# Patient Record
Sex: Male | Born: 1940 | Race: White | Hispanic: No | Smoking: Former smoker
Health system: Southern US, Community
[De-identification: ages and names within clinical notes are randomized; demographics above are authoritative.]

## PROBLEM LIST (undated history)

## (undated) DIAGNOSIS — N189 Chronic kidney disease, unspecified: Secondary | ICD-10-CM

## (undated) DIAGNOSIS — G4700 Insomnia, unspecified: Secondary | ICD-10-CM

## (undated) DIAGNOSIS — E039 Hypothyroidism, unspecified: Secondary | ICD-10-CM

## (undated) DIAGNOSIS — I10 Essential (primary) hypertension: Secondary | ICD-10-CM

## (undated) DIAGNOSIS — F419 Anxiety disorder, unspecified: Secondary | ICD-10-CM

## (undated) DIAGNOSIS — E785 Hyperlipidemia, unspecified: Secondary | ICD-10-CM

## (undated) DIAGNOSIS — I2510 Atherosclerotic heart disease of native coronary artery without angina pectoris: Secondary | ICD-10-CM

## (undated) DIAGNOSIS — I499 Cardiac arrhythmia, unspecified: Secondary | ICD-10-CM

## (undated) DIAGNOSIS — I219 Acute myocardial infarction, unspecified: Secondary | ICD-10-CM

## (undated) DIAGNOSIS — E079 Disorder of thyroid, unspecified: Secondary | ICD-10-CM

## (undated) HISTORY — DX: Insomnia, unspecified: G47.00

## (undated) HISTORY — PX: BACK SURGERY: SHX140

## (undated) HISTORY — DX: Chronic kidney disease, unspecified: N18.9

## (undated) HISTORY — DX: Hyperlipidemia, unspecified: E78.5

## (undated) HISTORY — DX: Acute myocardial infarction, unspecified: I21.9

## (undated) HISTORY — PX: CARDIAC CATHETERIZATION: SHX172

## (undated) HISTORY — DX: Anxiety disorder, unspecified: F41.9

## (undated) HISTORY — PX: CORONARY ANGIOPLASTY: SHX604

## (undated) HISTORY — DX: Disorder of thyroid, unspecified: E07.9

## (undated) HISTORY — PX: CORONARY STENT PLACEMENT: SHX1402

## (undated) HISTORY — DX: Essential (primary) hypertension: I10

---

## 2005-01-07 ENCOUNTER — Ambulatory Visit: Payer: Self-pay | Admitting: Ophthalmology

## 2005-06-25 ENCOUNTER — Ambulatory Visit: Payer: Self-pay | Admitting: Unknown Physician Specialty

## 2008-09-26 ENCOUNTER — Ambulatory Visit: Payer: Self-pay | Admitting: Neurological Surgery

## 2009-12-20 ENCOUNTER — Emergency Department: Payer: Self-pay | Admitting: Emergency Medicine

## 2010-04-23 ENCOUNTER — Ambulatory Visit: Payer: Self-pay | Admitting: Unknown Physician Specialty

## 2010-04-23 LAB — HM COLONOSCOPY

## 2010-10-02 ENCOUNTER — Ambulatory Visit: Payer: Self-pay | Admitting: Family Medicine

## 2010-10-09 ENCOUNTER — Ambulatory Visit: Payer: Self-pay

## 2013-01-05 ENCOUNTER — Ambulatory Visit: Payer: Self-pay | Admitting: Ophthalmology

## 2014-02-27 ENCOUNTER — Ambulatory Visit: Payer: Self-pay

## 2014-03-01 ENCOUNTER — Ambulatory Visit: Payer: Self-pay

## 2014-08-02 DIAGNOSIS — I129 Hypertensive chronic kidney disease with stage 1 through stage 4 chronic kidney disease, or unspecified chronic kidney disease: Secondary | ICD-10-CM | POA: Diagnosis not present

## 2014-08-02 DIAGNOSIS — E1129 Type 2 diabetes mellitus with other diabetic kidney complication: Secondary | ICD-10-CM | POA: Diagnosis not present

## 2014-08-02 LAB — PSA

## 2014-11-29 ENCOUNTER — Other Ambulatory Visit: Payer: Self-pay | Admitting: Unknown Physician Specialty

## 2014-11-29 DIAGNOSIS — E7800 Pure hypercholesterolemia, unspecified: Secondary | ICD-10-CM

## 2014-11-29 MED ORDER — ATORVASTATIN CALCIUM 40 MG PO TABS: 40.0000 mg | ORAL_TABLET | Freq: Every day | ORAL | Status: DC

## 2014-12-12 ENCOUNTER — Other Ambulatory Visit: Payer: Self-pay | Admitting: Unknown Physician Specialty

## 2015-02-13 ENCOUNTER — Other Ambulatory Visit: Payer: Self-pay | Admitting: Unknown Physician Specialty

## 2015-04-02 ENCOUNTER — Other Ambulatory Visit: Payer: Self-pay | Admitting: Unknown Physician Specialty

## 2015-04-17 ENCOUNTER — Other Ambulatory Visit: Payer: Self-pay | Admitting: Unknown Physician Specialty

## 2015-04-23 ENCOUNTER — Other Ambulatory Visit: Payer: Self-pay | Admitting: Unknown Physician Specialty

## 2015-04-23 NOTE — Telephone Encounter (Signed)
Needs an appointment. Will get him enough medicine to make it to appointment when it's booked.   

## 2015-04-23 NOTE — Telephone Encounter (Signed)
Called and scheduled patient an appointment for 05/01/15.

## 2015-04-30 DIAGNOSIS — F419 Anxiety disorder, unspecified: Secondary | ICD-10-CM

## 2015-04-30 DIAGNOSIS — I129 Hypertensive chronic kidney disease with stage 1 through stage 4 chronic kidney disease, or unspecified chronic kidney disease: Secondary | ICD-10-CM | POA: Insufficient documentation

## 2015-04-30 DIAGNOSIS — E039 Hypothyroidism, unspecified: Secondary | ICD-10-CM | POA: Insufficient documentation

## 2015-04-30 DIAGNOSIS — N1831 Chronic kidney disease, stage 3a: Secondary | ICD-10-CM | POA: Insufficient documentation

## 2015-04-30 DIAGNOSIS — E785 Hyperlipidemia, unspecified: Secondary | ICD-10-CM

## 2015-04-30 DIAGNOSIS — E119 Type 2 diabetes mellitus without complications: Secondary | ICD-10-CM | POA: Insufficient documentation

## 2015-04-30 DIAGNOSIS — N183 Chronic kidney disease, stage 3 unspecified: Secondary | ICD-10-CM

## 2015-04-30 DIAGNOSIS — I10 Essential (primary) hypertension: Secondary | ICD-10-CM

## 2015-04-30 DIAGNOSIS — E1122 Type 2 diabetes mellitus with diabetic chronic kidney disease: Secondary | ICD-10-CM

## 2015-04-30 DIAGNOSIS — G4700 Insomnia, unspecified: Secondary | ICD-10-CM

## 2015-05-01 ENCOUNTER — Encounter: Payer: Self-pay | Admitting: Unknown Physician Specialty

## 2015-05-01 ENCOUNTER — Ambulatory Visit (INDEPENDENT_AMBULATORY_CARE_PROVIDER_SITE_OTHER): Payer: Medicare Other | Admitting: Unknown Physician Specialty

## 2015-05-01 VITALS — BP 175/83 | HR 63 | Temp 98.2°F | Ht 66.5 in | Wt 158.0 lb

## 2015-05-01 DIAGNOSIS — N182 Chronic kidney disease, stage 2 (mild): Secondary | ICD-10-CM | POA: Diagnosis not present

## 2015-05-01 DIAGNOSIS — E785 Hyperlipidemia, unspecified: Secondary | ICD-10-CM | POA: Diagnosis not present

## 2015-05-01 DIAGNOSIS — I10 Essential (primary) hypertension: Secondary | ICD-10-CM | POA: Diagnosis not present

## 2015-05-01 DIAGNOSIS — Z0000 Encounter for general adult medical examination without abnormal findings: Secondary | ICD-10-CM | POA: Diagnosis not present

## 2015-05-01 DIAGNOSIS — N189 Chronic kidney disease, unspecified: Secondary | ICD-10-CM | POA: Insufficient documentation

## 2015-05-01 DIAGNOSIS — E1122 Type 2 diabetes mellitus with diabetic chronic kidney disease: Secondary | ICD-10-CM | POA: Diagnosis not present

## 2015-05-01 LAB — BAYER DCA HB A1C WAIVED: HB A1C (BAYER DCA - WAIVED): 6.1 % (ref <7.0)

## 2015-05-01 LAB — LIPID PANEL PICCOLO, WAIVED
CHOLESTEROL PICCOLO, WAIVED: 185 mg/dL (ref <200)
Chol/HDL Ratio Piccolo,Waive: 2.8 mg/dL
HDL Chol Piccolo, Waived: 65 mg/dL (ref >59)
LDL CHOL CALC PICCOLO WAIVED: 98 mg/dL (ref <100)
Triglycerides Piccolo,Waived: 111 mg/dL (ref <150)
VLDL Chol Calc Piccolo,Waive: 22 mg/dL (ref <30)

## 2015-05-01 LAB — MICROALBUMIN, URINE WAIVED
Creatinine, Urine Waived: 50 mg/dL (ref 10–300)
MICROALB, UR WAIVED: 30 mg/L — AB (ref 0–19)

## 2015-05-01 MED ORDER — AMLODIPINE BESYLATE 5 MG PO TABS: 5.0000 mg | ORAL_TABLET | Freq: Every day | ORAL | Status: DC

## 2015-05-01 MED ORDER — ATORVASTATIN CALCIUM 40 MG PO TABS: 40.0000 mg | ORAL_TABLET | Freq: Every day | ORAL | Status: DC

## 2015-05-01 MED ORDER — HYDROCHLOROTHIAZIDE 25 MG PO TABS: 25.0000 mg | ORAL_TABLET | Freq: Every day | ORAL | Status: DC

## 2015-05-01 MED ORDER — LISINOPRIL 40 MG PO TABS: 40.0000 mg | ORAL_TABLET | Freq: Every day | ORAL | Status: DC

## 2015-05-01 NOTE — Assessment & Plan Note (Signed)
Reviewed lipid panel.  LDL was 97.  Continue present medications.

## 2015-05-01 NOTE — Assessment & Plan Note (Signed)
Check CMP.  ?

## 2015-05-01 NOTE — Patient Instructions (Signed)
Start HCTZ 25 mg for your BP

## 2015-05-01 NOTE — Progress Notes (Signed)
BP 175/83 mmHg  Pulse 63  Temp(Src) 98.2 F (36.8 C)  Ht 5' 6.5" (1.689 m)  Wt 158 lb (71.668 kg)  BMI 25.12 kg/m2  SpO2 99%   Subjective:    Patient ID: Roger Byrd, male    DOB: 04/07/1941, 74 y.o.   MRN: [id number]  HPI: Roger Byrd is a 74 y.o. male  Chief Complaint  Patient presents with  . Hyperlipidemia  . Hypertension  . Medication Refill    pt states he needs all medications refilled   Diabetes:  Using medications without difficulties  Hypoglycemic episodes: none Hyperglycemic episodes: none Feet problems: none Blood Sugars averaging: not checking Eye exam 1-2 years ago  Hypertension:  Poor control today  Using medication without problems or lightheadedness Chest pain with exertion: no Edema: no Short of breath: no Monitoring BP at home?:no  Elevated Cholesterol: Using medications without problems Muscle aches: none Diet compliance:none Exercise: none Supplements?   Due for colonoscopy    Relevant past medical, surgical, family and social history reviewed and updated as indicated. Interim medical history since our last visit reviewed. Allergies and medications reviewed and updated.  Review of Systems  Constitutional: Negative.   HENT: Negative.   Eyes: Negative.   Respiratory: Negative.   Cardiovascular: Negative.   Gastrointestinal: Negative.   Endocrine: Negative.   Genitourinary: Negative.   Skin: Negative.   Allergic/Immunologic: Negative.   Neurological: Negative.   Hematological: Negative.   Psychiatric/Behavioral: Negative.     Per HPI unless specifically indicated above     Objective:    BP 175/83 mmHg  Pulse 63  Temp(Src) 98.2 F (36.8 C)  Ht 5' 6.5" (1.689 m)  Wt 158 lb (71.668 kg)  BMI 25.12 kg/m2  SpO2 99%  Wt Readings from Last 3 Encounters:  05/01/15 158 lb (71.668 kg)  08/02/14 162 lb (73.483 kg)    Physical Exam  Constitutional: He is oriented to person, place, and time. He appears well-developed  and well-nourished.  HENT:  Head: Normocephalic.  Eyes: Pupils are equal, round, and reactive to light.  Cardiovascular: Normal rate, regular rhythm and normal heart sounds.   Pulmonary/Chest: Effort normal and breath sounds normal.  Musculoskeletal: Normal range of motion.  Neurological: He is alert and oriented to person, place, and time. He has normal reflexes.  Skin: Skin is warm and dry.  Psychiatric: He has a normal mood and affect. His behavior is normal. Judgment and thought content normal.    Results for orders placed or performed in visit on 04/30/15  PSA  Result Value Ref Range   PSA from PP   HM COLONOSCOPY  Result Value Ref Range   HM Colonoscopy from PP       Assessment & Plan:   Problem List Items Addressed This Visit      Unprioritized   Diabetes mellitus ([place]) - Primary   Relevant Medications   atorvastatin (LIPITOR) 40 MG tablet   lisinopril (PRINIVIL,ZESTRIL) 40 MG tablet   Other Relevant Orders   Bayer DCA Hb A1c Waived   Comprehensive metabolic panel   Hypertension    Add HCTZ 25 mg      Relevant Medications   amLODipine (NORVASC) 5 MG tablet   atorvastatin (LIPITOR) 40 MG tablet   lisinopril (PRINIVIL,ZESTRIL) 40 MG tablet   hydrochlorothiazide (HYDRODIURIL) 25 MG tablet   Other Relevant Orders   Microalbumin, Urine Waived   Uric acid   Comprehensive metabolic panel   Hyperlipidemia    Reviewed lipid  panel.  LDL was 97.  Continue present medications.         Relevant Medications   amLODipine (NORVASC) 5 MG tablet   atorvastatin (LIPITOR) 40 MG tablet   lisinopril (PRINIVIL,ZESTRIL) 40 MG tablet   hydrochlorothiazide (HYDRODIURIL) 25 MG tablet   Other Relevant Orders   Lipid Panel Piccolo, Waived   CKD (chronic kidney disease)    Check CMP      Relevant Orders   Comprehensive metabolic panel    Other Visit Diagnoses    Health care maintenance        Relevant Orders    Ambulatory referral to Gastroenterology        Follow up  plan: Return in about 4 weeks (around 05/29/2015) for BP.

## 2015-05-01 NOTE — Assessment & Plan Note (Signed)
Add HCTZ 25mg

## 2015-05-02 LAB — COMPREHENSIVE METABOLIC PANEL
A/G RATIO: 2 (ref 1.1–2.5)
ALBUMIN: 4.4 g/dL (ref 3.5–4.8)
ALK PHOS: 61 IU/L (ref 39–117)
ALT: 18 IU/L (ref 0–44)
AST: 18 IU/L (ref 0–40)
BILIRUBIN TOTAL: 0.7 mg/dL (ref 0.0–1.2)
BUN / CREAT RATIO: 14 (ref 10–22)
BUN: 16 mg/dL (ref 8–27)
CO2: 24 mmol/L (ref 18–29)
CREATININE: 1.11 mg/dL (ref 0.76–1.27)
Calcium: 9.4 mg/dL (ref 8.6–10.2)
Chloride: 101 mmol/L (ref 97–106)
GFR calc Af Amer: 76 mL/min/{1.73_m2} (ref >59)
GFR calc non Af Amer: 66 mL/min/{1.73_m2} (ref >59)
GLOBULIN, TOTAL: 2.2 g/dL (ref 1.5–4.5)
Glucose: 117 mg/dL — ABNORMAL HIGH (ref 65–99)
POTASSIUM: 5.1 mmol/L (ref 3.5–5.2)
SODIUM: 140 mmol/L (ref 136–144)
Total Protein: 6.6 g/dL (ref 6.0–8.5)

## 2015-05-02 LAB — URIC ACID: Uric Acid: 4.9 mg/dL (ref 3.7–8.6)

## 2015-05-19 ENCOUNTER — Emergency Department
Admission: EM | Admit: 2015-05-19 | Discharge: 2015-05-19 | Disposition: A | Discharge status: Home / Self Care | Payer: Medicare Other | Attending: Emergency Medicine | Admitting: Emergency Medicine

## 2015-05-19 ENCOUNTER — Encounter: Payer: Self-pay | Admitting: Emergency Medicine

## 2015-05-19 DIAGNOSIS — W260XXA Contact with knife, initial encounter: Secondary | ICD-10-CM | POA: Insufficient documentation

## 2015-05-19 DIAGNOSIS — Z87891 Personal history of nicotine dependence: Secondary | ICD-10-CM | POA: Insufficient documentation

## 2015-05-19 DIAGNOSIS — Y9289 Other specified places as the place of occurrence of the external cause: Secondary | ICD-10-CM | POA: Diagnosis not present

## 2015-05-19 DIAGNOSIS — I129 Hypertensive chronic kidney disease with stage 1 through stage 4 chronic kidney disease, or unspecified chronic kidney disease: Secondary | ICD-10-CM | POA: Insufficient documentation

## 2015-05-19 DIAGNOSIS — E785 Hyperlipidemia, unspecified: Secondary | ICD-10-CM | POA: Insufficient documentation

## 2015-05-19 DIAGNOSIS — S61217A Laceration without foreign body of left little finger without damage to nail, initial encounter: Secondary | ICD-10-CM | POA: Diagnosis not present

## 2015-05-19 DIAGNOSIS — Z7982 Long term (current) use of aspirin: Secondary | ICD-10-CM | POA: Insufficient documentation

## 2015-05-19 DIAGNOSIS — Y998 Other external cause status: Secondary | ICD-10-CM | POA: Diagnosis not present

## 2015-05-19 DIAGNOSIS — E119 Type 2 diabetes mellitus without complications: Secondary | ICD-10-CM | POA: Diagnosis not present

## 2015-05-19 DIAGNOSIS — N189 Chronic kidney disease, unspecified: Secondary | ICD-10-CM | POA: Insufficient documentation

## 2015-05-19 DIAGNOSIS — Z79899 Other long term (current) drug therapy: Secondary | ICD-10-CM | POA: Insufficient documentation

## 2015-05-19 DIAGNOSIS — Y9389 Activity, other specified: Secondary | ICD-10-CM | POA: Diagnosis not present

## 2015-05-19 MED ORDER — TRAMADOL HCL 50 MG PO TABS: 50.0000 mg | ORAL_TABLET | Freq: Four times a day (QID) | ORAL | Status: DC | PRN

## 2015-05-19 MED ORDER — LIDOCAINE HCL (PF) 1 % IJ SOLN
INTRAMUSCULAR | Status: AC
  Filled 2015-05-19: qty 5

## 2015-05-19 MED ORDER — MICONAZOLE NITRATE 2 % EX CREA: TOPICAL_CREAM | Freq: Once | CUTANEOUS | Status: DC

## 2015-05-19 MED ORDER — BACITRACIN-NEOMYCIN-POLYMYXIN 400-5-5000 EX OINT
TOPICAL_OINTMENT | Freq: Once | CUTANEOUS | Status: AC
  Administered 2015-05-19: 1 via TOPICAL
  Filled 2015-05-19: qty 1

## 2015-05-19 NOTE — ED Notes (Signed)
Unable to stop bleeding at home, now has dressing with no bleed through

## 2015-05-19 NOTE — ED Provider Notes (Signed)
[facility] Emergency Department Provider Note  ____________________________________________  Time seen: Approximately 10:56 AM  I have reviewed the triage vital signs and the nursing notes.   HISTORY  Chief Complaint Laceration    HPI Roger Byrd is a 74 y.o. male issues with a knife cut to the dorsal aspect of the fifth digit left hand. Patient stated he was unable to control the bleeding with direct pressure went to the urgent care clinic date also unable to stop the bleeding. Patient states no prescription blood thinner but does take 81 mg aspirin daily. Patient's rate is pain discomfort as a 1/10. Denies any loss of sensation or loss of function. Pressure dressing was applied at the urgent care clinic. Patient's tetanus shot is up-to-date. She is right-hand dominant. Past Medical History  Diagnosis Date  . Diabetes mellitus without complication ([place])   . Hypertension   . Hyperlipidemia   . Anxiety   . Thyroid disease   . Insomnia   . Chronic kidney disease     Patient Active Problem List   Diagnosis Date Noted  . CKD (chronic kidney disease) 05/01/2015  . Diabetes mellitus ([place]) 04/30/2015  . Hypertension 04/30/2015  . Hyperlipidemia 04/30/2015  . Acute anxiety 04/30/2015  . Hypothyroidism 04/30/2015  . Insomnia 04/30/2015  . 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 Back surgery      x2    Current Outpatient Rx  Name  Route  Sig  Dispense  Refill  . amLODipine (NORVASC) 5 MG tablet   Oral   Take 1 tablet (5 mg total) by mouth daily.   90 tablet   1   . aspirin 81 MG tablet   Oral   Take 81 mg by mouth daily.         Marland Kitchen atorvastatin (LIPITOR) 40 MG tablet   Oral   Take 1 tablet (40 mg total) by mouth daily.   90 tablet   3   . hydrochlorothiazide (HYDRODIURIL) 25 MG tablet   Oral   Take 1 tablet (25 mg total) by mouth daily.   90 tablet   1   .  lisinopril (PRINIVIL,ZESTRIL) 40 MG tablet   Oral   Take 1 tablet (40 mg total) by mouth daily.   90 tablet   3     Allergies Review of patient's allergies indicates no known allergies.  Family History  Problem Relation Age of Onset  . Hypertension Mother   . Heart disease Mother     heart attack  . Hypertension Sister   . Emphysema Father     Social History Social History  Substance Use Topics  . Smoking status: Former Smoker    Quit date: 08/08/1970  . Smokeless tobacco: Never Used  . Alcohol Use: 8.4 oz/week    0 Standard drinks or equivalent, 14 Glasses of wine per week    Review of Systems Constitutional: No fever/chills Eyes: No visual changes. ENT: No sore throat. Cardiovascular: Denies chest pain. Respiratory: Denies shortness of breath. Gastrointestinal: No abdominal pain.  No nausea, no vomiting.  No diarrhea.  No constipation. Genitourinary: Negative for dysuria. Musculoskeletal: Negative for back pain. Skin: Negative for rash. Neurological: Negative for headaches, focal weakness or numbness. Endocrine:Diabetes, hyperlipidemia, and hypertension. 10-point ROS otherwise negative.  ____________________________________________   PHYSICAL EXAM:  VITAL SIGNS: ED Triage Vitals  Enc Vitals Group     BP 05/19/15 1028 134/68 mmHg  Pulse Rate 05/19/15 1028 67     Resp --      Temp 05/19/15 1028 98.4 F (36.9 C)     Temp Source 05/19/15 1028 Oral     SpO2 05/19/15 1028 96 %     Weight --      Height 05/19/15 1028 5\' 8"  (1.727 m)     Head Cir --      Peak Flow --      Pain Score 05/19/15 1031 1     Pain Loc --      Pain Edu? --      Excl. in [place]? --     Constitutional: Alert and oriented. Well appearing and in no acute distress. Eyes: Conjunctivae are normal. PERRL. EOMI. Head: Atraumatic. Nose: No congestion/rhinnorhea. Mouth/Throat: Mucous membranes are moist.  Oropharynx non-erythematous. Neck: No stridor. No cervical spine tenderness to  palpation. Hematological/Lymphatic/Immunilogical: No cervical lymphadenopathy. Cardiovascular: Normal rate, regular rhythm. Grossly normal heart sounds.  Good peripheral circulation. Respiratory: Normal respiratory effort.  No retractions. Lungs CTAB. Gastrointestinal: Soft and nontender. No distention. No abdominal bruits. No CVA tenderness. Musculoskeletal: No lower extremity tenderness nor edema.  No joint effusions. Neurologic:  Normal speech and language. No gross focal neurologic deficits are appreciated. No gait instability. Skin:  Skin is warm, dry and intact. No rash noted. Psychiatric: Mood and affect are normal. Speech and behavior are normal.  ____________________________________________   LABS (all labs ordered are listed, but only abnormal results are displayed)  Labs Reviewed - No data to display ____________________________________________  EKG   ____________________________________________  RADIOLOGY   ____________________________________________   PROCEDURES  Procedure(s) performed:   Critical Care performed: No  __________LACERATION REPAIR Performed by: Sable Feil Authorized by: Sable Feil Consent: Verbal consent obtained. Risks and benefits: risks, benefits and alternatives were discussed Consent given by: patient Patient identity confirmed: provided demographic data Prepped and Draped in normal sterile fashion Wound explored  Laceration Location: Fifth digit left.  Laceration Length: 1 cm  No Foreign Bodies seen or palpated  Anesthesia: Digital block   Local anesthetic: lidocaine 1% without epinephrine   Anesthetic total: All milliliters Irrigation method: syringe Amount of cleaning: standard  Skin closure: 4-0 nylon Number of sutures: 4   Technique: Interrupted Patient tolerance: Patient tolerated the procedure well with no immediate complications. __________________________________   INITIAL IMPRESSION / ASSESSMENT AND  PLAN / ED COURSE  Pertinent labs & imaging results that were available during my care of the patient were reviewed by me and considered in my medical decision making (see chart for details).  Laceration fifth digit left hand. Area was sutured. Patient given advice on home care. Patient given tramadol to take as needed for pain. Patient advised to have sutures removed in 10 days either by his PCP or disc Department. ____________________________________________   FINAL CLINICAL IMPRESSION(S) / ED DIAGNOSES  Final diagnoses:  Laceration of fifth finger, left, initial encounter       Sable Feil, PA-C 05/19/15 [place], PA-C 05/19/15 [place], MD 05/31/15 2325

## 2015-05-19 NOTE — Discharge Instructions (Signed)
Laceration Care, Adult  A laceration is a cut that goes through all layers of the skin. The cut also goes into the tissue that is right under the skin. Some cuts heal on their own. Others need to be closed with stitches (sutures), staples, skin adhesive strips, or wound glue. Taking care of your cut lowers your risk of infection and helps your cut to heal better.  HOW TO TAKE CARE OF YOUR CUT  For stitches or staples:  · Keep the wound clean and dry.  · If you were given a bandage (dressing), you should change it at least one time per day or as told by your doctor. You should also change it if it gets wet or dirty.  · Keep the wound completely dry for the first 24 hours or as told by your doctor. After that time, you may take a shower or a bath. However, make sure that the wound is not soaked in water until after the stitches or staples have been removed.  · Clean the wound one time each day or as told by your doctor:    Wash the wound with soap and water.    Rinse the wound with water until all of the soap comes off.    Pat the wound dry with a clean towel. Do not rub the wound.  · After you clean the wound, put a thin layer of antibiotic ointment on it as told by your doctor. This ointment:    Helps to prevent infection.    Keeps the bandage from sticking to the wound.  · Have your stitches or staples removed as told by your doctor.  If your doctor used skin adhesive strips:   · Keep the wound clean and dry.  · If you were given a bandage, you should change it at least one time per day or as told by your doctor. You should also change it if it gets dirty or wet.  · Do not get the skin adhesive strips wet. You can take a shower or a bath, but be careful to keep the wound dry.  · If the wound gets wet, pat it dry with a clean towel. Do not rub the wound.  · Skin adhesive strips fall off on their own. You can trim the strips as the wound heals. Do not remove any strips that are still stuck to the wound. They will  fall off after a while.  If your doctor used wound glue:  · Try to keep your wound dry, but you may briefly wet it in the shower or bath. Do not soak the wound in water, such as by swimming.  · After you take a shower or a bath, gently pat the wound dry with a clean towel. Do not rub the wound.  · Do not do any activities that will make you really sweaty until the skin glue has fallen off on its own.  · Do not apply liquid, cream, or ointment medicine to your wound while the skin glue is still on.  · If you were given a bandage, you should change it at least one time per day or as told by your doctor. You should also change it if it gets dirty or wet.  · If a bandage is placed over the wound, do not let the tape for the bandage touch the skin glue.  · Do not pick at the glue. The skin glue usually stays on for 5-10 days. Then, it   falls off of the skin.  General Instructions   · To help prevent scarring, make sure to cover your wound with sunscreen whenever you are outside after stitches are removed, after adhesive strips are removed, or when wound glue stays in place and the wound is healed. Make sure to wear a sunscreen of at least 30 SPF.  · Take over-the-counter and prescription medicines only as told by your doctor.  · If you were given antibiotic medicine or ointment, take or apply it as told by your doctor. Do not stop using the antibiotic even if your wound is getting better.  · Do not scratch or pick at the wound.  · Keep all follow-up visits as told by your doctor. This is important.  · Check your wound every day for signs of infection. Watch for:    Redness, swelling, or pain.    Fluid, blood, or pus.  · Raise (elevate) the injured area above the level of your heart while you are sitting or lying down, if possible.  GET HELP IF:  · You got a tetanus shot and you have any of these problems at the injection site:    Swelling.    Very bad pain.    Redness.    Bleeding.  · You have a fever.  · A wound that was  closed breaks open.  · You notice a bad smell coming from your wound or your bandage.  · You notice something coming out of the wound, such as wood or glass.  · Medicine does not help your pain.  · You have more redness, swelling, or pain at the site of your wound.  · You have fluid, blood, or pus coming from your wound.  · You notice a change in the color of your skin near your wound.  · You need to change the bandage often because fluid, blood, or pus is coming from the wound.  · You start to have a new rash.  · You start to have numbness around the wound.  GET HELP RIGHT AWAY IF:  · You have very bad swelling around the wound.  · Your pain suddenly gets worse and is very bad.  · You notice painful lumps near the wound or on skin that is anywhere on your body.  · You have a red streak going away from your wound.  · The wound is on your hand or foot and you cannot move a finger or toe like you usually can.  · The wound is on your hand or foot and you notice that your fingers or toes look pale or bluish.     This information is not intended to replace advice given to you by your health care provider. Make sure you discuss any questions you have with your health care provider.     Document Released: 12/03/2007 Document Revised: 10/31/2014 Document Reviewed: 06/12/2014  Elsevier Interactive Patient Education ©2016 [employer]

## 2015-05-19 NOTE — ED Notes (Signed)
NAD noted at this time. Pt refused wheelchair to the lobby. Pt denies comments/concerns.

## 2015-05-29 ENCOUNTER — Encounter: Payer: Self-pay | Admitting: Unknown Physician Specialty

## 2015-05-29 ENCOUNTER — Ambulatory Visit (INDEPENDENT_AMBULATORY_CARE_PROVIDER_SITE_OTHER): Payer: Medicare Other | Admitting: Unknown Physician Specialty

## 2015-05-29 VITALS — BP 120/65 | HR 78 | Temp 98.4°F | Ht 67.3 in | Wt 160.0 lb

## 2015-05-29 DIAGNOSIS — Z4802 Encounter for removal of sutures: Secondary | ICD-10-CM | POA: Diagnosis not present

## 2015-05-29 NOTE — Progress Notes (Signed)
BP 120/65 mmHg  Pulse 78  Temp(Src) 98.4 F (36.9 C)  Ht 5' 7.3" (1.709 m)  Wt 160 lb (72.576 kg)  BMI 24.85 kg/m2  SpO2 98%   Subjective:    Patient ID: Roger Byrd, male    DOB: 1941/02/25, 74 y.o.   MRN: [id number]  HPI: Roger Byrd is a 73 y.o. male  Chief Complaint  Patient presents with  . Suture / Staple Removal    pt states he is here to have stitches removed from finger   Pt cut finger on a knife 2 weeks ago.  Sutures placed in the ER.  He is here to have them removed  Relevant past medical, surgical, family and social history reviewed and updated as indicated. Interim medical history since our last visit reviewed. Allergies and medications reviewed and updated.  Review of Systems  Per HPI unless specifically indicated above     Objective:    BP 120/65 mmHg  Pulse 78  Temp(Src) 98.4 F (36.9 C)  Ht 5' 7.3" (1.709 m)  Wt 160 lb (72.576 kg)  BMI 24.85 kg/m2  SpO2 98%  Wt Readings from Last 3 Encounters:  05/29/15 160 lb (72.576 kg)  05/01/15 158 lb (71.668 kg)  08/02/14 162 lb (73.483 kg)    Physical Exam  Constitutional: He is oriented to person, place, and time. He appears well-developed and well-nourished. No distress.  HENT:  Head: Normocephalic and atraumatic.  Eyes: Conjunctivae and lids are normal. Right eye exhibits no discharge. Left eye exhibits no discharge. No scleral icterus.  Cardiovascular: Normal rate.   Pulmonary/Chest: Effort normal.  Abdominal: Normal appearance. There is no splenomegaly or hepatomegaly.  Musculoskeletal: Normal range of motion.  Neurological: He is alert and oriented to person, place, and time.  Skin: Skin is intact. No rash noted. No pallor.  4 sutures removed.  Neosporin and band aid applied.  Edges well approximated  Psychiatric: He has a normal mood and affect. His behavior is normal. Judgment and thought content normal.    Results for orders placed or performed in visit on 05/01/15  Microalbumin,  Urine Waived  Result Value Ref Range   Microalb, Ur Waived 30 (H) 0 - 19 mg/L   Creatinine, Urine Waived 50 10 - 300 mg/dL   Microalb/Creat Ratio 30-300 (H) <30 mg/g  Uric acid  Result Value Ref Range   Uric Acid 4.9 3.7 - 8.6 mg/dL  Comprehensive metabolic panel  Result Value Ref Range   Glucose 117 (H) 65 - 99 mg/dL   BUN 16 8 - 27 mg/dL   Creatinine, Ser 1.11 0.76 - 1.27 mg/dL   GFR calc non Af Amer 66 >59 mL/min/1.73   GFR calc Af Amer 76 >59 mL/min/1.73   BUN/Creatinine Ratio 14 10 - 22   Sodium 140 136 - 144 mmol/L   Potassium 5.1 3.5 - 5.2 mmol/L   Chloride 101 97 - 106 mmol/L   CO2 24 18 - 29 mmol/L   Calcium 9.4 8.6 - 10.2 mg/dL   Total Protein 6.6 6.0 - 8.5 g/dL   Albumin 4.4 3.5 - 4.8 g/dL   Globulin, Total 2.2 1.5 - 4.5 g/dL   Albumin/Globulin Ratio 2.0 1.1 - 2.5   Bilirubin Total 0.7 0.0 - 1.2 mg/dL   Alkaline Phosphatase 61 39 - 117 IU/L   AST 18 0 - 40 IU/L   ALT 18 0 - 44 IU/L  Lipid Panel Piccolo, Waived  Result Value Ref Range   Cholesterol  Piccolo, Waived 185 <200 mg/dL   HDL Chol Piccolo, Waived 65 >59 mg/dL   Triglycerides Piccolo,Waived 111 <150 mg/dL   Chol/HDL Ratio Piccolo,Waive 2.8 mg/dL   LDL Chol Calc Piccolo Waived 98 <100 mg/dL   VLDL Chol Calc Piccolo,Waive 22 <30 mg/dL  Bayer DCA Hb A1c Waived  Result Value Ref Range   Bayer DCA Hb A1c Waived 6.1 <7.0 %      Assessment & Plan:   Problem List Items Addressed This Visit    None    Visit Diagnoses    Visit for suture removal    -  Primary        Follow up plan: Return if symptoms worsen or fail to improve.

## 2015-06-04 ENCOUNTER — Ambulatory Visit: Payer: Medicare Other | Admitting: Unknown Physician Specialty

## 2015-07-19 ENCOUNTER — Ambulatory Visit: Payer: Medicare Other | Admitting: Certified Registered Nurse Anesthetist

## 2015-07-19 ENCOUNTER — Ambulatory Visit
Admission: RE | Admit: 2015-07-19 | Discharge: 2015-07-19 | Disposition: A | Discharge status: Home / Self Care | Payer: Medicare Other | Source: Ambulatory Visit | Attending: Unknown Physician Specialty | Admitting: Unknown Physician Specialty

## 2015-07-19 ENCOUNTER — Encounter: Payer: Self-pay | Admitting: *Deleted

## 2015-07-19 ENCOUNTER — Encounter
Admission: RE | Disposition: A | Discharge status: Home / Self Care | Payer: Self-pay | Source: Ambulatory Visit | Attending: Unknown Physician Specialty

## 2015-07-19 DIAGNOSIS — Z8249 Family history of ischemic heart disease and other diseases of the circulatory system: Secondary | ICD-10-CM | POA: Insufficient documentation

## 2015-07-19 DIAGNOSIS — Z79899 Other long term (current) drug therapy: Secondary | ICD-10-CM | POA: Insufficient documentation

## 2015-07-19 DIAGNOSIS — Z7982 Long term (current) use of aspirin: Secondary | ICD-10-CM | POA: Insufficient documentation

## 2015-07-19 DIAGNOSIS — K648 Other hemorrhoids: Secondary | ICD-10-CM | POA: Diagnosis not present

## 2015-07-19 DIAGNOSIS — D122 Benign neoplasm of ascending colon: Secondary | ICD-10-CM | POA: Insufficient documentation

## 2015-07-19 DIAGNOSIS — Z87891 Personal history of nicotine dependence: Secondary | ICD-10-CM | POA: Insufficient documentation

## 2015-07-19 DIAGNOSIS — F419 Anxiety disorder, unspecified: Secondary | ICD-10-CM | POA: Diagnosis not present

## 2015-07-19 DIAGNOSIS — K621 Rectal polyp: Secondary | ICD-10-CM | POA: Diagnosis not present

## 2015-07-19 DIAGNOSIS — N189 Chronic kidney disease, unspecified: Secondary | ICD-10-CM | POA: Diagnosis not present

## 2015-07-19 DIAGNOSIS — G4700 Insomnia, unspecified: Secondary | ICD-10-CM | POA: Diagnosis not present

## 2015-07-19 DIAGNOSIS — I129 Hypertensive chronic kidney disease with stage 1 through stage 4 chronic kidney disease, or unspecified chronic kidney disease: Secondary | ICD-10-CM | POA: Diagnosis not present

## 2015-07-19 DIAGNOSIS — Z86010 Personal history of colonic polyps: Secondary | ICD-10-CM | POA: Diagnosis not present

## 2015-07-19 DIAGNOSIS — E785 Hyperlipidemia, unspecified: Secondary | ICD-10-CM | POA: Insufficient documentation

## 2015-07-19 DIAGNOSIS — E079 Disorder of thyroid, unspecified: Secondary | ICD-10-CM | POA: Diagnosis not present

## 2015-07-19 DIAGNOSIS — Z825 Family history of asthma and other chronic lower respiratory diseases: Secondary | ICD-10-CM | POA: Diagnosis not present

## 2015-07-19 DIAGNOSIS — K640 First degree hemorrhoids: Secondary | ICD-10-CM | POA: Insufficient documentation

## 2015-07-19 DIAGNOSIS — Z1211 Encounter for screening for malignant neoplasm of colon: Secondary | ICD-10-CM | POA: Insufficient documentation

## 2015-07-19 DIAGNOSIS — D128 Benign neoplasm of rectum: Secondary | ICD-10-CM | POA: Diagnosis not present

## 2015-07-19 DIAGNOSIS — K635 Polyp of colon: Secondary | ICD-10-CM | POA: Diagnosis not present

## 2015-07-19 HISTORY — PX: COLONOSCOPY WITH PROPOFOL: SHX5780

## 2015-07-19 SURGERY — COLONOSCOPY WITH PROPOFOL
Anesthesia: General

## 2015-07-19 MED ORDER — LIDOCAINE HCL (CARDIAC) 20 MG/ML IV SOLN
INTRAVENOUS | Status: DC | PRN
  Administered 2015-07-19: 60 mg via INTRAVENOUS

## 2015-07-19 MED ORDER — MIDAZOLAM HCL 2 MG/2ML IJ SOLN
INTRAMUSCULAR | Status: DC | PRN
  Administered 2015-07-19: 1 mg via INTRAVENOUS

## 2015-07-19 MED ORDER — PROPOFOL 500 MG/50ML IV EMUL
INTRAVENOUS | Status: DC | PRN
  Administered 2015-07-19: 140 ug/kg/min via INTRAVENOUS

## 2015-07-19 MED ORDER — SODIUM CHLORIDE 0.9 % IV SOLN
INTRAVENOUS | Status: DC
  Administered 2015-07-19: 08:00:00 via INTRAVENOUS

## 2015-07-19 MED ORDER — SODIUM CHLORIDE 0.9 % IV SOLN: INTRAVENOUS | Status: DC

## 2015-07-19 MED ORDER — PHENYLEPHRINE HCL 10 MG/ML IJ SOLN
INTRAMUSCULAR | Status: DC | PRN
  Administered 2015-07-19 (×2): 100 ug via INTRAVENOUS

## 2015-07-19 NOTE — Anesthesia Postprocedure Evaluation (Signed)
Anesthesia Post Note  Patient: Roger Byrd  Procedure(s) Performed: Procedure(s) (LRB): COLONOSCOPY WITH PROPOFOL (N/A)  Patient location during evaluation: PACU Anesthesia Type: General Level of consciousness: awake and alert Pain management: pain level controlled Vital Signs Assessment: post-procedure vital signs reviewed and stable Respiratory status: spontaneous breathing and respiratory function stable Cardiovascular status: stable Anesthetic complications: no    Last Vitals:  Filed Vitals:   07/19/15 0822 07/19/15 0832  BP: 80/48 81/58  Pulse: 71 58  Temp: 35.7 C   Resp: 18 15    Last Pain: There were no vitals filed for this visit.               Kayen Grabel K

## 2015-07-19 NOTE — H&P (Signed)
   Primary Care Physician:  Kathrine Haddock, NP Primary Gastroenterologist:  Dr. Vira Agar  Pre-Procedure History & Physical: HPI:  Roger Byrd is a 75 y.o. male is here for an colonoscopy.   Past Medical History  Diagnosis Date  . Hypertension   . Hyperlipidemia   . Anxiety   . Thyroid disease   . Insomnia   . Chronic kidney disease     Past Surgical History  Procedure Laterality Date  . Back surgery      x2    Prior to Admission medications   Medication Sig Start Date End Date Taking? Authorizing Provider  amLODipine (NORVASC) 5 MG tablet Take 1 tablet (5 mg total) by mouth daily. 05/01/15  Yes Kathrine Haddock, NP  aspirin 81 MG tablet Take 81 mg by mouth daily.   Yes Historical Provider, MD  atorvastatin (LIPITOR) 40 MG tablet Take 1 tablet (40 mg total) by mouth daily. 05/01/15  Yes Kathrine Haddock, NP  hydrochlorothiazide (HYDRODIURIL) 25 MG tablet Take 1 tablet (25 mg total) by mouth daily. 05/01/15  Yes Kathrine Haddock, NP  lisinopril (PRINIVIL,ZESTRIL) 40 MG tablet Take 1 tablet (40 mg total) by mouth daily. 05/01/15  Yes Kathrine Haddock, NP  traMADol (ULTRAM) 50 MG tablet Take 1 tablet (50 mg total) by mouth every 6 (six) hours as needed for moderate pain. 05/19/15   Sable Feil, PA-C    Allergies as of 06/13/2015  . (No Known Allergies)    Family History  Problem Relation Age of Onset  . Hypertension Mother   . Heart disease Mother     heart attack  . Hypertension Sister   . Emphysema Father     Social History   Social History  . Marital Status: Married    Spouse Name: N/A  . Number of Children: N/A  . Years of Education: N/A   Occupational History  . Not on file.   Social History Main Topics  . Smoking status: Former Smoker    Quit date: 08/08/1970  . Smokeless tobacco: Never Used  . Alcohol Use: 8.4 oz/week    0 Standard drinks or equivalent, 14 Glasses of wine per week  . Drug Use: No  . Sexual Activity: Not Currently   Other Topics Concern  .  Not on file   Social History Narrative    Review of Systems: See HPI, otherwise negative ROS  Physical Exam: BP 94/55 mmHg  Pulse 88  Temp(Src) 97.7 F (36.5 C) (Tympanic)  Resp 14  Ht 5\' 8"  (1.727 m)  Wt 72.576 kg (160 lb)  BMI 24.33 kg/m2  SpO2 100% General:   Alert,  pleasant and cooperative in NAD Head:  Normocephalic and atraumatic. Neck:  Supple; no masses or thyromegaly. Lungs:  Clear throughout to auscultation.    Heart:  Regular rate and rhythm. Abdomen:  Soft, nontender and nondistended. Normal bowel sounds, without guarding, and without rebound.   Neurologic:  Alert and  oriented x4;  grossly normal neurologically.  Impression/Plan: Roger Byrd is here for an colonoscopy to be performed for [facility] colon polyps  Risks, benefits, limitations, and alternatives regarding  colonoscopy have been reviewed with the patient.  Questions have been answered.  All parties agreeable.   Gaylyn Cheers, MD  07/19/2015, 7:48 AM

## 2015-07-19 NOTE — Transfer of Care (Signed)
Immediate Anesthesia Transfer of Care Note  Patient: Roger Byrd  Procedure(s) Performed: Procedure(s): COLONOSCOPY WITH PROPOFOL (N/A)  Patient Location: PACU  Anesthesia Type:General  Level of Consciousness: awake and alert   Airway & Oxygen Therapy: Patient Spontanous Breathing and Patient connected to nasal cannula oxygen  Post-op Assessment: Report given to RN and Post -op Vital signs reviewed and stable  Post vital signs: Reviewed and stable  Last Vitals:  Filed Vitals:   07/19/15 0822 07/19/15 0827  BP: 80/48   Pulse: 71   Temp: 35.7 C 36.1 C  Resp: 18     Complications: No apparent anesthesia complications

## 2015-07-19 NOTE — Anesthesia Procedure Notes (Signed)
Date/Time: 07/19/2015 7:59 AM Performed by: Johnna Acosta Pre-anesthesia Checklist: Patient identified, Emergency Drugs available, Suction available and Patient being monitored Patient Re-evaluated:Patient Re-evaluated prior to inductionOxygen Delivery Method: Nasal cannula

## 2015-07-19 NOTE — Anesthesia Preprocedure Evaluation (Signed)
Anesthesia Evaluation  Patient identified by MRN, date of birth, ID band Patient awake    Reviewed: Allergy & Precautions, NPO status , Patient's Chart, lab work & pertinent test results  History of Anesthesia Complications Negative for: history of anesthetic complications  Airway Mallampati: I       Dental  (+) Partial Lower, Upper Dentures   Pulmonary neg pulmonary ROS, former smoker,           Cardiovascular hypertension, Pt. on medications      Neuro/Psych Anxiety    GI/Hepatic negative GI ROS, Neg liver ROS,   Endo/Other  negative endocrine ROSneg diabetes  Renal/GU Renal diseasenegative Renal ROS     Musculoskeletal   Abdominal   Peds  Hematology negative hematology ROS (+)   Anesthesia Other Findings   Reproductive/Obstetrics                             Anesthesia Physical Anesthesia Plan  ASA: II  Anesthesia Plan: General   Post-op Pain Management:    Induction: Intravenous  Airway Management Planned:   Additional Equipment:   Intra-op Plan:   Post-operative Plan:   Informed Consent: I have reviewed the patients History and Physical, chart, labs and discussed the procedure including the risks, benefits and alternatives for the proposed anesthesia with the patient or authorized representative who has indicated his/her understanding and acceptance.     Plan Discussed with:   Anesthesia Plan Comments:         Anesthesia Quick Evaluation

## 2015-07-19 NOTE — Op Note (Signed)
[name] Gastroenterology Patient Name: Roger Byrd Procedure Date: 07/19/2015 7:53 AM MRN: [id number] Account #: [id number] Date of Birth: [birth year] Admit Type: Outpatient Age: 75 Room: [facility] ENDO ROOM 1 Gender: Male Note Status: Finalized Procedure:         Colonoscopy Indications:       High risk colon cancer surveillance: Personal history of                     colonic polyps Providers:         Manya Silvas, MD Medicines:         Propofol per Anesthesia Complications:     No immediate complications. Procedure:         Pre-Anesthesia Assessment:                    - After reviewing the risks and benefits, the patient was                     deemed in satisfactory condition to undergo the procedure.                    After obtaining informed consent, the colonoscope was                     passed under direct vision. Throughout the procedure, the                     patient's blood pressure, pulse, and oxygen saturations                     were monitored continuously. The Colonoscope was                     introduced through the anus and advanced to the the cecum,                     identified by appendiceal orifice and ileocecal valve. The                     colonoscopy was performed without difficulty. The patient                     tolerated the procedure well. The quality of the bowel                     preparation was good. Findings:      Three sessile polyps were found in the rectum and in the ascending       colon. The polyps were diminutive in size. These polyps were removed       with a jumbo cold forceps. Resection and retrieval were complete.      Internal hemorrhoids were found during endoscopy. The hemorrhoids were       small and Grade I (internal hemorrhoids that do not prolapse).      The exam was otherwise without abnormality. Impression:        - Three diminutive polyps in the rectum and in the                     ascending  colon. Resected and retrieved.                    - Internal hemorrhoids.                    -  The examination was otherwise normal. Recommendation:    - Await pathology results. Manya Silvas, MD 07/19/2015 8:20:21 AM This report has been signed electronically. Number of Addenda: 0 Note Initiated On: 07/19/2015 7:53 AM Scope Withdrawal Time: 0 hours 9 minutes 44 seconds  Total Procedure Duration: 0 hours 15 minutes 10 seconds       [facility]

## 2015-07-20 LAB — SURGICAL PATHOLOGY

## 2015-07-23 ENCOUNTER — Encounter: Payer: Self-pay | Admitting: Unknown Physician Specialty

## 2015-09-03 ENCOUNTER — Telehealth: Payer: Self-pay | Admitting: Unknown Physician Specialty

## 2015-09-03 NOTE — Telephone Encounter (Signed)
Pt states he is "off balance" in the morning.  He went to the walk-in clinic and didn't get anything and told it was vertigo.  He is taking OTC motion sickness pills which is helping which is 25 mg of Meclizine.  Discussed with pt this is the treatment I would give him

## 2015-09-03 NOTE — Telephone Encounter (Signed)
Patient called stating that he was feeling dizzy. I told him I didn't have any appointments available until tomorrow, therefore he said he wanted to talk to [facility], if she can call him back, thanks.

## 2015-09-03 NOTE — Telephone Encounter (Signed)
Routing to Cheryl

## 2015-09-05 ENCOUNTER — Encounter: Payer: Self-pay | Admitting: Unknown Physician Specialty

## 2015-09-05 ENCOUNTER — Ambulatory Visit (INDEPENDENT_AMBULATORY_CARE_PROVIDER_SITE_OTHER): Payer: Medicare Other | Admitting: Unknown Physician Specialty

## 2015-09-05 VITALS — BP 128/71 | HR 67 | Temp 97.8°F | Ht 66.1 in | Wt 155.6 lb

## 2015-09-05 DIAGNOSIS — H8110 Benign paroxysmal vertigo, unspecified ear: Secondary | ICD-10-CM

## 2015-09-05 NOTE — Progress Notes (Signed)
   BP 128/71 mmHg  Pulse 67  Temp(Src) 97.8 F (36.6 C)  Ht 5' 6.1" (1.679 m)  Wt 155 lb 9.6 oz (70.58 kg)  BMI 25.04 kg/m2  SpO2 99%   Subjective:    Patient ID: Roger Byrd, male    DOB: [birth year], 75 y.o.   MRN: [id number]  HPI: Roger Byrd is a 75 y.o. male  Chief Complaint  Patient presents with  . balance    pt states he has felt off balance since Friday morning and states his ears have been ringing   Dizziness This is a new problem. The current episode started in the past 7 days. The problem has been gradually improving. Associated symptoms include vertigo. Pertinent negatives include no fatigue, fever, nausea or visual change. Exacerbated by: movement. Treatments tried: OTC Meclizine.     Relevant past medical, surgical, family and social history reviewed and updated as indicated. Interim medical history since our last visit reviewed. Allergies and medications reviewed and updated.  Review of Systems  Constitutional: Negative for fever and fatigue.  Gastrointestinal: Negative for nausea.  Neurological: Positive for dizziness and vertigo.    Per HPI unless specifically indicated above     Objective:    BP 128/71 mmHg  Pulse 67  Temp(Src) 97.8 F (36.6 C)  Ht 5' 6.1" (1.679 m)  Wt 155 lb 9.6 oz (70.58 kg)  BMI 25.04 kg/m2  SpO2 99%  Wt Readings from Last 3 Encounters:  09/05/15 155 lb 9.6 oz (70.58 kg)  07/19/15 160 lb (72.576 kg)  05/29/15 160 lb (72.576 kg)    Physical Exam  Constitutional: He is oriented to person, place, and time. He appears well-developed and well-nourished. No distress.  HENT:  Head: Normocephalic and atraumatic.  Eyes: Conjunctivae and lids are normal. Right eye exhibits no discharge. Left eye exhibits no discharge. No scleral icterus.  Neck: Normal range of motion. Neck supple. No JVD present. Carotid bruit is not present.  Cardiovascular: Normal rate, regular rhythm and normal heart sounds.   Pulmonary/Chest: Effort  normal and breath sounds normal. No respiratory distress.  Abdominal: Normal appearance. There is no splenomegaly or hepatomegaly.  Musculoskeletal: Normal range of motion.  Neurological: He is alert and oriented to person, place, and time.  Skin: Skin is warm, dry and intact. No rash noted. No pallor.  Psychiatric: He has a normal mood and affect. His behavior is normal. Judgment and thought content normal.      Assessment & Plan:   Problem List Items Addressed This Visit    None    Visit Diagnoses    BPV (benign positional vertigo), unspecified laterality    -  Primary    Pt ed.  Discussed with pt to continue Meclizine Discussed slef-limited nature of a typical vertigo episode        Follow up plan: Return for re check in May.

## 2015-09-05 NOTE — Patient Instructions (Signed)
Benign Positional Vertigo Vertigo is the feeling that you or your surroundings are moving when they are not. Benign positional vertigo is the most common form of vertigo. The cause of this condition is not serious (is benign). This condition is triggered by certain movements and positions (is positional). This condition can be dangerous if it occurs while you are doing something that could endanger you or others, such as driving.  CAUSES In many cases, the cause of this condition is not known. It may be caused by a disturbance in an area of the inner ear that helps your brain to sense movement and balance. This disturbance can be caused by a viral infection (labyrinthitis), head injury, or repetitive motion. RISK FACTORS This condition is more likely to develop in:  Women.  People who are 50 years of age or older. SYMPTOMS Symptoms of this condition usually happen when you move your head or your eyes in different directions. Symptoms may start suddenly, and they usually last for less than a minute. Symptoms may include:  Loss of balance and falling.  Feeling like you are spinning or moving.  Feeling like your surroundings are spinning or moving.  Nausea and vomiting.  Blurred vision.  Dizziness.  Involuntary eye movement (nystagmus). Symptoms can be mild and cause only slight annoyance, or they can be severe and interfere with daily life. Episodes of benign positional vertigo may return (recur) over time, and they may be triggered by certain movements. Symptoms may improve over time. DIAGNOSIS This condition is usually diagnosed by medical history and a physical exam of the head, neck, and ears. You may be referred to a health care provider who specializes in ear, nose, and throat (ENT) problems (otolaryngologist) or a provider who specializes in disorders of the nervous system (neurologist). You may have additional testing, including:  MRI.  A CT scan.  Eye movement tests. Your  health care provider may ask you to change positions quickly while he or she watches you for symptoms of benign positional vertigo, such as nystagmus. Eye movement may be tested with an electronystagmogram (ENG), caloric stimulation, the Dix-Hallpike test, or the roll test.  An electroencephalogram (EEG). This records electrical activity in your brain.  Hearing tests. TREATMENT Usually, your health care provider will treat this by moving your head in specific positions to adjust your inner ear back to normal. Surgery may be needed in severe cases, but this is rare. In some cases, benign positional vertigo may resolve on its own in 2-4 weeks. HOME CARE INSTRUCTIONS Safety  Move slowly.Avoid sudden body or head movements.  Avoid driving.  Avoid operating heavy machinery.  Avoid doing any tasks that would be dangerous to you or others if a vertigo episode would occur.  If you have trouble walking or keeping your balance, try using a cane for stability. If you feel dizzy or unstable, sit down right away.  Return to your normal activities as told by your health care provider. Ask your health care provider what activities are safe for you. General Instructions  Take over-the-counter and prescription medicines only as told by your health care provider.  Avoid certain positions or movements as told by your health care provider.  Drink enough fluid to keep your urine clear or pale yellow.  Keep all follow-up visits as told by your health care provider. This is important. SEEK MEDICAL CARE IF:  You have a fever.  Your condition gets worse or you develop new symptoms.  Your family or friends   notice any behavioral changes.  Your nausea or vomiting gets worse.  You have numbness or a "pins and needles" sensation. SEEK IMMEDIATE MEDICAL CARE IF:  You have difficulty speaking or moving.  You are always dizzy.  You faint.  You develop severe headaches.  You have weakness in your  legs or arms.  You have changes in your hearing or vision.  You develop a stiff neck.  You develop sensitivity to light.   This information is not intended to replace advice given to you by your health care provider. Make sure you discuss any questions you have with your health care provider.   Document Released: 03/24/2006 Document Revised: 03/07/2015 Document Reviewed: 10/09/2014 Elsevier Interactive Patient Education 2016 [employer]  

## 2015-10-15 ENCOUNTER — Other Ambulatory Visit: Payer: Self-pay | Admitting: Unknown Physician Specialty

## 2015-10-22 ENCOUNTER — Other Ambulatory Visit: Payer: Self-pay | Admitting: Unknown Physician Specialty

## 2015-11-21 ENCOUNTER — Ambulatory Visit (INDEPENDENT_AMBULATORY_CARE_PROVIDER_SITE_OTHER): Payer: Medicare Other | Admitting: Unknown Physician Specialty

## 2015-11-21 ENCOUNTER — Encounter: Payer: Self-pay | Admitting: Unknown Physician Specialty

## 2015-11-21 VITALS — BP 136/69 | HR 73 | Temp 97.6°F | Ht 66.5 in | Wt 157.8 lb

## 2015-11-21 DIAGNOSIS — E785 Hyperlipidemia, unspecified: Secondary | ICD-10-CM | POA: Diagnosis not present

## 2015-11-21 DIAGNOSIS — R7301 Impaired fasting glucose: Secondary | ICD-10-CM

## 2015-11-21 DIAGNOSIS — I10 Essential (primary) hypertension: Secondary | ICD-10-CM | POA: Diagnosis not present

## 2015-11-21 LAB — BAYER DCA HB A1C WAIVED: HB A1C (BAYER DCA - WAIVED): 6.2 % (ref <7.0)

## 2015-11-21 NOTE — Progress Notes (Signed)
   BP 136/69 mmHg  Pulse 73  Temp(Src) 97.6 F (36.4 C)  Ht 5' 6.5" (1.689 m)  Wt 157 lb 12.8 oz (71.578 kg)  BMI 25.09 kg/m2  SpO2 98%   Subjective:    Patient ID: Roger Byrd, male    DOB: 1941/02/20, 75 y.o.   MRN: [id number]  HPI: Roger Byrd is a 75 y.o. male  Chief Complaint  Patient presents with  . Hyperlipidemia  . Hypertension   Hypertension Using medications without difficulty Average home BPs   No problems or lightheadedness No chest pain with exertion or shortness of breath No Edema   Hyperlipidemia Using medications without problems: No Muscle aches  Diet compliance: good diet Exercise: "working"  Relevant past medical, surgical, family and social history reviewed and updated as indicated. Interim medical history since our last visit reviewed. Allergies and medications reviewed and updated.  Review of Systems  Per HPI unless specifically indicated above     Objective:    BP 136/69 mmHg  Pulse 73  Temp(Src) 97.6 F (36.4 C)  Ht 5' 6.5" (1.689 m)  Wt 157 lb 12.8 oz (71.578 kg)  BMI 25.09 kg/m2  SpO2 98%  Wt Readings from Last 3 Encounters:  11/21/15 157 lb 12.8 oz (71.578 kg)  09/05/15 155 lb 9.6 oz (70.58 kg)  07/19/15 160 lb (72.576 kg)    Physical Exam  Constitutional: He is oriented to person, place, and time. He appears well-developed and well-nourished. No distress.  HENT:  Head: Normocephalic and atraumatic.  Eyes: Conjunctivae and lids are normal. Right eye exhibits no discharge. Left eye exhibits no discharge. No scleral icterus.  Neck: Normal range of motion. Neck supple. No JVD present. Carotid bruit is not present.  Cardiovascular: Normal rate, regular rhythm and normal heart sounds.   Pulmonary/Chest: Effort normal and breath sounds normal. No respiratory distress.  Abdominal: Normal appearance. There is no splenomegaly or hepatomegaly.  Musculoskeletal: Normal range of motion.  Neurological: He is alert and oriented  to person, place, and time.  Skin: Skin is warm, dry and intact. No rash noted. No pallor.  Psychiatric: He has a normal mood and affect. His behavior is normal. Judgment and thought content normal.   Diabetic Foot Exam - Simple   Simple Foot Form  Diabetic Foot exam was performed with the following findings:  Yes 11/21/2015  3:46 PM  Visual Inspection  No deformities, no ulcerations, no other skin breakdown bilaterally:  Yes  Sensation Testing  Intact to touch and monofilament testing bilaterally:  Yes  Pulse Check  Posterior Tibialis and Dorsalis pulse intact bilaterally:  Yes  Comments         Assessment & Plan:   Problem List Items Addressed This Visit      Unprioritized   Hyperlipidemia   Hypertension - Primary   Relevant Orders   Comprehensive metabolic panel    Other Visit Diagnoses    IFG (impaired fasting glucose)        Relevant Orders    Bayer DCA Hb A1c Waived        Follow up plan: Return in about 6 months (around 05/23/2016).

## 2015-11-22 LAB — COMPREHENSIVE METABOLIC PANEL
A/G RATIO: 1.9 (ref 1.2–2.2)
ALBUMIN: 4.3 g/dL (ref 3.5–4.8)
ALK PHOS: 60 IU/L (ref 39–117)
ALT: 15 IU/L (ref 0–44)
AST: 15 IU/L (ref 0–40)
BUN / CREAT RATIO: 23 (ref 10–24)
BUN: 31 mg/dL — ABNORMAL HIGH (ref 8–27)
Bilirubin Total: 0.5 mg/dL (ref 0.0–1.2)
CALCIUM: 9 mg/dL (ref 8.6–10.2)
CO2: 21 mmol/L (ref 18–29)
CREATININE: 1.36 mg/dL — AB (ref 0.76–1.27)
Chloride: 96 mmol/L (ref 96–106)
GFR calc Af Amer: 59 mL/min/{1.73_m2} — ABNORMAL LOW (ref >59)
GFR, EST NON AFRICAN AMERICAN: 51 mL/min/{1.73_m2} — AB (ref >59)
GLOBULIN, TOTAL: 2.3 g/dL (ref 1.5–4.5)
Glucose: 147 mg/dL — ABNORMAL HIGH (ref 65–99)
POTASSIUM: 4.3 mmol/L (ref 3.5–5.2)
SODIUM: 136 mmol/L (ref 134–144)
Total Protein: 6.6 g/dL (ref 6.0–8.5)

## 2015-11-24 LAB — HEMOGLOBIN A1C: Hemoglobin A1C: 6.2

## 2016-05-06 ENCOUNTER — Other Ambulatory Visit: Payer: Self-pay | Admitting: Unknown Physician Specialty

## 2016-05-08 DIAGNOSIS — H11003 Unspecified pterygium of eye, bilateral: Secondary | ICD-10-CM | POA: Diagnosis not present

## 2016-05-13 ENCOUNTER — Encounter (INDEPENDENT_AMBULATORY_CARE_PROVIDER_SITE_OTHER): Payer: Self-pay

## 2016-05-27 ENCOUNTER — Ambulatory Visit (INDEPENDENT_AMBULATORY_CARE_PROVIDER_SITE_OTHER): Payer: Medicare Other | Admitting: Unknown Physician Specialty

## 2016-05-27 ENCOUNTER — Encounter: Payer: Self-pay | Admitting: Unknown Physician Specialty

## 2016-05-27 VITALS — BP 130/69 | HR 84 | Temp 97.9°F | Ht 66.5 in | Wt 158.0 lb

## 2016-05-27 DIAGNOSIS — E1122 Type 2 diabetes mellitus with diabetic chronic kidney disease: Secondary | ICD-10-CM | POA: Diagnosis not present

## 2016-05-27 DIAGNOSIS — Z0000 Encounter for general adult medical examination without abnormal findings: Secondary | ICD-10-CM

## 2016-05-27 DIAGNOSIS — N182 Chronic kidney disease, stage 2 (mild): Secondary | ICD-10-CM | POA: Diagnosis not present

## 2016-05-27 DIAGNOSIS — E782 Mixed hyperlipidemia: Secondary | ICD-10-CM

## 2016-05-27 DIAGNOSIS — Z7189 Other specified counseling: Secondary | ICD-10-CM | POA: Diagnosis not present

## 2016-05-27 DIAGNOSIS — I129 Hypertensive chronic kidney disease with stage 1 through stage 4 chronic kidney disease, or unspecified chronic kidney disease: Secondary | ICD-10-CM | POA: Diagnosis not present

## 2016-05-27 DIAGNOSIS — I10 Essential (primary) hypertension: Secondary | ICD-10-CM | POA: Diagnosis not present

## 2016-05-27 LAB — BAYER DCA HB A1C WAIVED: HB A1C: 6.1 % (ref <7.0)

## 2016-05-27 MED ORDER — LISINOPRIL 40 MG PO TABS: 40.0000 mg | ORAL_TABLET | Freq: Every day | ORAL | 3 refills | Status: DC

## 2016-05-27 NOTE — Assessment & Plan Note (Signed)
Information given including living will, MPOA forms and decision booklet.  Planning on discussing with daughter.

## 2016-05-27 NOTE — Assessment & Plan Note (Signed)
Diet controlled.  Check Hgb A1C

## 2016-05-27 NOTE — Assessment & Plan Note (Signed)
Stable, continue present medications.   

## 2016-05-27 NOTE — Progress Notes (Signed)
BP 130/69 (BP Location: Left Arm, Cuff Size: Normal)   Pulse 84   Temp 97.9 F (36.6 C)   Ht 5' 6.5" (1.689 m)   Wt 158 lb (71.7 kg)   SpO2 98%   BMI 25.12 kg/m    Subjective:    Patient ID: Roger Byrd, male    DOB: Mar 27, 1941, 75 y.o.   MRN: [id number]  HPI: Roger Byrd is a 75 y.o. male  Chief Complaint  Patient presents with  . Medicare Wellness   Hypertension Using medications without difficulty Average home BPs Not checking   No problems or lightheadedness No chest pain with exertion or shortness of breath No Edema  Hyperlipidemia Using medications without problems: No Muscle aches  Diet compliance/Exercise: Still working and playing golf  Functional Status Survey: Is the patient deaf or have difficulty hearing?: No Does the patient have difficulty seeing, even when wearing glasses/contacts?: No Does the patient have difficulty concentrating, remembering, or making decisions?: No Does the patient have difficulty walking or climbing stairs?: No Does the patient have difficulty dressing or bathing?: No Does the patient have difficulty doing errands alone such as visiting a doctor's office or shopping?: No  Fall Risk  05/27/2016 05/01/2015  Falls in the past year? No No   minicog is negative  Social History   Social History  . Marital status: Widowed    Spouse name: N/A  . Number of children: N/A  . Years of education: N/A   Occupational History  . Not on file.   Social History Main Topics  . Smoking status: Former Smoker    Quit date: 08/08/1970  . Smokeless tobacco: Never Used  . Alcohol use 8.4 oz/week    14 Glasses of wine per week  . Drug use: No  . Sexual activity: Not Currently   Other Topics Concern  . Not on file   Social History Narrative  . No narrative on file   Past Medical History:  Diagnosis Date  . Anxiety   . Chronic kidney disease   . Hyperlipidemia   . Hypertension   . Insomnia   . Thyroid disease    Past  Surgical History:  Procedure Laterality Date  . BACK SURGERY     x2  . COLONOSCOPY WITH PROPOFOL N/A 07/19/2015   Procedure: COLONOSCOPY WITH PROPOFOL;  Surgeon: Manya Silvas, MD;  Location[facility] ENDOSCOPY;  Service: Endoscopy;  Laterality: N/A;   Family History  Problem Relation Age of Onset  . Hypertension Mother   . Heart disease Mother     heart attack  . Hypertension Sister   . Emphysema Father   . Hypertension Daughter   . Hypertension Son   . Hypertension Sister   . Hypertension Sister   . Emphysema Sister      Relevant past medical, surgical, family and social history reviewed and updated as indicated. Interim medical history since our last visit reviewed. Allergies and medications reviewed and updated.  Review of Systems  Constitutional: Negative.   HENT: Negative.   Eyes: Negative.   Respiratory: Negative.   Cardiovascular: Negative.   Gastrointestinal: Negative.   Endocrine: Negative.   Genitourinary: Negative.   Skin: Negative.   Allergic/Immunologic: Negative.   Neurological: Negative.   Hematological: Negative.   Psychiatric/Behavioral: Negative.     Per HPI unless specifically indicated above     Objective:    BP 130/69 (BP Location: Left Arm, Cuff Size: Normal)   Pulse 84   Temp 97.9  F (36.6 C)   Ht 5' 6.5" (1.689 m)   Wt 158 lb (71.7 kg)   SpO2 98%   BMI 25.12 kg/m   Wt Readings from Last 3 Encounters:  05/27/16 158 lb (71.7 kg)  11/21/15 157 lb 12.8 oz (71.6 kg)  09/05/15 155 lb 9.6 oz (70.6 kg)    Physical Exam  Constitutional: He is oriented to person, place, and time. He appears well-developed and well-nourished.  HENT:  Head: Normocephalic.  Right Ear: Tympanic membrane, external ear and ear canal normal.  Left Ear: Tympanic membrane, external ear and ear canal normal.  Mouth/Throat: Uvula is midline, oropharynx is clear and moist and mucous membranes are normal.  Eyes: Pupils are equal, round, and reactive to light.    Cardiovascular: Normal rate, regular rhythm and normal heart sounds.  Exam reveals no gallop and no friction rub.   No murmur heard. Pulmonary/Chest: Effort normal and breath sounds normal. No respiratory distress.  Abdominal: Soft. Bowel sounds are normal. He exhibits no distension. There is no tenderness.  Musculoskeletal: Normal range of motion.  Neurological: He is alert and oriented to person, place, and time. He has normal reflexes.  Skin: Skin is warm and dry.  Psychiatric: He has a normal mood and affect. His behavior is normal. Judgment and thought content normal.    Results for orders placed or performed in visit on 01/18/16  Hemoglobin A1c  Result Value Ref Range   Hemoglobin A1C 6.2       Assessment & Plan:   Problem List Items Addressed This Visit      Unprioritized   Advanced care planning/counseling discussion    Information given including living will, MPOA forms and decision booklet.  Planning on discussing with daughter.        Diabetes mellitus ([place])    Diet controlled.  Check Hgb A1C      Relevant Medications   lisinopril (PRINIVIL,ZESTRIL) 40 MG tablet   Other Relevant Orders   Comprehensive metabolic panel   Bayer DCA Hb A1c Waived   Hyperlipidemia   Relevant Medications   lisinopril (PRINIVIL,ZESTRIL) 40 MG tablet   Other Relevant Orders   Lipid Panel w/o Chol/HDL Ratio   Hypertension    Stable, continue present medications.        Relevant Medications   lisinopril (PRINIVIL,ZESTRIL) 40 MG tablet   Hypertensive CKD (chronic kidney disease)    Stable, continue present medications.         Other Visit Diagnoses    Annual physical exam    -  Primary       Follow up plan: Return in about 6 months (around 11/24/2016).

## 2016-05-28 ENCOUNTER — Encounter: Payer: Self-pay | Admitting: Unknown Physician Specialty

## 2016-05-28 LAB — COMPREHENSIVE METABOLIC PANEL
ALT: 24 IU/L (ref 0–44)
AST: 19 IU/L (ref 0–40)
Albumin/Globulin Ratio: 2.3 — ABNORMAL HIGH (ref 1.2–2.2)
Albumin: 4.5 g/dL (ref 3.5–4.8)
Alkaline Phosphatase: 62 IU/L (ref 39–117)
BUN / CREAT RATIO: 25 — AB (ref 10–24)
BUN: 32 mg/dL — ABNORMAL HIGH (ref 8–27)
Bilirubin Total: 0.5 mg/dL (ref 0.0–1.2)
CALCIUM: 9.1 mg/dL (ref 8.6–10.2)
CO2: 20 mmol/L (ref 18–29)
CREATININE: 1.29 mg/dL — AB (ref 0.76–1.27)
Chloride: 97 mmol/L (ref 96–106)
GFR calc Af Amer: 63 mL/min/{1.73_m2} (ref >59)
GFR, EST NON AFRICAN AMERICAN: 54 mL/min/{1.73_m2} — AB (ref >59)
GLOBULIN, TOTAL: 2 g/dL (ref 1.5–4.5)
Glucose: 107 mg/dL — ABNORMAL HIGH (ref 65–99)
Potassium: 4.2 mmol/L (ref 3.5–5.2)
SODIUM: 139 mmol/L (ref 134–144)
TOTAL PROTEIN: 6.5 g/dL (ref 6.0–8.5)

## 2016-05-28 LAB — CBC WITH DIFFERENTIAL/PLATELET
Basophils Absolute: 0.1 x10E3/uL (ref 0.0–0.2)
Basos: 2 %
EOS (ABSOLUTE): 0.3 x10E3/uL (ref 0.0–0.4)
Eos: 4 %
Hematocrit: 37.4 % — ABNORMAL LOW (ref 37.5–51.0)
Hemoglobin: 12.8 g/dL (ref 12.6–17.7)
Immature Grans (Abs): 0 x10E3/uL (ref 0.0–0.1)
Immature Granulocytes: 0 %
Lymphocytes Absolute: 1.8 x10E3/uL (ref 0.7–3.1)
Lymphs: 25 %
MCH: 27.8 pg (ref 26.6–33.0)
MCHC: 34.2 g/dL (ref 31.5–35.7)
MCV: 81 fL (ref 79–97)
Monocytes Absolute: 0.6 x10E3/uL (ref 0.1–0.9)
Monocytes: 8 %
Neutrophils Absolute: 4.3 x10E3/uL (ref 1.4–7.0)
Neutrophils: 61 %
Platelets: 307 x10E3/uL (ref 150–379)
RBC: 4.61 x10E6/uL (ref 4.14–5.80)
RDW: 13.5 % (ref 12.3–15.4)
WBC: 7.1 x10E3/uL (ref 3.4–10.8)

## 2016-05-28 LAB — LIPID PANEL W/O CHOL/HDL RATIO
Cholesterol, Total: 195 mg/dL (ref 100–199)
HDL: 57 mg/dL (ref >39)
LDL CALC: 73 mg/dL (ref 0–99)
TRIGLYCERIDES: 326 mg/dL — AB (ref 0–149)
VLDL Cholesterol Cal: 65 mg/dL — ABNORMAL HIGH (ref 5–40)

## 2016-11-18 ENCOUNTER — Other Ambulatory Visit: Payer: Self-pay | Admitting: Unknown Physician Specialty

## 2016-11-18 NOTE — Telephone Encounter (Signed)
Routing to provider. Appt on 11/26/16.

## 2016-11-26 ENCOUNTER — Ambulatory Visit (INDEPENDENT_AMBULATORY_CARE_PROVIDER_SITE_OTHER): Payer: Medicare Other | Admitting: Unknown Physician Specialty

## 2016-11-26 ENCOUNTER — Encounter: Payer: Self-pay | Admitting: Unknown Physician Specialty

## 2016-11-26 DIAGNOSIS — N183 Chronic kidney disease, stage 3 unspecified: Secondary | ICD-10-CM

## 2016-11-26 DIAGNOSIS — I10 Essential (primary) hypertension: Secondary | ICD-10-CM | POA: Diagnosis not present

## 2016-11-26 MED ORDER — ATORVASTATIN CALCIUM 40 MG PO TABS: 40.0000 mg | ORAL_TABLET | Freq: Every day | ORAL | 1 refills | Status: DC

## 2016-11-26 MED ORDER — AMLODIPINE BESYLATE 5 MG PO TABS: 5.0000 mg | ORAL_TABLET | Freq: Every day | ORAL | 1 refills | Status: DC

## 2016-11-26 NOTE — Progress Notes (Signed)
BP (!) 145/72   Pulse 63   Temp 98.4 F (36.9 C)   Wt 160 lb 12.8 oz (72.9 kg)   SpO2 98%   BMI 25.56 kg/m    Subjective:    Patient ID: Roger Byrd, male    DOB: 12-May-1941, 76 y.o.   MRN: [id number]  HPI: Roger Byrd is a 76 y.o. male  Chief Complaint  Patient presents with  . Hyperlipidemia  . Hypertension   Hypertension Using medications without difficulty Average home BPs States it is typically around 130/70 or lower.     No problems or lightheadedness No chest pain with exertion or shortness of breath No Edema   Hyperlipidemia Using medications without problems: No Muscle aches  Diet compliance/Exercise:Playing golf and working in the yard.     Relevant past medical, surgical, family and social history reviewed and updated as indicated. Interim medical history since our last visit reviewed. Allergies and medications reviewed and updated.  Review of Systems  Per HPI unless specifically indicated above     Objective:    BP (!) 145/72   Pulse 63   Temp 98.4 F (36.9 C)   Wt 160 lb 12.8 oz (72.9 kg)   SpO2 98%   BMI 25.56 kg/m   Wt Readings from Last 3 Encounters:  11/26/16 160 lb 12.8 oz (72.9 kg)  05/27/16 158 lb (71.7 kg)  11/21/15 157 lb 12.8 oz (71.6 kg)    Physical Exam  Constitutional: He is oriented to person, place, and time. He appears well-developed and well-nourished. No distress.  HENT:  Head: Normocephalic and atraumatic.  Eyes: Conjunctivae and lids are normal. Right eye exhibits no discharge. Left eye exhibits no discharge. No scleral icterus.  Neck: Normal range of motion. Neck supple. No JVD present. Carotid bruit is not present.  Cardiovascular: Normal rate, regular rhythm and normal heart sounds.   Pulmonary/Chest: Effort normal and breath sounds normal. No respiratory distress.  Abdominal: Normal appearance. There is no splenomegaly or hepatomegaly.  Musculoskeletal: Normal range of motion.  Neurological: He is  alert and oriented to person, place, and time.  Skin: Skin is warm, dry and intact. No rash noted. No pallor.  Psychiatric: He has a normal mood and affect. His behavior is normal. Judgment and thought content normal.    Results for orders placed or performed in visit on 05/27/16  Comprehensive metabolic panel  Result Value Ref Range   Glucose 107 (H) 65 - 99 mg/dL   BUN 32 (H) 8 - 27 mg/dL   Creatinine, Ser 1.29 (H) 0.76 - 1.27 mg/dL   GFR calc non Af Amer 54 (L) >59 mL/min/1.73   GFR calc Af Amer 63 >59 mL/min/1.73   BUN/Creatinine Ratio 25 (H) 10 - 24   Sodium 139 134 - 144 mmol/L   Potassium 4.2 3.5 - 5.2 mmol/L   Chloride 97 96 - 106 mmol/L   CO2 20 18 - 29 mmol/L   Calcium 9.1 8.6 - 10.2 mg/dL   Total Protein 6.5 6.0 - 8.5 g/dL   Albumin 4.5 3.5 - 4.8 g/dL   Globulin, Total 2.0 1.5 - 4.5 g/dL   Albumin/Globulin Ratio 2.3 (H) 1.2 - 2.2   Bilirubin Total 0.5 0.0 - 1.2 mg/dL   Alkaline Phosphatase 62 39 - 117 IU/L   AST 19 0 - 40 IU/L   ALT 24 0 - 44 IU/L  Lipid Panel w/o Chol/HDL Ratio  Result Value Ref Range   Cholesterol, Total 195 100 -  199 mg/dL   Triglycerides 326 (H) 0 - 149 mg/dL   HDL 57 >39 mg/dL   VLDL Cholesterol Cal 65 (H) 5 - 40 mg/dL   LDL Calculated 73 0 - 99 mg/dL  Bayer DCA Hb A1c Waived  Result Value Ref Range   Bayer DCA Hb A1c Waived 6.1 <7.0 %  CBC with Differential/Platelet  Result Value Ref Range   WBC 7.1 3.4 - 10.8 x10E3/uL   RBC 4.61 4.14 - 5.80 x10E6/uL   Hemoglobin 12.8 12.6 - 17.7 g/dL   Hematocrit 37.4 (L) 37.5 - 51.0 %   MCV 81 79 - 97 fL   MCH 27.8 26.6 - 33.0 pg   MCHC 34.2 31.5 - 35.7 g/dL   RDW 13.5 12.3 - 15.4 %   Platelets 307 150 - 379 x10E3/uL   Neutrophils 61 Not Estab. %   Lymphs 25 Not Estab. %   Monocytes 8 Not Estab. %   Eos 4 Not Estab. %   Basos 2 Not Estab. %   Neutrophils Absolute 4.3 1.4 - 7.0 x10E3/uL   Lymphocytes Absolute 1.8 0.7 - 3.1 x10E3/uL   Monocytes Absolute 0.6 0.1 - 0.9 x10E3/uL   EOS (ABSOLUTE)  0.3 0.0 - 0.4 x10E3/uL   Basophils Absolute 0.1 0.0 - 0.2 x10E3/uL   Immature Granulocytes 0 Not Estab. %   Immature Grans (Abs) 0.0 0.0 - 0.1 x10E3/uL      Assessment & Plan:   Problem List Items Addressed This Visit      Unprioritized   CKD (chronic kidney disease)    Check CMP      Relevant Orders   Comprehensive metabolic panel   Hypertension    Good numbers at home.  BP is slowly decreasing while here.  Check CMP due to CKD      Relevant Medications   amLODipine (NORVASC) 5 MG tablet   atorvastatin (LIPITOR) 40 MG tablet   Other Relevant Orders   Comprehensive metabolic panel       Follow up plan: Return in about 6 months (around 05/29/2017) for physical.

## 2016-11-26 NOTE — Assessment & Plan Note (Signed)
Good numbers at home.  BP is slowly decreasing while here.  Check CMP due to CKD

## 2016-11-26 NOTE — Assessment & Plan Note (Signed)
Check CMP.  ?

## 2016-11-27 LAB — COMPREHENSIVE METABOLIC PANEL
ALBUMIN: 4.5 g/dL (ref 3.5–4.8)
ALT: 17 IU/L (ref 0–44)
AST: 20 IU/L (ref 0–40)
Albumin/Globulin Ratio: 1.9 (ref 1.2–2.2)
Alkaline Phosphatase: 63 IU/L (ref 39–117)
BUN/Creatinine Ratio: 21 (ref 10–24)
BUN: 30 mg/dL — AB (ref 8–27)
Bilirubin Total: 0.3 mg/dL (ref 0.0–1.2)
CO2: 24 mmol/L (ref 18–29)
CREATININE: 1.44 mg/dL — AB (ref 0.76–1.27)
Calcium: 9.9 mg/dL (ref 8.6–10.2)
Chloride: 102 mmol/L (ref 96–106)
GFR calc non Af Amer: 47 mL/min/{1.73_m2} — ABNORMAL LOW (ref >59)
GFR, EST AFRICAN AMERICAN: 55 mL/min/{1.73_m2} — AB (ref >59)
GLOBULIN, TOTAL: 2.4 g/dL (ref 1.5–4.5)
GLUCOSE: 111 mg/dL — AB (ref 65–99)
Potassium: 4.8 mmol/L (ref 3.5–5.2)
SODIUM: 138 mmol/L (ref 134–144)
TOTAL PROTEIN: 6.9 g/dL (ref 6.0–8.5)

## 2016-12-01 ENCOUNTER — Telehealth: Payer: Self-pay | Admitting: Unknown Physician Specialty

## 2016-12-01 ENCOUNTER — Encounter: Payer: Self-pay | Admitting: Unknown Physician Specialty

## 2016-12-01 NOTE — Telephone Encounter (Signed)
Called to discuss creatnine.  Multiple disconnected numbers.  Will send letter

## 2016-12-04 ENCOUNTER — Telehealth: Payer: Self-pay | Admitting: Unknown Physician Specialty

## 2016-12-04 NOTE — Telephone Encounter (Signed)
Routing to [place] as she stated in her letter that she would like to speak to the patient about his lab results.

## 2016-12-04 NOTE — Telephone Encounter (Signed)
Patient received letter from provider regarding blood work results. Patient asks if he could speak with provider regarding blood work.   Please Advise.  Thank you

## 2016-12-05 NOTE — Telephone Encounter (Signed)
Discuss with pt kidney function.  Pt will return in 3 months to recheck

## 2016-12-05 NOTE — Telephone Encounter (Signed)
Called patient and scheduled 3 month follow up

## 2016-12-05 NOTE — Telephone Encounter (Signed)
Thanks, I would, but I need a number in which I can reach him.  Multiple numbers in chart are not working

## 2016-12-05 NOTE — Telephone Encounter (Signed)
Called and spoke with patient. He stated he missed Cheryl's call a couple of days ago but requests to be called back at [phone].

## 2016-12-22 ENCOUNTER — Ambulatory Visit (INDEPENDENT_AMBULATORY_CARE_PROVIDER_SITE_OTHER): Payer: Self-pay | Admitting: Unknown Physician Specialty

## 2016-12-22 ENCOUNTER — Encounter: Payer: Self-pay | Admitting: Unknown Physician Specialty

## 2016-12-22 VITALS — BP 138/68 | HR 69 | Temp 97.6°F | Ht 67.0 in | Wt 156.5 lb

## 2016-12-22 DIAGNOSIS — Z024 Encounter for examination for driving license: Secondary | ICD-10-CM

## 2016-12-22 NOTE — Progress Notes (Signed)
   BP (!) 175/82   Pulse 71   Temp 97.6 F (36.4 C)   Ht 5\' 7"  (1.702 m)   Wt 156 lb 8 oz (71 kg)   SpO2 99%   BMI 24.51 kg/m    Subjective:    Patient ID: Roger Byrd, male    DOB: 1941-01-26, 76 y.o.   MRN: [id number]  HPI: Roger Byrd is a 76 y.o. male  Chief Complaint  Patient presents with  . 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 Interim medical history since our last visit reviewed. Allergies and medications reviewed and updated.  Review of Systems  Per HPI unless specifically indicated above     Objective:    BP (!) 175/82   Pulse 71   Temp 97.6 F (36.4 C)   Ht 5\' 7"  (1.702 m)   Wt 156 lb 8 oz (71 kg)   SpO2 99%   BMI 24.51 kg/m   Wt Readings from Last 3 Encounters:  12/22/16 156 lb 8 oz (71 kg)  11/26/16 160 lb 12.8 oz (72.9 kg)  05/27/16 158 lb (71.7 kg)    Physical Exam  DOT see form Results for orders placed or performed in visit on 11/26/16  Comprehensive metabolic panel  Result Value Ref Range   Glucose 111 (H) 65 - 99 mg/dL   BUN 30 (H) 8 - 27 mg/dL   Creatinine, Ser 1.44 (H) 0.76 - 1.27 mg/dL   GFR calc non Af Amer 47 (L) >59 mL/min/1.73   GFR calc Af Amer 55 (L) >59 mL/min/1.73   BUN/Creatinine Ratio 21 10 - 24   Sodium 138 134 - 144 mmol/L   Potassium 4.8 3.5 - 5.2 mmol/L   Chloride 102 96 - 106 mmol/L   CO2 24 18 - 29 mmol/L   Calcium 9.9 8.6 - 10.2 mg/dL   Total Protein 6.9 6.0 - 8.5 g/dL   Albumin 4.5 3.5 - 4.8 g/dL   Globulin, Total 2.4 1.5 - 4.5 g/dL   Albumin/Globulin Ratio 1.9 1.2 - 2.2   Bilirubin Total 0.3 0.0 - 1.2 mg/dL   Alkaline Phosphatase 63 39 - 117 IU/L   AST 20 0 - 40 IU/L   ALT 17 0 - 44 IU/L      Assessment & Plan:   Problem List Items Addressed This Visit    None      OK for 1 year Follow up plan: No Follow-up on file.

## 2017-03-09 ENCOUNTER — Ambulatory Visit: Payer: Medicare Other | Admitting: Unknown Physician Specialty

## 2017-03-16 ENCOUNTER — Encounter: Payer: Self-pay | Admitting: Unknown Physician Specialty

## 2017-03-16 ENCOUNTER — Ambulatory Visit (INDEPENDENT_AMBULATORY_CARE_PROVIDER_SITE_OTHER): Payer: Medicare Other | Admitting: Unknown Physician Specialty

## 2017-03-16 DIAGNOSIS — I129 Hypertensive chronic kidney disease with stage 1 through stage 4 chronic kidney disease, or unspecified chronic kidney disease: Secondary | ICD-10-CM

## 2017-03-16 DIAGNOSIS — I10 Essential (primary) hypertension: Secondary | ICD-10-CM

## 2017-03-16 DIAGNOSIS — E1122 Type 2 diabetes mellitus with diabetic chronic kidney disease: Secondary | ICD-10-CM

## 2017-03-16 DIAGNOSIS — N183 Chronic kidney disease, stage 3 unspecified: Secondary | ICD-10-CM

## 2017-03-16 DIAGNOSIS — N182 Chronic kidney disease, stage 2 (mild): Secondary | ICD-10-CM | POA: Diagnosis not present

## 2017-03-16 LAB — BAYER DCA HB A1C WAIVED: HB A1C (BAYER DCA - WAIVED): 6 % (ref <7.0)

## 2017-03-16 NOTE — Progress Notes (Signed)
BP 123/72   Pulse 61   Temp 98.2 F (36.8 C)   Wt 157 lb (71.2 kg)   SpO2 99%   BMI 24.59 kg/m    Subjective:    Patient ID: Roger Byrd, male    DOB: Jun 14, 1941, 76 y.o.   MRN: [id number]  HPI: Roger Byrd is a 76 y.o. male  Chief Complaint  Patient presents with  . Hypertension  . Hyperlipidemia   Hypertension Using medications without difficulty Average home BPs not checking   No problems or lightheadedness No chest pain with exertion or shortness of breath No Edema   Hyperlipidemia Using medications without problems No Muscle aches  Diet compliance:Exercise: No changes  IFG History of DM.  Diet controlled  Relevant past medical, surgical, family and social history reviewed and updated as indicated. Interim medical history since our last visit reviewed. Allergies and medications reviewed and updated.  Review of Systems  Per HPI unless specifically indicated above     Objective:    BP 123/72   Pulse 61   Temp 98.2 F (36.8 C)   Wt 157 lb (71.2 kg)   SpO2 99%   BMI 24.59 kg/m   Wt Readings from Last 3 Encounters:  03/16/17 157 lb (71.2 kg)  12/22/16 156 lb 8 oz (71 kg)  11/26/16 160 lb 12.8 oz (72.9 kg)    Physical Exam  Constitutional: He is oriented to person, place, and time. He appears well-developed and well-nourished. No distress.  HENT:  Head: Normocephalic and atraumatic.  Eyes: Conjunctivae and lids are normal. Right eye exhibits no discharge. Left eye exhibits no discharge. No scleral icterus.  Neck: Normal range of motion. Neck supple. No JVD present. Carotid bruit is not present.  Cardiovascular: Normal rate, regular rhythm and normal heart sounds.   Pulmonary/Chest: Effort normal and breath sounds normal. No respiratory distress.  Abdominal: Normal appearance. There is no splenomegaly or hepatomegaly.  Musculoskeletal: Normal range of motion.  Neurological: He is alert and oriented to person, place, and time.  Skin: Skin  is warm, dry and intact. No rash noted. No pallor.  Psychiatric: He has a normal mood and affect. His behavior is normal. Judgment and thought content normal.   Diabetic Foot Exam - Simple   Simple Foot Form Diabetic Foot exam was performed with the following findings:  Yes 03/16/2017  4:09 PM  Visual Inspection No deformities, no ulcerations, no other skin breakdown bilaterally:  Yes Sensation Testing Intact to touch and monofilament testing bilaterally:  Yes Pulse Check Posterior Tibialis and Dorsalis pulse intact bilaterally:  Yes Comments      Results for orders placed or performed in visit on 11/26/16  Comprehensive metabolic panel  Result Value Ref Range   Glucose 111 (H) 65 - 99 mg/dL   BUN 30 (H) 8 - 27 mg/dL   Creatinine, Ser 1.44 (H) 0.76 - 1.27 mg/dL   GFR calc non Af Amer 47 (L) >59 mL/min/1.73   GFR calc Af Amer 55 (L) >59 mL/min/1.73   BUN/Creatinine Ratio 21 10 - 24   Sodium 138 134 - 144 mmol/L   Potassium 4.8 3.5 - 5.2 mmol/L   Chloride 102 96 - 106 mmol/L   CO2 24 18 - 29 mmol/L   Calcium 9.9 8.6 - 10.2 mg/dL   Total Protein 6.9 6.0 - 8.5 g/dL   Albumin 4.5 3.5 - 4.8 g/dL   Globulin, Total 2.4 1.5 - 4.5 g/dL   Albumin/Globulin Ratio 1.9 1.2 -  2.2   Bilirubin Total 0.3 0.0 - 1.2 mg/dL   Alkaline Phosphatase 63 39 - 117 IU/L   AST 20 0 - 40 IU/L   ALT 17 0 - 44 IU/L      Assessment & Plan:   Problem List Items Addressed This Visit      Unprioritized   Diabetes mellitus ([place])    Diet controlled.        Relevant Orders   Bayer DCA Hb A1c Waived   Hypertension    Stable, continue present medications.        Relevant Orders   Lipid Panel w/o Chol/HDL Ratio   Hypertensive CKD (chronic kidney disease)    Stable, continue present medications.  Check GFR      Relevant Orders   CBC with Differential/Platelet   Comprehensive metabolic panel       Follow up plan: Return in about 6 months (around 09/13/2017).

## 2017-03-16 NOTE — Assessment & Plan Note (Signed)
Diet controlled.  

## 2017-03-16 NOTE — Assessment & Plan Note (Signed)
Stable, continue present medications.   

## 2017-03-16 NOTE — Assessment & Plan Note (Signed)
Stable, continue present medications.  Check GFR 

## 2017-03-17 ENCOUNTER — Encounter: Payer: Self-pay | Admitting: Unknown Physician Specialty

## 2017-03-17 LAB — CBC WITH DIFFERENTIAL/PLATELET
BASOS ABS: 0.1 10*3/uL (ref 0.0–0.2)
Basos: 1 %
EOS (ABSOLUTE): 0.4 10*3/uL (ref 0.0–0.4)
Eos: 4 %
Hematocrit: 36.9 % — ABNORMAL LOW (ref 37.5–51.0)
Hemoglobin: 12.5 g/dL — ABNORMAL LOW (ref 13.0–17.7)
Immature Grans (Abs): 0 10*3/uL (ref 0.0–0.1)
Immature Granulocytes: 0 %
LYMPHS ABS: 1.8 10*3/uL (ref 0.7–3.1)
Lymphs: 21 %
MCH: 27.3 pg (ref 26.6–33.0)
MCHC: 33.9 g/dL (ref 31.5–35.7)
MCV: 81 fL (ref 79–97)
MONOCYTES: 7 %
MONOS ABS: 0.6 10*3/uL (ref 0.1–0.9)
Neutrophils Absolute: 5.5 10*3/uL (ref 1.4–7.0)
Neutrophils: 67 %
PLATELETS: 295 10*3/uL (ref 150–379)
RBC: 4.58 x10E6/uL (ref 4.14–5.80)
RDW: 13.9 % (ref 12.3–15.4)
WBC: 8.3 10*3/uL (ref 3.4–10.8)

## 2017-03-17 LAB — COMPREHENSIVE METABOLIC PANEL
ALK PHOS: 60 IU/L (ref 39–117)
ALT: 13 IU/L (ref 0–44)
AST: 20 IU/L (ref 0–40)
Albumin/Globulin Ratio: 2 (ref 1.2–2.2)
Albumin: 4.7 g/dL (ref 3.5–4.8)
BUN/Creatinine Ratio: 20 (ref 10–24)
BUN: 29 mg/dL — AB (ref 8–27)
Bilirubin Total: 0.5 mg/dL (ref 0.0–1.2)
CO2: 22 mmol/L (ref 20–29)
CREATININE: 1.43 mg/dL — AB (ref 0.76–1.27)
Calcium: 9.5 mg/dL (ref 8.6–10.2)
Chloride: 98 mmol/L (ref 96–106)
GFR calc Af Amer: 55 mL/min/{1.73_m2} — ABNORMAL LOW (ref >59)
GFR calc non Af Amer: 48 mL/min/{1.73_m2} — ABNORMAL LOW (ref >59)
GLUCOSE: 99 mg/dL (ref 65–99)
Globulin, Total: 2.4 g/dL (ref 1.5–4.5)
Potassium: 4.3 mmol/L (ref 3.5–5.2)
SODIUM: 134 mmol/L (ref 134–144)
Total Protein: 7.1 g/dL (ref 6.0–8.5)

## 2017-03-17 LAB — LIPID PANEL W/O CHOL/HDL RATIO
CHOLESTEROL TOTAL: 164 mg/dL (ref 100–199)
HDL: 54 mg/dL (ref >39)
LDL CALC: 77 mg/dL (ref 0–99)
TRIGLYCERIDES: 165 mg/dL — AB (ref 0–149)
VLDL CHOLESTEROL CAL: 33 mg/dL (ref 5–40)

## 2017-05-26 ENCOUNTER — Other Ambulatory Visit: Payer: Self-pay | Admitting: Family Medicine

## 2017-05-26 NOTE — Telephone Encounter (Signed)
Your patient 

## 2017-05-28 ENCOUNTER — Ambulatory Visit (INDEPENDENT_AMBULATORY_CARE_PROVIDER_SITE_OTHER): Payer: Medicare Other

## 2017-05-28 VITALS — BP 152/72 | HR 85 | Temp 98.1°F | Resp 16 | Ht 67.0 in | Wt 159.1 lb

## 2017-05-28 DIAGNOSIS — Z0000 Encounter for general adult medical examination without abnormal findings: Secondary | ICD-10-CM | POA: Diagnosis not present

## 2017-05-28 NOTE — Patient Instructions (Addendum)
Roger Byrd , Thank you for taking time to come for your Medicare Wellness Visit. I appreciate your ongoing commitment to your health goals. Please review the following plan we discussed and let me know if I can assist you in the future.   Screening recommendations/referrals: Colonoscopy: completed 07/09/2015 Recommended yearly ophthalmology/optometry visit for glaucoma screening and checkup Recommended yearly dental visit for hygiene and checkup  Vaccinations: Influenza vaccine: up to date  Pneumococcal vaccine: up to date Tdap vaccine: up to date  Shingles vaccine:  Up to date  Advanced directives: Advance directive discussed with you today. I have provided a copy for you to complete at home and have notarized. Once this is complete please bring a copy in to our office so we can scan it into your chart.  Conditions/risks identified: Recommend drinking at least 6-8 glasses of water a day   Next appointment: Follow up on 09/14/2017 at 3:30pm with Roger Byrd. Follow up in one year for your annual wellness exam.   Preventive Care 76 Years and Older, Male Preventive care refers to lifestyle choices and visits with your health care provider that can promote health and wellness. What does preventive care include?  A yearly physical exam. This is also called an annual well check.  Dental exams once or twice a year.  Routine eye exams. Ask your health care provider how often you should have your eyes checked.  Personal lifestyle choices, including:  Daily care of your teeth and gums.  Regular physical activity.  Eating a healthy diet.  Avoiding tobacco and drug use.  Limiting alcohol use.  Practicing safe sex.  Taking low doses of aspirin every day.  Taking vitamin and mineral supplements as recommended by your health care provider. What happens during an annual well check? The services and screenings done by your health care provider during your annual well check will  depend on your age, overall health, lifestyle risk factors, and family history of disease. Counseling  Your health care provider may ask you questions about your:  Alcohol use.  Tobacco use.  Drug use.  Emotional well-being.  Home and relationship well-being.  Sexual activity.  Eating habits.  History of falls.  Memory and ability to understand (cognition).  Work and work Statistician. Screening  You may have the following tests or measurements:  Height, weight, and BMI.  Blood pressure.  Lipid and cholesterol levels. These may be checked every 5 years, or more frequently if you are over 65 years old.  Skin check.  Lung cancer screening. You may have this screening every year starting at age 60 if you have a 30-pack-year history of smoking and currently smoke or have quit within the past 15 years.  Fecal occult blood test (FOBT) of the stool. You may have this test every year starting at age 13.  Flexible sigmoidoscopy or colonoscopy. You may have a sigmoidoscopy every 5 years or a colonoscopy every 10 years starting at age 50.  Prostate cancer screening. Recommendations will vary depending on your family history and other risks.  Hepatitis C blood test.  Hepatitis B blood test.  Sexually transmitted disease (STD) testing.  Diabetes screening. This is done by checking your blood sugar (glucose) after you have not eaten for a while (fasting). You may have this done every 1-3 years.  Abdominal aortic aneurysm (AAA) screening. You may need this if you are a current or former smoker.  Osteoporosis. You may be screened starting at age 76 if you are at  high risk. Talk with your health care provider about your test results, treatment options, and if necessary, the need for more tests. Vaccines  Your health care provider may recommend certain vaccines, such as:  Influenza vaccine. This is recommended every year.  Tetanus, diphtheria, and acellular pertussis (Tdap,  Td) vaccine. You may need a Td booster every 10 years.  Zoster vaccine. You may need this after age 61.  Pneumococcal 13-valent conjugate (PCV13) vaccine. One dose is recommended after age 10.  Pneumococcal polysaccharide (PPSV23) vaccine. One dose is recommended after age 32. Talk to your health care provider about which screenings and vaccines you need and how often you need them. This information is not intended to replace advice given to you by your health care provider. Make sure you discuss any questions you have with your health care provider. Document Released: 07/13/2015 Document Revised: 03/05/2016 Document Reviewed: 04/17/2015 Elsevier Interactive Patient Education  2017 [place] Prevention in the Home Falls can cause injuries. They can happen to people of all ages. There are many things you can do to make your home safe and to help prevent falls. What can I do on the outside of my home?  Regularly fix the edges of walkways and driveways and fix any cracks.  Remove anything that might make you trip as you walk through a door, such as a raised step or threshold.  Trim any bushes or trees on the path to your home.  Use bright outdoor lighting.  Clear any walking paths of anything that might make someone trip, such as rocks or tools.  Regularly check to see if handrails are loose or broken. Make sure that both sides of any steps have handrails.  Any raised decks and porches should have guardrails on the edges.  Have any leaves, snow, or ice cleared regularly.  Use sand or salt on walking paths during winter.  Clean up any spills in your garage right away. This includes oil or grease spills. What can I do in the bathroom?  Use night lights.  Install grab bars by the toilet and in the tub and shower. Do not use towel bars as grab bars.  Use non-skid mats or decals in the tub or shower.  If you need to sit down in the shower, use a plastic, non-slip  stool.  Keep the floor dry. Clean up any water that spills on the floor as soon as it happens.  Remove soap buildup in the tub or shower regularly.  Attach bath mats securely with double-sided non-slip rug tape.  Do not have throw rugs and other things on the floor that can make you trip. What can I do in the bedroom?  Use night lights.  Make sure that you have a light by your bed that is easy to reach.  Do not use any sheets or blankets that are too big for your bed. They should not hang down onto the floor.  Have a firm chair that has side arms. You can use this for support while you get dressed.  Do not have throw rugs and other things on the floor that can make you trip. What can I do in the kitchen?  Clean up any spills right away.  Avoid walking on wet floors.  Keep items that you use a lot in easy-to-reach places.  If you need to reach something above you, use a strong step stool that has a grab bar.  Keep electrical cords out of the  way.  Do not use floor polish or wax that makes floors slippery. If you must use wax, use non-skid floor wax.  Do not have throw rugs and other things on the floor that can make you trip. What can I do with my stairs?  Do not leave any items on the stairs.  Make sure that there are handrails on both sides of the stairs and use them. Fix handrails that are broken or loose. Make sure that handrails are as long as the stairways.  Check any carpeting to make sure that it is firmly attached to the stairs. Fix any carpet that is loose or worn.  Avoid having throw rugs at the top or bottom of the stairs. If you do have throw rugs, attach them to the floor with carpet tape.  Make sure that you have a light switch at the top of the stairs and the bottom of the stairs. If you do not have them, ask someone to add them for you. What else can I do to help prevent falls?  Wear shoes that:  Do not have high heels.  Have rubber bottoms.  Are  comfortable and fit you well.  Are closed at the toe. Do not wear sandals.  If you use a stepladder:  Make sure that it is fully opened. Do not climb a closed stepladder.  Make sure that both sides of the stepladder are locked into place.  Ask someone to hold it for you, if possible.  Clearly mark and make sure that you can see:  Any grab bars or handrails.  First and last steps.  Where the edge of each step is.  Use tools that help you move around (mobility aids) if they are needed. These include:  Canes.  Walkers.  Scooters.  Crutches.  Turn on the lights when you go into a dark area. Replace any light bulbs as soon as they burn out.  Set up your furniture so you have a clear path. Avoid moving your furniture around.  If any of your floors are uneven, fix them.  If there are any pets around you, be aware of where they are.  Review your medicines with your doctor. Some medicines can make you feel dizzy. This can increase your chance of falling. Ask your doctor what other things that you can do to help prevent falls. This information is not intended to replace advice given to you by your health care provider. Make sure you discuss any questions you have with your health care provider. Document Released: 04/12/2009 Document Revised: 11/22/2015 Document Reviewed: 07/21/2014 Elsevier Interactive Patient Education  2017 [employer].

## 2017-05-28 NOTE — Progress Notes (Addendum)
Subjective:   Roger Byrd is a 76 y.o. male who presents for Medicare Annual/Subsequent preventive examination.  Review of Systems:   Cardiac Risk Factors include: male gender;hypertension;advanced age (>68men, >75 women);diabetes mellitus;dyslipidemia     Objective:    Vitals: BP (!) 152/72 (BP Location: Left Arm, Patient Position: Sitting)   Pulse 85   Temp 98.1 F (36.7 C) (Oral)   Resp 16   Ht 5\' 7"  (1.702 m)   Wt 159 lb 1.6 oz (72.2 kg)   BMI 24.92 kg/m   Body mass index is 24.92 kg/m.  Tobacco Social History   Tobacco Use  Smoking Status Former Smoker  . Last attempt to quit: 08/08/1970  . Years since quitting: 46.8  Smokeless Tobacco Never Used     Counseling given: Not Answered   Past Medical History:  Diagnosis Date  . Anxiety   . Chronic kidney disease   . Hyperlipidemia   . Hypertension   . Insomnia   . Thyroid disease    Past Surgical History:  Procedure Laterality Date  . BACK SURGERY     x2  . COLONOSCOPY WITH PROPOFOL N/A 07/19/2015   Procedure: COLONOSCOPY WITH PROPOFOL;  Surgeon: Manya Silvas, MD;  Location: [facility] ENDOSCOPY;  Service: Endoscopy;  Laterality: N/A;   Family History  Problem Relation Age of Onset  . Hypertension Mother   . Heart disease Mother        heart attack  . Hypertension Sister   . Emphysema Father   . Hypertension Daughter   . Hypertension Son   . Hypertension Sister   . Hypertension Sister   . Emphysema Sister    Social History   Substance and Sexual Activity  Sexual Activity Not Currently    Outpatient Encounter Medications as of 05/28/2017  Medication Sig  . amLODipine (NORVASC) 5 MG tablet Take 1 tablet (5 mg total) by mouth daily.  Marland Kitchen aspirin 81 MG tablet Take 81 mg by mouth daily.  Marland Kitchen atorvastatin (LIPITOR) 40 MG tablet Take 1 tablet (40 mg total) by mouth daily at 6 PM.  . hydrochlorothiazide (HYDRODIURIL) 25 MG tablet TAKE 1 TABLET BY MOUTH EVERY DAY  . lisinopril (PRINIVIL,ZESTRIL) 40 MG  tablet Take 1 tablet (40 mg total) by mouth daily.  . [DISCONTINUED] FLUARIX QUADRIVALENT 0.5 ML injection ADM 0.5ML IM UTD   No facility-administered encounter medications on file as of 05/28/2017.     Activities of Daily Living In your present state of health, do you have any difficulty performing the following activities: 05/28/2017  Hearing? N  Vision? N  Difficulty concentrating or making decisions? N  Walking or climbing stairs? N  Dressing or bathing? N  Doing errands, shopping? N  Preparing Food and eating ? N  Using the Toilet? N  In the past six months, have you accidently leaked urine? N  Do you have problems with loss of bowel control? N  Managing your Medications? N  Managing your Finances? N  Housekeeping or managing your Housekeeping? N  Some recent data might be hidden    Patient Care Team: Kathrine Haddock, NP as PCP - General (Nurse Practitioner)   Assessment:     Exercise Activities and Dietary recommendations Current Exercise Habits: The patient does not participate in regular exercise at present, Exercise limited by: None identified  Goals    . DIET - INCREASE WATER INTAKE     Recommend drinking at least 6-8 glasses of water a day  Fall Risk Fall Risk  05/28/2017 05/27/2016 05/01/2015  Falls in the past year? No No No   Depression Screen PHQ 2/9 Scores 05/28/2017 05/27/2016 05/01/2015  PHQ - 2 Score 0 0 0  PHQ- 9 Score 0 0 -    Cognitive Function     6CIT Screen 05/28/2017  What Year? 0 points  What month? 0 points  What time? 0 points  Count back from 20 0 points  Months in reverse 0 points  Repeat phrase 2 points  Total Score 2    Immunization History  Administered Date(s) Administered  . Influenza-Unspecified 03/18/2016, 04/24/2017  . Pneumococcal Conjugate-13 04/10/2014  . Pneumococcal Polysaccharide-23 01/02/2008  . Td 12/20/2009  . Zoster 01/02/2008   Screening Tests Health Maintenance  Topic Date Due  . OPHTHALMOLOGY  EXAM  05/08/2017  . HEMOGLOBIN A1C  09/13/2017  . FOOT EXAM  03/16/2018  . TETANUS/TDAP  12/21/2019  . COLONOSCOPY  07/18/2020  . INFLUENZA VACCINE  Completed  . PNA vac Low Risk Adult  Completed      Plan:     I have personally reviewed and addressed the Medicare Annual Wellness questionnaire and have noted the following in the patient's chart:  A. Medical and social history B. Use of alcohol, tobacco or illicit drugs  C. Current medications and supplements D. Functional ability and status E.  Nutritional status F.  Physical activity G. Advance directives H. List of other physicians I.  Hospitalizations, surgeries, and ER visits in previous 12 months J.  [place] such as hearing and vision if needed, cognitive and depression L. Referrals and appointments   In addition, I have reviewed and discussed with patient certain preventive protocols, quality metrics, and best practice recommendations. A written personalized care plan for preventive services as well as general preventive health recommendations were provided to patient.   Signed,  Tyler Aas, LPN Nurse Health Advisor   Nurse Notes: due for diabetic eye exam, patient has an appointment scheduled.

## 2017-06-16 ENCOUNTER — Other Ambulatory Visit: Payer: Self-pay

## 2017-06-16 MED ORDER — LISINOPRIL 40 MG PO TABS: 40.0000 mg | ORAL_TABLET | Freq: Every day | ORAL | 3 refills | Status: DC

## 2017-06-16 NOTE — Telephone Encounter (Signed)
Copied from [place]. Topic: General - Other >> Jun 16, 2017 10:15 AM Lolita Rieger, RMA wrote: Reason for CRM: Pt stated that pharmacy contacted office [facility]) concerning refill and they were told that he was not a pt there. He would like a refill on lisinopril 40mg  tab sent [facility]   Routing to provider. Patient last seen 02/2017 and has f/up 08/2017.

## 2017-06-29 ENCOUNTER — Encounter: Payer: Medicare Other | Admitting: Unknown Physician Specialty

## 2017-07-07 ENCOUNTER — Telehealth: Payer: Self-pay | Admitting: Unknown Physician Specialty

## 2017-07-07 MED ORDER — AMLODIPINE BESYLATE 5 MG PO TABS: 5.0000 mg | ORAL_TABLET | Freq: Every day | ORAL | 1 refills | Status: DC

## 2017-07-07 NOTE — Telephone Encounter (Signed)
Routing to provider. Patient last seen 03/16/17 and has appointment 09/14/17.

## 2017-07-07 NOTE — Telephone Encounter (Signed)
Copied from [place]. Topic: Quick Communication - See Telephone Encounter >> Jul 07, 2017 11:09 AM Ether Griffins B wrote: CRM for notification. See Telephone encounter for:  [place], [place]wick RD needing amlodipine sent back over to fill. Mismatch in information  07/07/17.

## 2017-09-14 ENCOUNTER — Encounter: Payer: Self-pay | Admitting: Unknown Physician Specialty

## 2017-09-14 ENCOUNTER — Ambulatory Visit (INDEPENDENT_AMBULATORY_CARE_PROVIDER_SITE_OTHER): Payer: Medicare Other | Admitting: Unknown Physician Specialty

## 2017-09-14 VITALS — BP 123/67 | HR 71 | Temp 97.5°F | Wt 151.2 lb

## 2017-09-14 DIAGNOSIS — R634 Abnormal weight loss: Secondary | ICD-10-CM | POA: Insufficient documentation

## 2017-09-14 DIAGNOSIS — I129 Hypertensive chronic kidney disease with stage 1 through stage 4 chronic kidney disease, or unspecified chronic kidney disease: Secondary | ICD-10-CM | POA: Diagnosis not present

## 2017-09-14 DIAGNOSIS — N183 Chronic kidney disease, stage 3 (moderate): Secondary | ICD-10-CM | POA: Diagnosis not present

## 2017-09-14 DIAGNOSIS — J069 Acute upper respiratory infection, unspecified: Secondary | ICD-10-CM | POA: Diagnosis not present

## 2017-09-14 DIAGNOSIS — R7301 Impaired fasting glucose: Secondary | ICD-10-CM | POA: Insufficient documentation

## 2017-09-14 LAB — BAYER DCA HB A1C WAIVED: HB A1C (BAYER DCA - WAIVED): 5.2 % (ref <7.0)

## 2017-09-14 NOTE — Assessment & Plan Note (Signed)
8 pound weight loss from previous.  Appetite is poor. Colonoscopy 2017.  Denies depression.  Quit smoking 2017

## 2017-09-14 NOTE — Assessment & Plan Note (Deleted)
Stable, continue present medications.   

## 2017-09-14 NOTE — Assessment & Plan Note (Signed)
Stable, continue present medications.   

## 2017-09-14 NOTE — Assessment & Plan Note (Deleted)
Check Hgb A1C 

## 2017-09-14 NOTE — Assessment & Plan Note (Signed)
Check Hgb A1C 

## 2017-09-14 NOTE — Progress Notes (Signed)
BP 123/67   Pulse 71   Temp (!) 97.5 F (36.4 C) (Oral)   Wt 151 lb 3.2 oz (68.6 kg)   SpO2 98%   BMI 23.68 kg/m    Subjective:    Patient ID: Roger Byrd, male    DOB: 06-Nov-1940, 77 y.o.   MRN: [id number]  HPI: Roger Byrd is a 77 y.o. male  Chief Complaint  Patient presents with  . Hyperlipidemia  . Hypertension  . Hand Problem    pt states his right hand has been falling asleep at night and has been waking him up   Hypertension Using medications without difficulty Average home BPs not checking   No problems or lightheadedness No chest pain with exertion or shortness of breath No Edema  Noted CKD and normocytimc anemia last visit  IFG Last hemoglobin A1C was 6.0   Hyperlipidemia Using medications without problems: No Muscle aches  Diet compliance:Exercise:  URI   This is a new problem. Episode onset: 2 days ago. The problem has been unchanged. There has been no fever. Associated symptoms include congestion, coughing, diarrhea, rhinorrhea, sneezing and a sore throat. Pertinent negatives include no abdominal pain, chest pain, dysuria, ear pain, headaches, joint pain, joint swelling, nausea, neck pain, plugged ear sensation, rash, sinus pain, vomiting or wheezing.    Relevant past medical, surgical, family and social history reviewed and updated as indicated. Interim medical history since our last visit reviewed. Allergies and medications reviewed and updated.  Review of Systems  Constitutional: Negative.   HENT: Positive for congestion, rhinorrhea, sneezing and sore throat. Negative for ear pain and sinus pain.   Respiratory: Positive for cough. Negative for wheezing.   Cardiovascular: Negative.  Negative for chest pain.  Gastrointestinal: Positive for diarrhea. Negative for abdominal pain, nausea and vomiting.  Genitourinary: Negative for dysuria.  Musculoskeletal: Negative for joint pain and neck pain.       Right hand numbness for carpal tunnel.   Wakes him up at night  Skin: Negative for rash.  Neurological: Negative for headaches.    Per HPI unless specifically indicated above     Objective:    BP 123/67   Pulse 71   Temp (!) 97.5 F (36.4 C) (Oral)   Wt 151 lb 3.2 oz (68.6 kg)   SpO2 98%   BMI 23.68 kg/m   Wt Readings from Last 3 Encounters:  09/14/17 151 lb 3.2 oz (68.6 kg)  05/28/17 159 lb 1.6 oz (72.2 kg)  03/16/17 157 lb (71.2 kg)    Physical Exam  Constitutional: He is oriented to person, place, and time. He appears well-developed and well-nourished. No distress.  HENT:  Head: Normocephalic and atraumatic.  Right Ear: Tympanic membrane and ear canal normal.  Left Ear: Tympanic membrane and ear canal normal.  Nose: Rhinorrhea present. Right sinus exhibits no maxillary sinus tenderness and no frontal sinus tenderness. Left sinus exhibits no maxillary sinus tenderness and no frontal sinus tenderness.  Mouth/Throat: Uvula is midline. Posterior oropharyngeal edema present.  Eyes: Conjunctivae and lids are normal. Right eye exhibits no discharge. Left eye exhibits no discharge. No scleral icterus.  Neck: Neck supple.  Cardiovascular: Normal rate, regular rhythm and normal heart sounds.  Pulmonary/Chest: Effort normal and breath sounds normal. No respiratory distress.  Abdominal: Normal appearance. There is no splenomegaly or hepatomegaly.  Musculoskeletal: Normal range of motion.  Neurological: He is alert and oriented to person, place, and time.  Skin: Skin is warm, dry and intact. No  rash noted. No pallor.  Psychiatric: He has a normal mood and affect. His behavior is normal. Judgment and thought content normal.  Nursing note and vitals reviewed.   Results for orders placed or performed in visit on 03/16/17  CBC with Differential/Platelet  Result Value Ref Range   WBC 8.3 3.4 - 10.8 x10E3/uL   RBC 4.58 4.14 - 5.80 x10E6/uL   Hemoglobin 12.5 (L) 13.0 - 17.7 g/dL   Hematocrit 36.9 (L) 37.5 - 51.0 %   MCV 81  79 - 97 fL   MCH 27.3 26.6 - 33.0 pg   MCHC 33.9 31.5 - 35.7 g/dL   RDW 13.9 12.3 - 15.4 %   Platelets 295 150 - 379 x10E3/uL   Neutrophils 67 Not Estab. %   Lymphs 21 Not Estab. %   Monocytes 7 Not Estab. %   Eos 4 Not Estab. %   Basos 1 Not Estab. %   Neutrophils Absolute 5.5 1.4 - 7.0 x10E3/uL   Lymphocytes Absolute 1.8 0.7 - 3.1 x10E3/uL   Monocytes Absolute 0.6 0.1 - 0.9 x10E3/uL   EOS (ABSOLUTE) 0.4 0.0 - 0.4 x10E3/uL   Basophils Absolute 0.1 0.0 - 0.2 x10E3/uL   Immature Granulocytes 0 Not Estab. %   Immature Grans (Abs) 0.0 0.0 - 0.1 x10E3/uL  Comprehensive metabolic panel  Result Value Ref Range   Glucose 99 65 - 99 mg/dL   BUN 29 (H) 8 - 27 mg/dL   Creatinine, Ser 1.43 (H) 0.76 - 1.27 mg/dL   GFR calc non Af Amer 48 (L) >59 mL/min/1.73   GFR calc Af Amer 55 (L) >59 mL/min/1.73   BUN/Creatinine Ratio 20 10 - 24   Sodium 134 134 - 144 mmol/L   Potassium 4.3 3.5 - 5.2 mmol/L   Chloride 98 96 - 106 mmol/L   CO2 22 20 - 29 mmol/L   Calcium 9.5 8.6 - 10.2 mg/dL   Total Protein 7.1 6.0 - 8.5 g/dL   Albumin 4.7 3.5 - 4.8 g/dL   Globulin, Total 2.4 1.5 - 4.5 g/dL   Albumin/Globulin Ratio 2.0 1.2 - 2.2   Bilirubin Total 0.5 0.0 - 1.2 mg/dL   Alkaline Phosphatase 60 39 - 117 IU/L   AST 20 0 - 40 IU/L   ALT 13 0 - 44 IU/L  Bayer DCA Hb A1c Waived  Result Value Ref Range   Bayer DCA Hb A1c Waived 6.0 <7.0 %  Lipid Panel w/o Chol/HDL Ratio  Result Value Ref Range   Cholesterol, Total 164 100 - 199 mg/dL   Triglycerides 165 (H) 0 - 149 mg/dL   HDL 54 >39 mg/dL   VLDL Cholesterol Cal 33 5 - 40 mg/dL   LDL Calculated 77 0 - 99 mg/dL      Assessment & Plan:   Problem List Items Addressed This Visit      Unprioritized   Hypertensive CKD (chronic kidney disease)    Stable, continue present medications.      IFG (impaired fasting glucose)    Check Hgb A1C      Relevant Orders   Bayer DCA Hb A1c Waived   Weight loss    8 pound weight loss from previous.  Appetite  is poor. Colonoscopy 2017.  Denies depression.  Quit smoking 2017      Relevant Orders   Comprehensive metabolic panel   CBC with Differential/Platelet   TSH   DG Chest 2 View    Other Visit Diagnoses    Viral upper respiratory  tract infection    -  Primary   Supportive care.  Discussed salt water gargles.         Follow up plan: Return in about 6 weeks (around 10/26/2017).

## 2017-09-15 ENCOUNTER — Ambulatory Visit
Admission: RE | Admit: 2017-09-15 | Discharge: 2017-09-15 | Disposition: A | Discharge status: Home / Self Care | Payer: Medicare Other | Source: Ambulatory Visit | Attending: Unknown Physician Specialty | Admitting: Unknown Physician Specialty

## 2017-09-15 ENCOUNTER — Telehealth: Payer: Self-pay | Admitting: Unknown Physician Specialty

## 2017-09-15 ENCOUNTER — Other Ambulatory Visit: Payer: Self-pay

## 2017-09-15 ENCOUNTER — Encounter: Payer: Self-pay | Admitting: Unknown Physician Specialty

## 2017-09-15 DIAGNOSIS — R634 Abnormal weight loss: Secondary | ICD-10-CM | POA: Diagnosis present

## 2017-09-15 DIAGNOSIS — R05 Cough: Secondary | ICD-10-CM | POA: Diagnosis not present

## 2017-09-15 LAB — CBC WITH DIFFERENTIAL/PLATELET
BASOS: 1 %
Basophils Absolute: 0.1 10*3/uL (ref 0.0–0.2)
EOS (ABSOLUTE): 0.5 10*3/uL — ABNORMAL HIGH (ref 0.0–0.4)
EOS: 8 %
HEMATOCRIT: 37.2 % — AB (ref 37.5–51.0)
Hemoglobin: 12.3 g/dL — ABNORMAL LOW (ref 13.0–17.7)
IMMATURE GRANS (ABS): 0 10*3/uL (ref 0.0–0.1)
IMMATURE GRANULOCYTES: 0 %
LYMPHS: 27 %
Lymphocytes Absolute: 1.7 10*3/uL (ref 0.7–3.1)
MCH: 26.7 pg (ref 26.6–33.0)
MCHC: 33.1 g/dL (ref 31.5–35.7)
MCV: 81 fL (ref 79–97)
MONOCYTES: 9 %
MONOS ABS: 0.6 10*3/uL (ref 0.1–0.9)
NEUTROS PCT: 55 %
Neutrophils Absolute: 3.4 10*3/uL (ref 1.4–7.0)
Platelets: 314 10*3/uL (ref 150–379)
RBC: 4.6 x10E6/uL (ref 4.14–5.80)
RDW: 13.9 % (ref 12.3–15.4)
WBC: 6.1 10*3/uL (ref 3.4–10.8)

## 2017-09-15 LAB — COMPREHENSIVE METABOLIC PANEL
ALBUMIN: 4.4 g/dL (ref 3.5–4.8)
ALT: 15 IU/L (ref 0–44)
AST: 17 IU/L (ref 0–40)
Albumin/Globulin Ratio: 1.6 (ref 1.2–2.2)
Alkaline Phosphatase: 78 IU/L (ref 39–117)
BUN/Creatinine Ratio: 18 (ref 10–24)
BUN: 26 mg/dL (ref 8–27)
Bilirubin Total: 0.4 mg/dL (ref 0.0–1.2)
CO2: 23 mmol/L (ref 20–29)
CREATININE: 1.43 mg/dL — AB (ref 0.76–1.27)
Calcium: 9.1 mg/dL (ref 8.6–10.2)
Chloride: 98 mmol/L (ref 96–106)
GFR calc Af Amer: 55 mL/min/{1.73_m2} — ABNORMAL LOW (ref >59)
GFR, EST NON AFRICAN AMERICAN: 47 mL/min/{1.73_m2} — AB (ref >59)
GLOBULIN, TOTAL: 2.8 g/dL (ref 1.5–4.5)
Glucose: 102 mg/dL — ABNORMAL HIGH (ref 65–99)
Potassium: 4.4 mmol/L (ref 3.5–5.2)
SODIUM: 134 mmol/L (ref 134–144)
Total Protein: 7.2 g/dL (ref 6.0–8.5)

## 2017-09-15 LAB — TSH: TSH: 5.86 u[IU]/mL — ABNORMAL HIGH (ref 0.450–4.500)

## 2017-09-15 MED ORDER — ATORVASTATIN CALCIUM 40 MG PO TABS: 40.0000 mg | ORAL_TABLET | Freq: Every day | ORAL | 1 refills | Status: DC

## 2017-09-15 NOTE — Telephone Encounter (Signed)
Copied from [place]. Topic: Quick Communication - See Telephone Encounter >> Sep 15, 2017 10:20 AM Ahmed Prima L wrote: CRM for notification. See Telephone encounter for:   09/15/17.  [facility] called and that they got a reject letter from office saying "NO" to the refill bc he was not a patient of Mal[facility]. Please advise.  They need a refill on atorvastatin (LIPITOR) 40 MG tablet

## 2017-10-26 ENCOUNTER — Encounter: Payer: Self-pay | Admitting: Unknown Physician Specialty

## 2017-10-26 ENCOUNTER — Ambulatory Visit (INDEPENDENT_AMBULATORY_CARE_PROVIDER_SITE_OTHER): Payer: Medicare Other | Admitting: Unknown Physician Specialty

## 2017-10-26 VITALS — BP 123/67 | HR 73 | Temp 98.6°F | Ht 67.0 in | Wt 153.6 lb

## 2017-10-26 DIAGNOSIS — E039 Hypothyroidism, unspecified: Secondary | ICD-10-CM | POA: Diagnosis not present

## 2017-10-26 DIAGNOSIS — G5603 Carpal tunnel syndrome, bilateral upper limbs: Secondary | ICD-10-CM | POA: Diagnosis not present

## 2017-10-26 DIAGNOSIS — R634 Abnormal weight loss: Secondary | ICD-10-CM | POA: Diagnosis not present

## 2017-10-26 DIAGNOSIS — G5600 Carpal tunnel syndrome, unspecified upper limb: Secondary | ICD-10-CM | POA: Insufficient documentation

## 2017-10-26 NOTE — Assessment & Plan Note (Signed)
Elevated TSH at 5.86.  Asymptomatic.  Will recheck in 6 months for the mild elevation.

## 2017-10-26 NOTE — Assessment & Plan Note (Signed)
Noted last visit.  Gained 2 pounds.  Colonoscopy, chest x-ray, and labs non-contributory.  Will recheck in 6 months

## 2017-10-26 NOTE — Assessment & Plan Note (Signed)
Failed conservative care.  Refer to surgery

## 2017-10-26 NOTE — Progress Notes (Signed)
BP 123/67   Pulse 73   Temp 98.6 F (37 C) (Oral)   Ht 5\' 7"  (1.702 m)   Wt 153 lb 9.6 oz (69.7 kg)   SpO2 98%   BMI 24.06 kg/m    Subjective:    Patient ID: Roger Byrd, male    DOB: [birth year], 77 y.o.   MRN: [id number]  HPI: Roger Byrd is a 77 y.o. male  Chief Complaint  Patient presents with  . Weight Loss    6 week f/up   High TSH No trouble of weight gain.  Energy level is good.    Carpal tunnel Wakes him up 4-5 times/night with numbness.  Wants referral for surgery.  Wrist splints at not helping.    Weight loss Gained 2.5 pounds from last visit.  Chest x-ray normal.  Recent colonoscopy.  Labs without change except elevated TSH.    Relevant past medical, surgical, family and social history reviewed and updated as indicated. Interim medical history since our last visit reviewed. Allergies and medications reviewed and updated.  Review of Systems  Constitutional: Negative.   Respiratory: Negative.   Cardiovascular: Negative.   Gastrointestinal: Negative.   Psychiatric/Behavioral: Negative.     Per HPI unless specifically indicated above     Objective:    BP 123/67   Pulse 73   Temp 98.6 F (37 C) (Oral)   Ht 5\' 7"  (1.702 m)   Wt 153 lb 9.6 oz (69.7 kg)   SpO2 98%   BMI 24.06 kg/m   Wt Readings from Last 3 Encounters:  10/26/17 153 lb 9.6 oz (69.7 kg)  09/14/17 151 lb 3.2 oz (68.6 kg)  05/28/17 159 lb 1.6 oz (72.2 kg)    Physical Exam  Constitutional: He is oriented to person, place, and time. He appears well-developed and well-nourished. No distress.  HENT:  Head: Normocephalic and atraumatic.  Eyes: Conjunctivae and lids are normal. Right eye exhibits no discharge. Left eye exhibits no discharge. No scleral icterus.  Neck: Normal range of motion. Neck supple. No JVD present. Carotid bruit is not present.  Cardiovascular: Normal rate, regular rhythm and normal heart sounds.  Pulmonary/Chest: Effort normal and breath sounds normal. No  respiratory distress.  Abdominal: Normal appearance. There is no splenomegaly or hepatomegaly.  Musculoskeletal: Normal range of motion.  Neurological: He is alert and oriented to person, place, and time.  Skin: Skin is warm, dry and intact. No rash noted. No pallor.  Psychiatric: He has a normal mood and affect. His behavior is normal. Judgment and thought content normal.    Results for orders placed or performed in visit on 09/14/17  Comprehensive metabolic panel  Result Value Ref Range   Glucose 102 (H) 65 - 99 mg/dL   BUN 26 8 - 27 mg/dL   Creatinine, Ser 1.43 (H) 0.76 - 1.27 mg/dL   GFR calc non Af Amer 47 (L) >59 mL/min/1.73   GFR calc Af Amer 55 (L) >59 mL/min/1.73   BUN/Creatinine Ratio 18 10 - 24   Sodium 134 134 - 144 mmol/L   Potassium 4.4 3.5 - 5.2 mmol/L   Chloride 98 96 - 106 mmol/L   CO2 23 20 - 29 mmol/L   Calcium 9.1 8.6 - 10.2 mg/dL   Total Protein 7.2 6.0 - 8.5 g/dL   Albumin 4.4 3.5 - 4.8 g/dL   Globulin, Total 2.8 1.5 - 4.5 g/dL   Albumin/Globulin Ratio 1.6 1.2 - 2.2   Bilirubin Total 0.4 0.0 -  1.2 mg/dL   Alkaline Phosphatase 78 39 - 117 IU/L   AST 17 0 - 40 IU/L   ALT 15 0 - 44 IU/L  CBC with Differential/Platelet  Result Value Ref Range   WBC 6.1 3.4 - 10.8 x10E3/uL   RBC 4.60 4.14 - 5.80 x10E6/uL   Hemoglobin 12.3 (L) 13.0 - 17.7 g/dL   Hematocrit 37.2 (L) 37.5 - 51.0 %   MCV 81 79 - 97 fL   MCH 26.7 26.6 - 33.0 pg   MCHC 33.1 31.5 - 35.7 g/dL   RDW 13.9 12.3 - 15.4 %   Platelets 314 150 - 379 x10E3/uL   Neutrophils 55 Not Estab. %   Lymphs 27 Not Estab. %   Monocytes 9 Not Estab. %   Eos 8 Not Estab. %   Basos 1 Not Estab. %   Neutrophils Absolute 3.4 1.4 - 7.0 x10E3/uL   Lymphocytes Absolute 1.7 0.7 - 3.1 x10E3/uL   Monocytes Absolute 0.6 0.1 - 0.9 x10E3/uL   EOS (ABSOLUTE) 0.5 (H) 0.0 - 0.4 x10E3/uL   Basophils Absolute 0.1 0.0 - 0.2 x10E3/uL   Immature Granulocytes 0 Not Estab. %   Immature Grans (Abs) 0.0 0.0 - 0.1 x10E3/uL  Bayer DCA  Hb A1c Waived  Result Value Ref Range   Bayer DCA Hb A1c Waived 5.2 <7.0 %  TSH  Result Value Ref Range   TSH 5.860 (H) 0.450 - 4.500 uIU/mL      Assessment & Plan:   Problem List Items Addressed This Visit      Unprioritized   Bilateral carpal tunnel syndrome - Primary    Failed conservative care.  Refer to surgery      Relevant Orders   Ambulatory referral to Orthopedic Surgery   Hypothyroidism    Elevated TSH at 5.86.  Asymptomatic.  Will recheck in 6 months for the mild elevation.        Weight loss    Noted last visit.  Gained 2 pounds.  Colonoscopy, chest x-ray, and labs non-contributory.  Will recheck in 6 months          Follow up plan: Return in about 6 months (around 04/27/2018).

## 2017-11-18 DIAGNOSIS — G5603 Carpal tunnel syndrome, bilateral upper limbs: Secondary | ICD-10-CM | POA: Diagnosis not present

## 2017-11-19 DIAGNOSIS — G5601 Carpal tunnel syndrome, right upper limb: Secondary | ICD-10-CM | POA: Diagnosis not present

## 2017-11-19 DIAGNOSIS — G5602 Carpal tunnel syndrome, left upper limb: Secondary | ICD-10-CM | POA: Diagnosis not present

## 2017-11-19 DIAGNOSIS — G5621 Lesion of ulnar nerve, right upper limb: Secondary | ICD-10-CM | POA: Diagnosis not present

## 2017-11-24 DIAGNOSIS — G5603 Carpal tunnel syndrome, bilateral upper limbs: Secondary | ICD-10-CM | POA: Diagnosis not present

## 2017-11-24 DIAGNOSIS — G5602 Carpal tunnel syndrome, left upper limb: Secondary | ICD-10-CM | POA: Diagnosis not present

## 2017-11-24 DIAGNOSIS — G5601 Carpal tunnel syndrome, right upper limb: Secondary | ICD-10-CM | POA: Diagnosis not present

## 2017-11-24 DIAGNOSIS — M65342 Trigger finger, left ring finger: Secondary | ICD-10-CM | POA: Diagnosis not present

## 2017-11-27 ENCOUNTER — Telehealth: Payer: Self-pay | Admitting: Unknown Physician Specialty

## 2017-11-27 NOTE — Telephone Encounter (Signed)
Copied from [place] [id number]. Topic: Quick Communication - Rx Refill/Question >> Nov 27, 2017  5:57 PM Waylan Rocher, Lumin L wrote: Medication: hydrochlorothiazide (HYDRODIURIL) 25 MG tablet   Has the patient contacted their pharmacy? Yes.   (Agent: If no, request that the patient contact the pharmacy for the refill.) (Agent: If yes, when and what did the pharmacy advise?)  Preferred Pharmacy (with phone number or street name): [facility] [address] [place], [place] - [place] [address] Phone: [phone] Fax: [phone]  Agent: Please be advised that RX refills may take up to 3 business days. We ask that you follow-up with your pharmacy.

## 2017-11-30 ENCOUNTER — Other Ambulatory Visit: Payer: Self-pay | Admitting: Unknown Physician Specialty

## 2017-11-30 NOTE — Telephone Encounter (Signed)
[place], [place] - [address] [phone] (Phone) [phone] (Fax)    Pharmacy checking on the status of medication refill, informed pharmacy please allow 48 to 72 hour turn around time, pharmacy advised request was faxed in the early part of last week, please advise

## 2017-11-30 NOTE — Telephone Encounter (Signed)
Refilled in another encounter.

## 2018-01-04 ENCOUNTER — Other Ambulatory Visit: Payer: Self-pay | Admitting: Unknown Physician Specialty

## 2018-02-18 ENCOUNTER — Ambulatory Visit (INDEPENDENT_AMBULATORY_CARE_PROVIDER_SITE_OTHER): Payer: Medicare Other | Admitting: Physician Assistant

## 2018-02-18 ENCOUNTER — Encounter: Payer: Self-pay | Admitting: Physician Assistant

## 2018-02-18 VITALS — BP 136/65 | HR 69 | Wt 150.0 lb

## 2018-02-18 DIAGNOSIS — M5416 Radiculopathy, lumbar region: Secondary | ICD-10-CM | POA: Diagnosis not present

## 2018-02-18 DIAGNOSIS — M7542 Impingement syndrome of left shoulder: Secondary | ICD-10-CM

## 2018-02-18 MED ORDER — PREDNISONE 10 MG (21) PO TBPK: ORAL_TABLET | ORAL | 0 refills | Status: DC

## 2018-02-18 NOTE — Progress Notes (Signed)
Subjective:    Patient ID: Roger Byrd, male    DOB: 01-Aug-1940, 77 y.o.   MRN: [id number]  Roger Byrd is a 77 y.o. male presenting on 02/18/2018 for Shoulder Pain (Left) and Leg Pain (Right. )   HPI  Patient with history of ruptured lumbar disc that he has previously had injections for. Reports left shoulder pains x two days, difficulty with overhead and back pocket motions. Also reports leg pain in his thighs that feels like his old back injury. Denies weakness, bowel or bladder incontinence.  He does see Dr. Sabra Heck at [facility] for [place].   Social History   Tobacco Use  . Smoking status: Former Smoker    Last attempt to quit: 08/08/1970    Years since quitting: 47.5  . Smokeless tobacco: Never Used  Substance Use Topics  . Alcohol use: Yes    Alcohol/week: 14.0 standard drinks    Types: 14 Glasses of wine per week  . Drug use: No    Review of Systems Per HPI unless specifically indicated above     Objective:    BP 136/65   Pulse 69   Wt 150 lb (68 kg)   SpO2 99%   BMI 23.49 kg/m   Wt Readings from Last 3 Encounters:  02/18/18 150 lb (68 kg)  10/26/17 153 lb 9.6 oz (69.7 kg)  09/14/17 151 lb 3.2 oz (68.6 kg)    Physical Exam  Constitutional: He is oriented to person, place, and time. He appears well-developed and well-nourished.  Cardiovascular: Normal rate and regular rhythm.  Pulmonary/Chest: Effort normal and breath sounds normal.  Musculoskeletal:  Pain in overhead motion of left shoulder. Positive belly press and lift off test for left side. No crepitus. No tenderness to palpation, muscle atrophy. 5/5 strength in BLE.   Neurological: He is alert and oriented to person, place, and time.  Reflex Scores:      Patellar reflexes are 2+ on the right side and 2+ on the left side.      Achilles reflexes are 2+ on the right side and 2+ on the left side. Skin: Skin is warm and dry.  Psychiatric: He has a normal mood and affect. His behavior is  normal.   Results for orders placed or performed in visit on 09/14/17  Comprehensive metabolic panel  Result Value Ref Range   Glucose 102 (H) 65 - 99 mg/dL   BUN 26 8 - 27 mg/dL   Creatinine, Ser 1.43 (H) 0.76 - 1.27 mg/dL   GFR calc non Af Amer 47 (L) >59 mL/min/1.73   GFR calc Af Amer 55 (L) >59 mL/min/1.73   BUN/Creatinine Ratio 18 10 - 24   Sodium 134 134 - 144 mmol/L   Potassium 4.4 3.5 - 5.2 mmol/L   Chloride 98 96 - 106 mmol/L   CO2 23 20 - 29 mmol/L   Calcium 9.1 8.6 - 10.2 mg/dL   Total Protein 7.2 6.0 - 8.5 g/dL   Albumin 4.4 3.5 - 4.8 g/dL   Globulin, Total 2.8 1.5 - 4.5 g/dL   Albumin/Globulin Ratio 1.6 1.2 - 2.2   Bilirubin Total 0.4 0.0 - 1.2 mg/dL   Alkaline Phosphatase 78 39 - 117 IU/L   AST 17 0 - 40 IU/L   ALT 15 0 - 44 IU/L  CBC with Differential/Platelet  Result Value Ref Range   WBC 6.1 3.4 - 10.8 x10E3/uL   RBC 4.60 4.14 - 5.80 x10E6/uL   Hemoglobin 12.3 (  L) 13.0 - 17.7 g/dL   Hematocrit 37.2 (L) 37.5 - 51.0 %   MCV 81 79 - 97 fL   MCH 26.7 26.6 - 33.0 pg   MCHC 33.1 31.5 - 35.7 g/dL   RDW 13.9 12.3 - 15.4 %   Platelets 314 150 - 379 x10E3/uL   Neutrophils 55 Not Estab. %   Lymphs 27 Not Estab. %   Monocytes 9 Not Estab. %   Eos 8 Not Estab. %   Basos 1 Not Estab. %   Neutrophils Absolute 3.4 1.4 - 7.0 x10E3/uL   Lymphocytes Absolute 1.7 0.7 - 3.1 x10E3/uL   Monocytes Absolute 0.6 0.1 - 0.9 x10E3/uL   EOS (ABSOLUTE) 0.5 (H) 0.0 - 0.4 x10E3/uL   Basophils Absolute 0.1 0.0 - 0.2 x10E3/uL   Immature Granulocytes 0 Not Estab. %   Immature Grans (Abs) 0.0 0.0 - 0.1 x10E3/uL  Bayer DCA Hb A1c Waived  Result Value Ref Range   HB A1C (BAYER DCA - WAIVED) 5.2 <7.0 %  TSH  Result Value Ref Range   TSH 5.860 (H) 0.450 - 4.500 uIU/mL      Assessment & Plan:  1. Impingement syndrome of left shoulder  Oral course of prednisone, see Dr. Sabra Heck for injection.   - predniSONE (STERAPRED UNI-PAK 21 TAB) 10 MG (21) TBPK tablet; Take 6 pills on day 1,  take 5 pills on day 2 and so on until complete.  Dispense: 21 tablet; Refill: 0  2. Lumbar radiculopathy  May need different orthopedist, typically refer to Dr. Sharlet Salina.     Follow up plan: Return if symptoms worsen or fail to improve.  Carles Collet, PA-C [facility] 02/18/2018, 2:18 PM

## 2018-02-18 NOTE — Patient Instructions (Signed)
Shoulder Impingement Syndrome Shoulder impingement syndrome is a condition that causes pain when connective tissues (tendons) surrounding the shoulder joint become pinched. These tendons are part of the group of muscles and tissues that help to stabilize the shoulder (rotator cuff). Beneath the rotator cuff is a fluid-filled sac (bursa) that allows the muscles and tendons to glide smoothly. The bursa may become swollen or irritated (bursitis). Bursitis, swelling in the rotator cuff tendons, or both conditions can decrease how much space is under a bone in the shoulder joint (acromion), resulting in impingement. What are the causes? Shoulder impingement syndrome can be caused by bursitis or swelling of the rotator cuff tendons, which may result from:  Repetitive overhead arm movements.  Falling onto the shoulder.  Weakness in the shoulder muscles.  What increases the risk? You may be more likely to develop this condition if you are an athlete who participates in:  Sports that involve throwing, such as baseball.  Tennis.  Swimming.  Volleyball.  Some people are also more likely to develop impingement syndrome because of the shape of their acromion bone. What are the signs or symptoms? The main symptom of this condition is pain on the front or side of the shoulder. Pain may:  Get worse when lifting or raising the arm.  Get worse at night.  Wake you up from sleeping.  Feel sharp when the shoulder is moved, and then fade to an ache.  Other signs and symptoms may include:  Tenderness.  Stiffness.  Inability to raise the arm above shoulder level or behind the body.  Weakness.  How is this diagnosed? This condition may be diagnosed based on:  Your symptoms.  Your medical history.  A physical exam.  Imaging tests, such as: ? X-rays. ? MRI. ? Ultrasound.  How is this treated? Treatment for this condition may include:  Resting your shoulder and avoiding all  activities that cause pain or put stress on the shoulder.  Icing your shoulder.  NSAIDs to help reduce pain and swelling.  One or more injections of medicines to numb the area and reduce inflammation.  Physical therapy.  Surgery. This may be needed if nonsurgical treatments have not helped. Surgery may involve repairing the rotator cuff, reshaping the acromion, or removing the bursa.  Follow these instructions at home: Managing pain, stiffness, and swelling  If directed, apply ice to the injured area. ? Put ice in a plastic bag. ? Place a towel between your skin and the bag. ? Leave the ice on for 20 minutes, 2-3 times a day. Activity  Rest and return to your normal activities as told by your health care provider. Ask your health care provider what activities are safe for you.  Do exercises as told by your health care provider. General instructions  Do not use any tobacco products, including cigarettes, chewing tobacco, or e-cigarettes. Tobacco can delay healing. If you need help quitting, ask your health care provider.  Ask your health care provider when it is safe for you to drive.  Take over-the-counter and prescription medicines only as told by your health care provider.  Keep all follow-up visits as told by your health care provider. This is important. How is this prevented?  Give your body time to rest between periods of activity.  Be safe and responsible while being active to avoid falls.  Maintain physical fitness, including strength and flexibility. Contact a health care provider if:  Your symptoms have not improved after 1-2 months of treatment and   rest.  You cannot lift your arm away from your body. This information is not intended to replace advice given to you by your health care provider. Make sure you discuss any questions you have with your health care provider. Document Released: 06/16/2005 Document Revised: 02/21/2016 Document Reviewed:  05/19/2015 Elsevier Interactive Patient Education  2018 Elsevier Inc.  

## 2018-02-23 ENCOUNTER — Ambulatory Visit (INDEPENDENT_AMBULATORY_CARE_PROVIDER_SITE_OTHER): Payer: Medicare Other | Admitting: Physician Assistant

## 2018-02-23 ENCOUNTER — Encounter: Payer: Self-pay | Admitting: Physician Assistant

## 2018-02-23 VITALS — BP 132/67 | HR 67 | Temp 98.7°F | Ht 67.0 in | Wt 152.0 lb

## 2018-02-23 DIAGNOSIS — Z01818 Encounter for other preprocedural examination: Secondary | ICD-10-CM

## 2018-02-23 DIAGNOSIS — G5601 Carpal tunnel syndrome, right upper limb: Secondary | ICD-10-CM | POA: Diagnosis not present

## 2018-02-23 LAB — URINALYSIS
Bilirubin, UA: NEGATIVE
Ketones, UA: NEGATIVE
Leukocytes, UA: NEGATIVE
Nitrite, UA: NEGATIVE
Protein, UA: NEGATIVE
RBC, UA: NEGATIVE
Specific Gravity, UA: 1.02 (ref 1.005–1.030)
Urobilinogen, Ur: 0.2 mg/dL (ref 0.2–1.0)
pH, UA: 5.5 (ref 5.0–7.5)

## 2018-02-23 NOTE — Patient Instructions (Signed)
Carpal Tunnel Release, Care After Refer to this sheet in the next few weeks. These instructions provide you with information about caring for yourself after your procedure. Your health care provider may also give you more specific instructions. Your treatment has been planned according to current medical practices, but problems sometimes occur. Call your health care provider if you have any problems or questions after your procedure. What can I expect after the procedure? After your procedure, it is typical to have the following:  Pain.  Numbness.  Tingling.  Swelling.  Stiffness.  Bruising.  Follow these instructions at home:  Take medicines only as directed by your health care provider.  There are many different ways to close and cover an incision, including stitches (sutures), skin glue, and adhesive strips. Follow your health care provider's instructions about: ? Incision care. ? Bandage (dressing) changes and removal. ? Incision closure removal.  Wear a splint or a brace as directed by your surgeon. You may need to do this for 2-3 weeks.  Keep your hand raised (elevated) above the level of your heart while you are resting. Move your fingers often.  Avoid activities that cause hand pain.  Ask your surgeon when you can start to do all of your usual activities again, such as: ? Driving. ? Returning to work. ? Bathing and swimming.  Keep all follow-up visits as directed by your health care provider. This is important. You may need physical therapy for several months to speed healing and regain movement. Contact a health care provider if:  You have drainage, redness, swelling, or pain at your incision site.  You have a fever.  You have chills.  Your pain medicine is not working.  Your symptoms do not go away after 2 months.  Your symptoms go away and then return. Get help right away if:  You have pain or numbness that is getting worse.  Your fingers change  color.  You are not able to move your fingers. This information is not intended to replace advice given to you by your health care provider. Make sure you discuss any questions you have with your health care provider. Document Released: 01/03/2005 Document Revised: 11/22/2015 Document Reviewed: 02/01/2014 Elsevier Interactive Patient Education  2018 [employer].

## 2018-02-23 NOTE — Progress Notes (Signed)
Subjective:    Patient ID: Roger Byrd, male    DOB: 09/12/1940, 77 y.o.   MRN: [id number]  Roger Byrd is a 77 y.o. male presenting on 02/23/2018 for Surgery Clearance   HPI   Presents today with request for surgical clearance. He is to have a right carpal tunnel release with Dr. Sabra Heck at [facility]. This has not been scheduled yet, pending clearance.   He has not issues with his heart and his breathing. He denies history of complications with anesthesia. He is currently taking ASA 81 mg daily. He does not have history of diabetes. Denies fevers, respiratory symptoms.   Social History   Tobacco Use  . Smoking status: Former Smoker    Last attempt to quit: 08/08/1970    Years since quitting: 47.5  . Smokeless tobacco: Never Used  Substance Use Topics  . Alcohol use: Yes    Alcohol/week: 14.0 standard drinks    Types: 14 Glasses of wine per week  . Drug use: No    Review of Systems Per HPI unless specifically indicated above     Objective:    BP 132/67   Pulse 67   Temp 98.7 F (37.1 C) (Oral)   Ht 5' 7"  (1.702 m)   Wt 152 lb (68.9 kg)   SpO2 98%   BMI 23.81 kg/m   Wt Readings from Last 3 Encounters:  02/23/18 152 lb (68.9 kg)  02/18/18 150 lb (68 kg)  10/26/17 153 lb 9.6 oz (69.7 kg)    Physical Exam  Constitutional: He is oriented to person, place, and time. He appears well-developed and well-nourished.  Cardiovascular: Normal rate and regular rhythm.  Pulmonary/Chest: Effort normal and breath sounds normal.  Abdominal: Soft. Bowel sounds are normal.  Neurological: He is alert and oriented to person, place, and time.  Skin: Skin is warm and dry.  Psychiatric: He has a normal mood and affect. His behavior is normal.   Results for orders placed or performed in visit on 09/14/17  Comprehensive metabolic panel  Result Value Ref Range   Glucose 102 (H) 65 - 99 mg/dL   BUN 26 8 - 27 mg/dL   Creatinine, Ser 1.43 (H) 0.76 - 1.27 mg/dL   GFR calc non Af  Amer 47 (L) >59 mL/min/1.73   GFR calc Af Amer 55 (L) >59 mL/min/1.73   BUN/Creatinine Ratio 18 10 - 24   Sodium 134 134 - 144 mmol/L   Potassium 4.4 3.5 - 5.2 mmol/L   Chloride 98 96 - 106 mmol/L   CO2 23 20 - 29 mmol/L   Calcium 9.1 8.6 - 10.2 mg/dL   Total Protein 7.2 6.0 - 8.5 g/dL   Albumin 4.4 3.5 - 4.8 g/dL   Globulin, Total 2.8 1.5 - 4.5 g/dL   Albumin/Globulin Ratio 1.6 1.2 - 2.2   Bilirubin Total 0.4 0.0 - 1.2 mg/dL   Alkaline Phosphatase 78 39 - 117 IU/L   AST 17 0 - 40 IU/L   ALT 15 0 - 44 IU/L  CBC with Differential/Platelet  Result Value Ref Range   WBC 6.1 3.4 - 10.8 x10E3/uL   RBC 4.60 4.14 - 5.80 x10E6/uL   Hemoglobin 12.3 (L) 13.0 - 17.7 g/dL   Hematocrit 37.2 (L) 37.5 - 51.0 %   MCV 81 79 - 97 fL   MCH 26.7 26.6 - 33.0 pg   MCHC 33.1 31.5 - 35.7 g/dL   RDW 13.9 12.3 - 15.4 %   Platelets 314  150 - 379 x10E3/uL   Neutrophils 55 Not Estab. %   Lymphs 27 Not Estab. %   Monocytes 9 Not Estab. %   Eos 8 Not Estab. %   Basos 1 Not Estab. %   Neutrophils Absolute 3.4 1.4 - 7.0 x10E3/uL   Lymphocytes Absolute 1.7 0.7 - 3.1 x10E3/uL   Monocytes Absolute 0.6 0.1 - 0.9 x10E3/uL   EOS (ABSOLUTE) 0.5 (H) 0.0 - 0.4 x10E3/uL   Basophils Absolute 0.1 0.0 - 0.2 x10E3/uL   Immature Granulocytes 0 Not Estab. %   Immature Grans (Abs) 0.0 0.0 - 0.1 x10E3/uL  Bayer DCA Hb A1c Waived  Result Value Ref Range   HB A1C (BAYER DCA - WAIVED) 5.2 <7.0 %  TSH  Result Value Ref Range   TSH 5.860 (H) 0.450 - 4.500 uIU/mL      Assessment & Plan:  1. Pre-operative clearance  Labs as below. EKG looks normal today. Will sign clearance form pending labwork.   - EKG 12-Lead - CBC with Differential - HgB A1c - Comp Met (CMET) - Urinalysis - INR/PT  2. Right carpal tunnel syndrome  I have spent 25 minutes with this patient, >50% of which was spent on counseling and coordination of care.    Follow up plan: Return if symptoms worsen or fail to improve.  Carles Collet,  PA-C [name] 02/23/2018, 3:10 PM

## 2018-02-24 LAB — COMPREHENSIVE METABOLIC PANEL
ALT: 24 IU/L (ref 0–44)
AST: 22 IU/L (ref 0–40)
Albumin/Globulin Ratio: 1.7 (ref 1.2–2.2)
Albumin: 4.3 g/dL (ref 3.5–4.8)
Alkaline Phosphatase: 76 IU/L (ref 39–117)
BUN/Creatinine Ratio: 29 — ABNORMAL HIGH (ref 10–24)
BUN: 44 mg/dL — ABNORMAL HIGH (ref 8–27)
Bilirubin Total: 0.3 mg/dL (ref 0.0–1.2)
CO2: 19 mmol/L — ABNORMAL LOW (ref 20–29)
Calcium: 9.7 mg/dL (ref 8.6–10.2)
Chloride: 102 mmol/L (ref 96–106)
Creatinine, Ser: 1.54 mg/dL — ABNORMAL HIGH (ref 0.76–1.27)
GFR calc Af Amer: 50 mL/min/{1.73_m2} — ABNORMAL LOW (ref >59)
GFR calc non Af Amer: 43 mL/min/{1.73_m2} — ABNORMAL LOW (ref >59)
Globulin, Total: 2.6 g/dL (ref 1.5–4.5)
Glucose: 129 mg/dL — ABNORMAL HIGH (ref 65–99)
Potassium: 4.6 mmol/L (ref 3.5–5.2)
Sodium: 141 mmol/L (ref 134–144)
Total Protein: 6.9 g/dL (ref 6.0–8.5)

## 2018-02-24 LAB — PROTIME-INR
INR: 1.1 (ref 0.8–1.2)
Prothrombin Time: 11.1 s (ref 9.1–12.0)

## 2018-02-24 LAB — CBC WITH DIFFERENTIAL/PLATELET
Basophils Absolute: 0 10*3/uL (ref 0.0–0.2)
Basos: 1 %
EOS (ABSOLUTE): 0.1 10*3/uL (ref 0.0–0.4)
Eos: 1 %
Hematocrit: 36.4 % — ABNORMAL LOW (ref 37.5–51.0)
Hemoglobin: 12.2 g/dL — ABNORMAL LOW (ref 13.0–17.7)
Immature Grans (Abs): 0.1 10*3/uL (ref 0.0–0.1)
Immature Granulocytes: 1 %
Lymphocytes Absolute: 2.2 10*3/uL (ref 0.7–3.1)
Lymphs: 28 %
MCH: 27.9 pg (ref 26.6–33.0)
MCHC: 33.5 g/dL (ref 31.5–35.7)
MCV: 83 fL (ref 79–97)
Monocytes Absolute: 0.6 10*3/uL (ref 0.1–0.9)
Monocytes: 8 %
Neutrophils Absolute: 4.9 10*3/uL (ref 1.4–7.0)
Neutrophils: 61 %
Platelets: 419 10*3/uL (ref 150–450)
RBC: 4.38 x10E6/uL (ref 4.14–5.80)
RDW: 14.3 % (ref 12.3–15.4)
WBC: 8 10*3/uL (ref 3.4–10.8)

## 2018-02-24 LAB — HEMOGLOBIN A1C
Est. average glucose Bld gHb Est-mCnc: 126 mg/dL
Hgb A1c MFr Bld: 6 % — ABNORMAL HIGH (ref 4.8–5.6)

## 2018-02-25 ENCOUNTER — Other Ambulatory Visit: Payer: Self-pay | Admitting: Physician Assistant

## 2018-02-25 ENCOUNTER — Telehealth: Payer: Self-pay | Admitting: Physician Assistant

## 2018-02-25 ENCOUNTER — Other Ambulatory Visit: Payer: Self-pay | Admitting: Specialist

## 2018-02-25 DIAGNOSIS — N183 Chronic kidney disease, stage 3 unspecified: Secondary | ICD-10-CM

## 2018-02-25 NOTE — Telephone Encounter (Signed)
Labs reviewed with patient, need repeat labs before clearance.   Just FYI, no action needed.    Copied from [place] [id number]. Topic: General - Other >> Feb 25, 2018  8:52 AM Synthia Innocent wrote: Reason for CRM: [facility] has not received surgical clearance, was seen in office 02/23/18 for clearance. Awaiting surgical clearance form, surgery scheduled for 03/08/18. Fax # [phone]

## 2018-02-26 ENCOUNTER — Other Ambulatory Visit: Payer: Medicare Other

## 2018-02-26 DIAGNOSIS — N183 Chronic kidney disease, stage 3 unspecified: Secondary | ICD-10-CM

## 2018-02-27 LAB — COMPREHENSIVE METABOLIC PANEL
ALT: 18 IU/L (ref 0–44)
AST: 15 IU/L (ref 0–40)
Albumin/Globulin Ratio: 2 (ref 1.2–2.2)
Albumin: 4 g/dL (ref 3.5–4.8)
Alkaline Phosphatase: 70 IU/L (ref 39–117)
BUN/Creatinine Ratio: 24 (ref 10–24)
BUN: 34 mg/dL — ABNORMAL HIGH (ref 8–27)
Bilirubin Total: 0.4 mg/dL (ref 0.0–1.2)
CO2: 22 mmol/L (ref 20–29)
Calcium: 9.2 mg/dL (ref 8.6–10.2)
Chloride: 95 mmol/L — ABNORMAL LOW (ref 96–106)
Creatinine, Ser: 1.44 mg/dL — ABNORMAL HIGH (ref 0.76–1.27)
GFR calc Af Amer: 54 mL/min/{1.73_m2} — ABNORMAL LOW (ref >59)
GFR calc non Af Amer: 47 mL/min/{1.73_m2} — ABNORMAL LOW (ref >59)
Globulin, Total: 2 g/dL (ref 1.5–4.5)
Glucose: 92 mg/dL (ref 65–99)
Potassium: 4.5 mmol/L (ref 3.5–5.2)
Sodium: 133 mmol/L — ABNORMAL LOW (ref 134–144)
Total Protein: 6 g/dL (ref 6.0–8.5)

## 2018-03-04 ENCOUNTER — Other Ambulatory Visit: Payer: Self-pay

## 2018-03-04 ENCOUNTER — Encounter
Admission: RE | Admit: 2018-03-04 | Discharge: 2018-03-04 | Disposition: A | Discharge status: Home / Self Care | Payer: Medicare Other | Source: Ambulatory Visit | Attending: Specialist | Admitting: Specialist

## 2018-03-04 DIAGNOSIS — Z01818 Encounter for other preprocedural examination: Secondary | ICD-10-CM | POA: Insufficient documentation

## 2018-03-04 NOTE — Patient Instructions (Signed)
Your procedure is scheduled on: Monday 03/08/18 Report to [place]. To find out your arrival time please call [phone] between 1PM - 3PM on Friday 03/05/18.  Remember: Instructions that are not followed completely may result in serious medical risk, up to and including death, or upon the discretion of your surgeon and anesthesiologist your surgery may need to be rescheduled.     _X__ 1. Do not eat food after midnight the night before your procedure.                 No gum chewing or hard candies. You may drink clear liquids up to 2 hours                 before you are scheduled to arrive for your surgery- DO not drink clear                 liquids within 2 hours of the start of your surgery.                 Clear Liquids include:  water, apple juice without pulp, clear carbohydrate                 drink such as Clearfast or Gatorade, Black Coffee or Tea (Do not add                 anything to coffee or tea).  __X__2.  On the morning of surgery brush your teeth with toothpaste and water, you                 may rinse your mouth with mouthwash if you wish.  Do not swallow any              toothpaste of mouthwash.     _X__ 3.  No Alcohol for 24 hours before or after surgery.   _X__ 4.  Do Not Smoke or use e-cigarettes For 24 Hours Prior to Your Surgery.                 Do not use any chewable tobacco products for at least 6 hours prior to                 surgery.  ____  5.  Bring all medications with you on the day of surgery if instructed.   __X__  6.  Notify your doctor if there is any change in your medical condition      (cold, fever, infections).     Do not wear jewelry, make-up, hairpins, clips or nail polish. Do not wear lotions, powders, or perfumes.  Do not shave 48 hours prior to surgery. Men may shave face and neck. Do not bring valuables to the hospital.    [facility] is not responsible for any belongings or  valuables.  Contacts, dentures/partials or body piercings may not be worn into surgery. Bring a case for your contacts, glasses or hearing aids, a denture cup will be supplied. Leave your suitcase in the car. After surgery it may be brought to your room. For patients admitted to the hospital, discharge time is determined by your treatment team.   Patients discharged the day of surgery will not be allowed to drive home.   Please read over the following fact sheets that you were given:   MRSA Information  __X__ Take these medicines the morning of surgery with A SIP OF WATER:  1. amLODipine (NORVASC)   2.   3.   4.  5.  6.  ____ Fleet Enema (as directed)   __X__ Use CHG Soap/SAGE wipes as directed  ____ Use inhalers on the day of surgery  ____ Stop metformin/Janumet/Farxiga 2 days prior to surgery    ____ Take 1/2 of usual insulin dose the night before surgery. No insulin the morning          of surgery.   ____ Stop Blood Thinners Coumadin/Plavix/Xarelto/Pleta/Pradaxa/Eliquis/Effient/Aspirin  on   Or contact your Surgeon, Cardiologist or Medical Doctor regarding  ability to stop your blood thinners  __X__ Stop Anti-inflammatories 7 days before surgery such as Advil, Ibuprofen, Motrin,  BC or Goodies Powder, Naprosyn, Naproxen, Aleve, Aspirin    __X__ Stop all herbal supplements, fish oil or vitamin E until after surgery.    ____ Bring C-Pap to the hospital.

## 2018-03-07 MED ORDER — CLINDAMYCIN PHOSPHATE 600 MG/50ML IV SOLN
600.0000 mg | INTRAVENOUS | Status: AC
  Administered 2018-03-08: 600 mg via INTRAVENOUS

## 2018-03-07 MED ORDER — CEFAZOLIN SODIUM-DEXTROSE 2-4 GM/100ML-% IV SOLN
2.0000 g | INTRAVENOUS | Status: AC
  Administered 2018-03-08: 2 g via INTRAVENOUS

## 2018-03-08 ENCOUNTER — Other Ambulatory Visit: Payer: Self-pay

## 2018-03-08 ENCOUNTER — Ambulatory Visit: Payer: Medicare Other | Admitting: Certified Registered"

## 2018-03-08 ENCOUNTER — Ambulatory Visit
Admission: RE | Admit: 2018-03-08 | Discharge: 2018-03-08 | Disposition: A | Discharge status: Home / Self Care | Payer: Medicare Other | Source: Ambulatory Visit | Attending: Specialist | Admitting: Specialist

## 2018-03-08 ENCOUNTER — Encounter
Admission: RE | Disposition: A | Discharge status: Home / Self Care | Payer: Self-pay | Source: Ambulatory Visit | Attending: Specialist

## 2018-03-08 ENCOUNTER — Encounter: Payer: Self-pay | Admitting: *Deleted

## 2018-03-08 DIAGNOSIS — E785 Hyperlipidemia, unspecified: Secondary | ICD-10-CM | POA: Diagnosis not present

## 2018-03-08 DIAGNOSIS — Z79899 Other long term (current) drug therapy: Secondary | ICD-10-CM | POA: Insufficient documentation

## 2018-03-08 DIAGNOSIS — Z7982 Long term (current) use of aspirin: Secondary | ICD-10-CM | POA: Insufficient documentation

## 2018-03-08 DIAGNOSIS — I10 Essential (primary) hypertension: Secondary | ICD-10-CM | POA: Diagnosis not present

## 2018-03-08 DIAGNOSIS — G5601 Carpal tunnel syndrome, right upper limb: Secondary | ICD-10-CM | POA: Diagnosis not present

## 2018-03-08 DIAGNOSIS — Z87891 Personal history of nicotine dependence: Secondary | ICD-10-CM | POA: Insufficient documentation

## 2018-03-08 DIAGNOSIS — F419 Anxiety disorder, unspecified: Secondary | ICD-10-CM | POA: Insufficient documentation

## 2018-03-08 HISTORY — PX: CARPAL TUNNEL RELEASE: SHX101

## 2018-03-08 SURGERY — CARPAL TUNNEL RELEASE
Anesthesia: General | Site: Hand | Laterality: Right | Wound class: "Clean "

## 2018-03-08 MED ORDER — CEFAZOLIN SODIUM-DEXTROSE 2-4 GM/100ML-% IV SOLN
INTRAVENOUS | Status: AC
  Filled 2018-03-08: qty 100

## 2018-03-08 MED ORDER — ONDANSETRON HCL 4 MG/2ML IJ SOLN
INTRAMUSCULAR | Status: DC | PRN
  Administered 2018-03-08: 4 mg via INTRAVENOUS

## 2018-03-08 MED ORDER — GABAPENTIN 400 MG PO CAPS: 400.0000 mg | ORAL_CAPSULE | Freq: Two times a day (BID) | ORAL | 3 refills | Status: DC

## 2018-03-08 MED ORDER — PHENYLEPHRINE HCL 10 MG/ML IJ SOLN
INTRAMUSCULAR | Status: DC | PRN
  Administered 2018-03-08: 100 ug via INTRAVENOUS

## 2018-03-08 MED ORDER — HYDROCODONE-ACETAMINOPHEN 5-325 MG PO TABS: 1.0000 | ORAL_TABLET | Freq: Four times a day (QID) | ORAL | 0 refills | Status: DC | PRN

## 2018-03-08 MED ORDER — MELOXICAM 7.5 MG PO TABS
15.0000 mg | ORAL_TABLET | ORAL | Status: AC
  Administered 2018-03-08: 15 mg via ORAL

## 2018-03-08 MED ORDER — BUPIVACAINE HCL (PF) 0.5 % IJ SOLN
INTRAMUSCULAR | Status: AC
  Filled 2018-03-08: qty 30

## 2018-03-08 MED ORDER — CLINDAMYCIN PHOSPHATE 600 MG/50ML IV SOLN
INTRAVENOUS | Status: AC
  Filled 2018-03-08: qty 50

## 2018-03-08 MED ORDER — FENTANYL CITRATE (PF) 100 MCG/2ML IJ SOLN: 25.0000 ug | INTRAMUSCULAR | Status: DC | PRN

## 2018-03-08 MED ORDER — LIDOCAINE HCL (PF) 2 % IJ SOLN
INTRAMUSCULAR | Status: AC
  Filled 2018-03-08: qty 10

## 2018-03-08 MED ORDER — FENTANYL CITRATE (PF) 100 MCG/2ML IJ SOLN
INTRAMUSCULAR | Status: DC | PRN
  Administered 2018-03-08 (×2): 50 ug via INTRAVENOUS

## 2018-03-08 MED ORDER — EPHEDRINE SULFATE 50 MG/ML IJ SOLN
INTRAMUSCULAR | Status: DC | PRN
  Administered 2018-03-08: 10 mg via INTRAVENOUS
  Administered 2018-03-08 (×2): 5 mg via INTRAVENOUS

## 2018-03-08 MED ORDER — ONDANSETRON HCL 4 MG/2ML IJ SOLN: 4.0000 mg | Freq: Once | INTRAMUSCULAR | Status: DC | PRN

## 2018-03-08 MED ORDER — EPHEDRINE SULFATE 50 MG/ML IJ SOLN
INTRAMUSCULAR | Status: AC
  Filled 2018-03-08: qty 1

## 2018-03-08 MED ORDER — LIDOCAINE HCL (CARDIAC) PF 100 MG/5ML IV SOSY
PREFILLED_SYRINGE | INTRAVENOUS | Status: DC | PRN
  Administered 2018-03-08: 100 mg via INTRAVENOUS

## 2018-03-08 MED ORDER — PROPOFOL 10 MG/ML IV BOLUS
INTRAVENOUS | Status: DC | PRN
  Administered 2018-03-08: 140 mg via INTRAVENOUS

## 2018-03-08 MED ORDER — PHENYLEPHRINE HCL 10 MG/ML IJ SOLN
INTRAMUSCULAR | Status: AC
  Filled 2018-03-08: qty 1

## 2018-03-08 MED ORDER — MELOXICAM 7.5 MG PO TABS
ORAL_TABLET | ORAL | Status: AC
  Filled 2018-03-08: qty 2

## 2018-03-08 MED ORDER — MELOXICAM 15 MG PO TABS: 15.0000 mg | ORAL_TABLET | Freq: Every day | ORAL | 3 refills | Status: DC

## 2018-03-08 MED ORDER — SODIUM CHLORIDE 0.9 % IV SOLN
INTRAVENOUS | Status: DC
  Administered 2018-03-08: 07:00:00 via INTRAVENOUS

## 2018-03-08 MED ORDER — GABAPENTIN 300 MG PO CAPS
ORAL_CAPSULE | ORAL | Status: AC
  Filled 2018-03-08: qty 1

## 2018-03-08 MED ORDER — ONDANSETRON HCL 4 MG/2ML IJ SOLN
INTRAMUSCULAR | Status: AC
  Filled 2018-03-08: qty 2

## 2018-03-08 MED ORDER — PROPOFOL 10 MG/ML IV BOLUS
INTRAVENOUS | Status: AC
  Filled 2018-03-08: qty 20

## 2018-03-08 MED ORDER — FAMOTIDINE 20 MG PO TABS
ORAL_TABLET | ORAL | Status: AC
  Filled 2018-03-08: qty 1

## 2018-03-08 MED ORDER — BUPIVACAINE HCL 0.5 % IJ SOLN
INTRAMUSCULAR | Status: DC | PRN
  Administered 2018-03-08: 15 mL

## 2018-03-08 MED ORDER — CHLORHEXIDINE GLUCONATE CLOTH 2 % EX PADS: 6.0000 | MEDICATED_PAD | Freq: Once | CUTANEOUS | Status: DC

## 2018-03-08 MED ORDER — FENTANYL CITRATE (PF) 100 MCG/2ML IJ SOLN
INTRAMUSCULAR | Status: AC
  Filled 2018-03-08: qty 2

## 2018-03-08 MED ORDER — GABAPENTIN 300 MG PO CAPS
300.0000 mg | ORAL_CAPSULE | ORAL | Status: AC
  Administered 2018-03-08: 300 mg via ORAL

## 2018-03-08 MED ORDER — FAMOTIDINE 20 MG PO TABS
20.0000 mg | ORAL_TABLET | Freq: Once | ORAL | Status: AC
  Administered 2018-03-08: 20 mg via ORAL

## 2018-03-08 SURGICAL SUPPLY — 27 items
BLADE SURG MINI STRL (BLADE) ×3 IMPLANT
BNDG ESMARK 4X12 TAN STRL LF (GAUZE/BANDAGES/DRESSINGS) ×3 IMPLANT
CANISTER SUCT 1200ML W/VALVE (MISCELLANEOUS) ×1 IMPLANT
CHLORAPREP W/TINT 26ML (MISCELLANEOUS) ×3 IMPLANT
CUFF TOURN 18 STER (MISCELLANEOUS) IMPLANT
DRSG GAUZE FLUFF 36X18 (GAUZE/BANDAGES/DRESSINGS) ×3 IMPLANT
ELECT REM PT RETURN 9FT ADLT (ELECTROSURGICAL)
ELECTRODE REM PT RTRN 9FT ADLT (ELECTROSURGICAL) ×1 IMPLANT
GAUZE PETRO XEROFOAM 1X8 (MISCELLANEOUS) ×3 IMPLANT
GLOVE BIO SURGEON STRL SZ8 (GLOVE) ×3 IMPLANT
GOWN STRL REUS W/ TWL LRG LVL3 (GOWN DISPOSABLE) ×1 IMPLANT
GOWN STRL REUS W/TWL LRG LVL3 (GOWN DISPOSABLE) ×2
GOWN STRL REUS W/TWL LRG LVL4 (GOWN DISPOSABLE) ×3 IMPLANT
KIT TURNOVER KIT A (KITS) ×3 IMPLANT
NS IRRIG 500ML POUR BTL (IV SOLUTION) ×3 IMPLANT
PACK EXTREMITY ARMC (MISCELLANEOUS) ×3 IMPLANT
PAD PREP 24X41 OB/GYN DISP (PERSONAL CARE ITEMS) ×3 IMPLANT
PADDING CAST 4IN STRL (MISCELLANEOUS) ×2
PADDING CAST BLEND 4X4 STRL (MISCELLANEOUS) ×1 IMPLANT
SPLINT CAST 1 STEP 3X12 (MISCELLANEOUS) ×3 IMPLANT
STOCKINETTE 48X4 2 PLY STRL (GAUZE/BANDAGES/DRESSINGS) ×1 IMPLANT
STOCKINETTE BIAS CUT 4 980044 (GAUZE/BANDAGES/DRESSINGS) ×3 IMPLANT
STOCKINETTE STRL 4IN 9604848 (GAUZE/BANDAGES/DRESSINGS) ×3 IMPLANT
SUT ETHILON 4-0 (SUTURE) ×2
SUT ETHILON 4-0 FS2 18XMFL BLK (SUTURE) ×1
SUT ETHILON 5-0 FS-2 18 BLK (SUTURE) ×1 IMPLANT
SUTURE ETHLN 4-0 FS2 18XMF BLK (SUTURE) ×1 IMPLANT

## 2018-03-08 NOTE — Anesthesia Preprocedure Evaluation (Signed)
Anesthesia Evaluation  Patient identified by MRN, date of birth, ID band Patient awake    Reviewed: Allergy & Precautions, NPO status , Patient's Chart, lab work & pertinent test results  History of Anesthesia Complications Negative for: history of anesthetic complications  Airway Mallampati: I       Dental  (+) Partial Lower, Upper Dentures, Dental Advidsory Given   Pulmonary neg pulmonary ROS, former smoker,           Cardiovascular Exercise Tolerance: Good hypertension, Pt. on medications (-) angina(-) CAD, (-) Past MI, (-) Cardiac Stents and (-) CABG (-) dysrhythmias (-) Valvular Problems/Murmurs     Neuro/Psych PSYCHIATRIC DISORDERS Anxiety negative neurological ROS     GI/Hepatic negative GI ROS, Neg liver ROS,   Endo/Other  negative endocrine ROSneg diabetes  Renal/GU CRFRenal disease     Musculoskeletal   Abdominal   Peds  Hematology negative hematology ROS (+)   Anesthesia Other Findings Past Medical History: No date: Anxiety No date: Chronic kidney disease No date: Hyperlipidemia No date: Hypertension No date: Insomnia No date: Thyroid disease   Reproductive/Obstetrics                             Anesthesia Physical  Anesthesia Plan  ASA: II  Anesthesia Plan: General   Post-op Pain Management:    Induction: Intravenous  PONV Risk Score and Plan: 2 and Ondansetron and Dexamethasone  Airway Management Planned: LMA  Additional Equipment:   Intra-op Plan:   Post-operative Plan: Extubation in OR  Informed Consent: I have reviewed the patients History and Physical, chart, labs and discussed the procedure including the risks, benefits and alternatives for the proposed anesthesia with the patient or authorized representative who has indicated his/her understanding and acceptance.     Plan Discussed with: Anesthesiologist and CRNA  Anesthesia Plan Comments:          Anesthesia Quick Evaluation

## 2018-03-08 NOTE — Transfer of Care (Signed)
Immediate Anesthesia Transfer of Care Note  Patient: Roger Byrd  Procedure(s) Performed: CARPAL TUNNEL RELEASE (Right Hand)  Patient Location: PACU  Anesthesia Type:General  Level of Consciousness: awake, alert  and responds to stimulation  Airway & Oxygen Therapy: Patient Spontanous Breathing and Patient connected to face mask oxygen  Post-op Assessment: Report given to RN and Post -op Vital signs reviewed and stable  Post vital signs: Reviewed and stable  Last Vitals:  Vitals Value Taken Time  BP 102/57 03/08/2018  8:37 AM  Temp    Pulse 81 03/08/2018  8:37 AM  Resp    SpO2 100 % 03/08/2018  8:37 AM    Last Pain:  Vitals:   03/08/18 0624  TempSrc: Oral  PainSc: 0-No pain         Complications: No apparent anesthesia complications

## 2018-03-08 NOTE — H&P (Signed)
THE PATIENT WAS SEEN PRIOR TO SURGERY TODAY.  HISTORY, ALLERGIES, HOME MEDICATIONS AND OPERATIVE PROCEDURE WERE REVIEWED. RISKS AND BENEFITS OF SURGERY DISCUSSED WITH PATIENT AGAIN.  NO CHANGES FROM INITIAL HISTORY AND PHYSICAL NOTED.    

## 2018-03-08 NOTE — Anesthesia Postprocedure Evaluation (Signed)
Anesthesia Post Note  Patient: Roger Byrd  Procedure(s) Performed: CARPAL TUNNEL RELEASE (Right Hand)  Patient location during evaluation: PACU Anesthesia Type: General Level of consciousness: awake and alert Pain management: pain level controlled Vital Signs Assessment: post-procedure vital signs reviewed and stable Respiratory status: spontaneous breathing, nonlabored ventilation, respiratory function stable and patient connected to nasal cannula oxygen Cardiovascular status: blood pressure returned to baseline and stable Postop Assessment: no apparent nausea or vomiting Anesthetic complications: no     Last Vitals:  Vitals:   03/08/18 0916 03/08/18 0946  BP: (!) 121/58 (!) 124/55  Pulse: 68 66  Resp: 16   Temp: 36.7 C   SpO2: 99%     Last Pain:  Vitals:   03/08/18 0916  TempSrc: Oral  PainSc: 0-No pain                 Martha Clan

## 2018-03-08 NOTE — Op Note (Signed)
03/08/2018  8:32 AM  PATIENT:  Roger Byrd    PRE-OPERATIVE DIAGNOSIS: RIGHT CARPAL TUNNEL SYNDROME POST-OPERATIVE DIAGNOSIS: RIGHT CARPAL TUNNEL SYNDROME  PROCEDURE:  RIGHT CARPAL TUNNEL RELEASE  SURGEON: Park Breed, MD    ANESTHESIA:   General  TOURNIQUET TIME: 17   MIN  PREOPERATIVE INDICATIONS:  Roger Byrd is a  77 y.o. male with a diagnosis of right carpal tunnel syndrome who failed conservative measures and elected for surgical management.    The risks benefits and alternatives were discussed with the patient preoperatively including but not limited to the risks of infection, bleeding, nerve injury, incomplete relief of symptoms, pillar pain, cardiopulmonary complications, the need for revision surgery, among others, and the patient was willing to proceed.  OPERATIVE FINDINGS: Thickened volar ligament and nerve compression.  OPERATIVE PROCEDURE: The patient is brought to the operating room placed in the supine position. General anesthesia was administered. The right upper extremity was prepped and draped in usual sterile fashion. Time out was performed. The arm was elevated and exsanguinated and the tourniquet was inflated. Incision was made in line with the radial border of the ring finger. The carpal tunnel transverse fascia was identified, cleaned, and incised sharply. The common sensory branches were visualized along with the superficial palmar arch and protected.  The median nerve was protected below  A Kelly clamp was  placed underneath the transverse carpal ligament, protecting the nerve. I released the ligament completely, and then released the proximal distal volar forearm fascia. The nerve was identified, and visualized, and protected throughout the case. The motor branch was intact upon inspection.  No masses or abnormalities were identified in ulnar bursa.  The wounds were irrigated copiously, and the skin closed with nylon. The wound was injected with 1/2%  marcaine followed by a sterile dressing and  volar splint .  Tourniquet was deflated with good return of blood flow to all fingers. Sponge and needle counts were correct.  The patient tolerated this well, with no complications. The patient was awakened and taken to recovery in good condition.

## 2018-03-08 NOTE — Discharge Instructions (Signed)

## 2018-03-08 NOTE — Anesthesia Post-op Follow-up Note (Signed)
Anesthesia QCDR form completed.        

## 2018-03-08 NOTE — Anesthesia Procedure Notes (Signed)
Procedure Name: LMA Insertion Performed by: Lavon Horn, CRNA Pre-anesthesia Checklist: Patient identified, Patient being monitored, Timeout performed, Emergency Drugs available and Suction available Patient Re-evaluated:Patient Re-evaluated prior to induction Oxygen Delivery Method: Circle system utilized Preoxygenation: Pre-oxygenation with 100% oxygen Induction Type: IV induction LMA: LMA inserted LMA Size: 4.5 Tube type: Oral Number of attempts: 1 Placement Confirmation: positive ETCO2 and breath sounds checked- equal and bilateral Tube secured with: Tape Dental Injury: Teeth and Oropharynx as per pre-operative assessment        

## 2018-03-09 ENCOUNTER — Encounter: Payer: Self-pay | Admitting: Specialist

## 2018-03-31 ENCOUNTER — Other Ambulatory Visit: Payer: Self-pay | Admitting: Family Medicine

## 2018-03-31 MED ORDER — ATORVASTATIN CALCIUM 40 MG PO TABS: 40.0000 mg | ORAL_TABLET | Freq: Every day | ORAL | 0 refills | Status: DC

## 2018-03-31 NOTE — Telephone Encounter (Signed)
Requested Prescriptions  Pending Prescriptions Disp Refills  . atorvastatin (LIPITOR) 40 MG tablet 30 tablet 0    Sig: Take 1 tablet (40 mg total) by mouth daily at 6 PM.     Cardiovascular:  Antilipid - Statins Failed - 03/31/2018  9:35 AM      Failed - Total Cholesterol in normal range and within 360 days    Cholesterol, Total  Date Value Ref Range Status  03/16/2017 164 100 - 199 mg/dL Final   Cholesterol Piccolo, Waived  Date Value Ref Range Status  05/01/2015 185 <200 mg/dL Final    Comment:                            Desirable                <200                         Borderline High      200- 239                         High                     >239          Failed - LDL in normal range and within 360 days    LDL Calculated  Date Value Ref Range Status  03/16/2017 77 0 - 99 mg/dL Final         Failed - HDL in normal range and within 360 days    HDL  Date Value Ref Range Status  03/16/2017 54 >39 mg/dL Final         Failed - Triglycerides in normal range and within 360 days    Triglycerides  Date Value Ref Range Status  03/16/2017 165 (H) 0 - 149 mg/dL Final   Triglycerides Piccolo,Waived  Date Value Ref Range Status  05/01/2015 111 <150 mg/dL Final    Comment:                            Normal                   <150                         Borderline High     150 - 199                         High                200 - 499                         Very High                >499          Passed - Patient is not pregnant      Passed - Valid encounter within last 12 months    Recent Outpatient Visits          1 month ago Pre-operative clearance   [facility] Carles Collet M, PA-C   1 month ago Impingement syndrome of left shoulder   [facility] Carles Collet M, PA-C   5  months ago Bilateral carpal tunnel syndrome   [facility] Kathrine Haddock, NP   6 months ago Viral upper respiratory tract infection   [facility] Kathrine Haddock, NP   1 year ago Essential hypertension   [place], Velma, NP      Future Appointments            In 1 month Kathrine Haddock, NP [facility]          Lipids past due. 30 day courtesy refill given. Next OV in one month. month.

## 2018-03-31 NOTE — Telephone Encounter (Signed)
Copied from [place] [phone]. Topic: Quick Communication - Rx Refill/Question >> Mar 31, 2018  9:32 AM Antonieta Iba C wrote: Medication: atorvastatin (LIPITOR) 40 MG tablet   Has the patient contacted their pharmacy? Yes   (Agent: If no, request that the patient contact the pharmacy for the refill.) (Agent: If yes, when and what did the pharmacy advise?)  Preferred Pharmacy (with phone number or street name): [place], [place] - [address] [phone] (Phone) [phone] (Fax)    Agent: Please be advised that RX refills may take up to 3 business days. We ask that you follow-up with your pharmacy.

## 2018-04-26 ENCOUNTER — Other Ambulatory Visit: Payer: Self-pay | Admitting: Family Medicine

## 2018-04-30 ENCOUNTER — Encounter: Payer: Self-pay | Admitting: Unknown Physician Specialty

## 2018-04-30 ENCOUNTER — Ambulatory Visit (INDEPENDENT_AMBULATORY_CARE_PROVIDER_SITE_OTHER): Payer: Medicare Other | Admitting: Unknown Physician Specialty

## 2018-04-30 ENCOUNTER — Other Ambulatory Visit: Payer: Self-pay

## 2018-04-30 VITALS — BP 132/70 | HR 76 | Temp 97.6°F | Ht 67.0 in | Wt 155.0 lb

## 2018-04-30 DIAGNOSIS — M25542 Pain in joints of left hand: Secondary | ICD-10-CM | POA: Diagnosis not present

## 2018-04-30 DIAGNOSIS — I10 Essential (primary) hypertension: Secondary | ICD-10-CM

## 2018-04-30 DIAGNOSIS — E039 Hypothyroidism, unspecified: Secondary | ICD-10-CM

## 2018-04-30 DIAGNOSIS — E782 Mixed hyperlipidemia: Secondary | ICD-10-CM | POA: Diagnosis not present

## 2018-04-30 DIAGNOSIS — R7301 Impaired fasting glucose: Secondary | ICD-10-CM

## 2018-04-30 DIAGNOSIS — M25541 Pain in joints of right hand: Secondary | ICD-10-CM

## 2018-04-30 DIAGNOSIS — I129 Hypertensive chronic kidney disease with stage 1 through stage 4 chronic kidney disease, or unspecified chronic kidney disease: Secondary | ICD-10-CM

## 2018-04-30 DIAGNOSIS — N183 Chronic kidney disease, stage 3 (moderate): Secondary | ICD-10-CM

## 2018-04-30 MED ORDER — ATORVASTATIN CALCIUM 40 MG PO TABS: 40.0000 mg | ORAL_TABLET | Freq: Every day | ORAL | 1 refills | Status: DC

## 2018-04-30 MED ORDER — METHYLPREDNISOLONE 4 MG PO TBPK: ORAL_TABLET | ORAL | 0 refills | Status: DC

## 2018-04-30 NOTE — Assessment & Plan Note (Signed)
Not on medications Check TSH  

## 2018-04-30 NOTE — Assessment & Plan Note (Signed)
Check LDL.  Adjust medications if needed

## 2018-04-30 NOTE — Assessment & Plan Note (Signed)
-  Check HbA1c. 

## 2018-04-30 NOTE — Progress Notes (Signed)
BP 132/70   Pulse 76   Temp 97.6 F (36.4 C) (Oral)   Ht 5' 7"  (1.702 m)   Wt 155 lb (70.3 kg)   SpO2 99%   BMI 24.28 kg/m    Subjective:    Patient ID: Roger Byrd, male    DOB: Jan 07, 1941, 77 y.o.   MRN: [id number]  Chief Complaint  Patient presents with  . Hypertension    atorvastatin refill  . Hypothyroidism    6 m f/u  . Referral    pt requestes a referral to see a arthritis doctor  . Diabetes    pt has not had a recent eye exam   HPI: Roger Byrd is a 77 y.o. male here for follow-up appointment for hypertension, impaired fasting glucose, and new onset joint pain. Patient would like to see an arthiritis doctor and states that joint pain began 3 weeks ago in bilateral second fingers, middle phalangeal joints, accompanied by mild swelling, tenderness. Pain also in shoulders with movement and while sleeping since that time. He wakes up due to pain 4 to 5 times per night due to shoulder pain. Has tried tylenol without noticeable relief.  Hand pain is worse in the AM  Impaired fasting glucose Feet problems:none Blood Sugars averaging:Not checking Patient to schedule eye exam Last Hgb A1C: 6.0  Hypertension  Using medications without difficulty Average home BPs: around 130/70 per patient   Using medication without problems or lightheadedness No chest pain with exertion or shortness of breath No Edema  Elevated Cholesterol Using medications without problems No Muscle aches  Diet: Eating better Exercise:Not exercising due to hand pain  Relevant past medical, surgical, family and social history reviewed and updated as indicated. Interim medical history since our last visit reviewed. Allergies and medications reviewed and updated.  Review of Systems  Constitutional: Negative.   HENT: Negative.   Eyes: Negative.   Respiratory: Negative.   Cardiovascular: Negative.   Gastrointestinal: Negative.   Endocrine: Negative.   Genitourinary: Negative.     Musculoskeletal: Positive for arthralgias and joint swelling.  Neurological: Negative.   Psychiatric/Behavioral: Negative.     Per HPI unless specifically indicated above     Objective:    BP 132/70   Pulse 76   Temp 97.6 F (36.4 C) (Oral)   Ht 5' 7"  (1.702 m)   Wt 155 lb (70.3 kg)   SpO2 99%   BMI 24.28 kg/m   Wt Readings from Last 3 Encounters:  04/30/18 155 lb (70.3 kg)  03/08/18 150 lb (68 kg)  03/04/18 149 lb (67.6 kg)    Physical Exam  Constitutional: He is oriented to person, place, and time. He appears well-developed and well-nourished. No distress.  HENT:  Head: Normocephalic and atraumatic.  Eyes: Conjunctivae and lids are normal. Right eye exhibits no discharge. Left eye exhibits no discharge. No scleral icterus.  Neck: Normal range of motion. Neck supple. No JVD present. Carotid bruit is not present.  Cardiovascular: Normal rate, regular rhythm and normal heart sounds.  Pulmonary/Chest: Effort normal and breath sounds normal. No respiratory distress.  Abdominal: Normal appearance. There is no splenomegaly or hepatomegaly.  Musculoskeletal:       Right shoulder: He exhibits pain.       Left shoulder: He exhibits pain.  Bilateral third and forth finger PIP joint swelling.  Neurological: He is alert and oriented to person, place, and time.  Skin: Skin is warm, dry and intact. No rash noted. No pallor.  Psychiatric: He has a normal mood and affect. His behavior is normal. Judgment and thought content normal.    Results for orders placed or performed in visit on 02/26/18  Comp Met (CMET)  Result Value Ref Range   Glucose 92 65 - 99 mg/dL   BUN 34 (H) 8 - 27 mg/dL   Creatinine, Ser 1.44 (H) 0.76 - 1.27 mg/dL   GFR calc non Af Amer 47 (L) >59 mL/min/1.73   GFR calc Af Amer 54 (L) >59 mL/min/1.73   BUN/Creatinine Ratio 24 10 - 24   Sodium 133 (L) 134 - 144 mmol/L   Potassium 4.5 3.5 - 5.2 mmol/L   Chloride 95 (L) 96 - 106 mmol/L   CO2 22 20 - 29 mmol/L    Calcium 9.2 8.6 - 10.2 mg/dL   Total Protein 6.0 6.0 - 8.5 g/dL   Albumin 4.0 3.5 - 4.8 g/dL   Globulin, Total 2.0 1.5 - 4.5 g/dL   Albumin/Globulin Ratio 2.0 1.2 - 2.2   Bilirubin Total 0.4 0.0 - 1.2 mg/dL   Alkaline Phosphatase 70 39 - 117 IU/L   AST 15 0 - 40 IU/L   ALT 18 0 - 44 IU/L      Assessment & Plan:   Problem List Items Addressed This Visit      Unprioritized   Hyperlipidemia    Check LDL.  Adjust medications if needed      Relevant Medications   atorvastatin (LIPITOR) 40 MG tablet   Other Relevant Orders   Lipid Panel w/o Chol/HDL Ratio   Hypertension - Primary   Relevant Medications   atorvastatin (LIPITOR) 40 MG tablet   Other Relevant Orders   Comprehensive metabolic panel   Hypertensive CKD (chronic kidney disease)    Stable, continue present medications.        Hypothyroidism    Not on medications.  Check TSH      Relevant Orders   TSH   IFG (impaired fasting glucose)    Check HbA1c.      Relevant Orders   Bayer DCA Hb A1c Waived    Other Visit Diagnoses    Arthralgia of both hands       Check rheumatoid factor, sed rate, crp, lime titer. Start Medrol dose pack.   Relevant Orders   Sed Rate (ESR)   Rheumatoid Factor   Lyme Ab/Western Blot Reflex       Follow up plan: Return in about 6 months (around 10/29/2018).

## 2018-04-30 NOTE — Assessment & Plan Note (Signed)
Stable, continue present medications.   

## 2018-05-01 LAB — BAYER DCA HB A1C WAIVED: HB A1C (BAYER DCA - WAIVED): 5.7 % (ref <7.0)

## 2018-05-03 ENCOUNTER — Other Ambulatory Visit: Payer: Self-pay | Admitting: Unknown Physician Specialty

## 2018-05-03 DIAGNOSIS — N183 Chronic kidney disease, stage 3 unspecified: Secondary | ICD-10-CM

## 2018-05-04 LAB — COMPREHENSIVE METABOLIC PANEL
ALBUMIN: 4.6 g/dL (ref 3.5–4.8)
ALT: 15 IU/L (ref 0–44)
AST: 15 IU/L (ref 0–40)
Albumin/Globulin Ratio: 2.2 (ref 1.2–2.2)
Alkaline Phosphatase: 82 IU/L (ref 39–117)
BUN / CREAT RATIO: 20 (ref 10–24)
BUN: 33 mg/dL — AB (ref 8–27)
Bilirubin Total: 0.3 mg/dL (ref 0.0–1.2)
CALCIUM: 9.6 mg/dL (ref 8.6–10.2)
CO2: 21 mmol/L (ref 20–29)
CREATININE: 1.64 mg/dL — AB (ref 0.76–1.27)
Chloride: 98 mmol/L (ref 96–106)
GFR, EST AFRICAN AMERICAN: 46 mL/min/{1.73_m2} — AB (ref >59)
GFR, EST NON AFRICAN AMERICAN: 40 mL/min/{1.73_m2} — AB (ref >59)
Globulin, Total: 2.1 g/dL (ref 1.5–4.5)
Glucose: 131 mg/dL — ABNORMAL HIGH (ref 65–99)
Potassium: 4.2 mmol/L (ref 3.5–5.2)
Sodium: 137 mmol/L (ref 134–144)
TOTAL PROTEIN: 6.7 g/dL (ref 6.0–8.5)

## 2018-05-04 LAB — TSH: TSH: 5.37 u[IU]/mL — AB (ref 0.450–4.500)

## 2018-05-04 LAB — LIPID PANEL W/O CHOL/HDL RATIO
CHOLESTEROL TOTAL: 160 mg/dL (ref 100–199)
HDL: 51 mg/dL (ref >39)
LDL Calculated: 73 mg/dL (ref 0–99)
Triglycerides: 181 mg/dL — ABNORMAL HIGH (ref 0–149)
VLDL Cholesterol Cal: 36 mg/dL (ref 5–40)

## 2018-05-04 LAB — LYME AB/WESTERN BLOT REFLEX: LYME DISEASE AB, QUANT, IGM: <0.8 index (ref 0.00–0.79)

## 2018-05-04 LAB — RHEUMATOID FACTOR

## 2018-05-04 LAB — SEDIMENTATION RATE: SED RATE: 8 mm/h (ref 0–30)

## 2018-05-10 ENCOUNTER — Emergency Department
Admission: EM | Admit: 2018-05-10 | Discharge: 2018-05-10 | Disposition: A | Discharge status: Home / Self Care | Payer: Medicare Other | Attending: Emergency Medicine | Admitting: Emergency Medicine

## 2018-05-10 ENCOUNTER — Other Ambulatory Visit: Payer: Self-pay

## 2018-05-10 DIAGNOSIS — E039 Hypothyroidism, unspecified: Secondary | ICD-10-CM | POA: Diagnosis not present

## 2018-05-10 DIAGNOSIS — Z79899 Other long term (current) drug therapy: Secondary | ICD-10-CM | POA: Insufficient documentation

## 2018-05-10 DIAGNOSIS — I129 Hypertensive chronic kidney disease with stage 1 through stage 4 chronic kidney disease, or unspecified chronic kidney disease: Secondary | ICD-10-CM | POA: Diagnosis not present

## 2018-05-10 DIAGNOSIS — Z87891 Personal history of nicotine dependence: Secondary | ICD-10-CM | POA: Diagnosis not present

## 2018-05-10 DIAGNOSIS — I959 Hypotension, unspecified: Secondary | ICD-10-CM | POA: Diagnosis not present

## 2018-05-10 DIAGNOSIS — R42 Dizziness and giddiness: Secondary | ICD-10-CM | POA: Diagnosis present

## 2018-05-10 DIAGNOSIS — E785 Hyperlipidemia, unspecified: Secondary | ICD-10-CM | POA: Insufficient documentation

## 2018-05-10 DIAGNOSIS — R55 Syncope and collapse: Secondary | ICD-10-CM | POA: Diagnosis not present

## 2018-05-10 DIAGNOSIS — N189 Chronic kidney disease, unspecified: Secondary | ICD-10-CM | POA: Diagnosis not present

## 2018-05-10 DIAGNOSIS — I951 Orthostatic hypotension: Secondary | ICD-10-CM | POA: Diagnosis not present

## 2018-05-10 DIAGNOSIS — M6281 Muscle weakness (generalized): Secondary | ICD-10-CM | POA: Diagnosis not present

## 2018-05-10 LAB — CBC WITH DIFFERENTIAL/PLATELET
Abs Immature Granulocytes: 0.07 10*3/uL (ref 0.00–0.07)
BASOS PCT: 0 %
Basophils Absolute: 0 10*3/uL (ref 0.0–0.1)
EOS ABS: 0.2 10*3/uL (ref 0.0–0.5)
Eosinophils Relative: 3 %
HEMATOCRIT: 38 % — AB (ref 39.0–52.0)
Hemoglobin: 12.3 g/dL — ABNORMAL LOW (ref 13.0–17.0)
Immature Granulocytes: 1 %
LYMPHS ABS: 1.7 10*3/uL (ref 0.7–4.0)
Lymphocytes Relative: 25 %
MCH: 26.4 pg (ref 26.0–34.0)
MCHC: 32.4 g/dL (ref 30.0–36.0)
MCV: 81.5 fL (ref 80.0–100.0)
MONO ABS: 0.5 10*3/uL (ref 0.1–1.0)
MONOS PCT: 8 %
Neutro Abs: 4.2 10*3/uL (ref 1.7–7.7)
Neutrophils Relative %: 63 %
Platelets: 306 10*3/uL (ref 150–400)
RBC: 4.66 MIL/uL (ref 4.22–5.81)
RDW: 13.8 % (ref 11.5–15.5)
WBC: 6.7 10*3/uL (ref 4.0–10.5)
nRBC: 0 % (ref 0.0–0.2)

## 2018-05-10 LAB — COMPREHENSIVE METABOLIC PANEL
ALT: 16 U/L (ref 0–44)
AST: 18 U/L (ref 15–41)
Albumin: 4 g/dL (ref 3.5–5.0)
Alkaline Phosphatase: 62 U/L (ref 38–126)
Anion gap: 9 (ref 5–15)
BUN: 37 mg/dL — ABNORMAL HIGH (ref 8–23)
CO2: 23 mmol/L (ref 22–32)
Calcium: 8.2 mg/dL — ABNORMAL LOW (ref 8.9–10.3)
Chloride: 102 mmol/L (ref 98–111)
Creatinine, Ser: 1.74 mg/dL — ABNORMAL HIGH (ref 0.61–1.24)
GFR calc Af Amer: 42 mL/min — ABNORMAL LOW (ref >60)
GFR calc non Af Amer: 36 mL/min — ABNORMAL LOW (ref >60)
Glucose, Bld: 161 mg/dL — ABNORMAL HIGH (ref 70–99)
Potassium: 3.9 mmol/L (ref 3.5–5.1)
Sodium: 134 mmol/L — ABNORMAL LOW (ref 135–145)
Total Bilirubin: 0.6 mg/dL (ref 0.3–1.2)
Total Protein: 6.9 g/dL (ref 6.5–8.1)

## 2018-05-10 LAB — URINALYSIS, COMPLETE (UACMP) WITH MICROSCOPIC
BACTERIA UA: NONE SEEN
BILIRUBIN URINE: NEGATIVE
Glucose, UA: 150 mg/dL — AB
HGB URINE DIPSTICK: NEGATIVE
KETONES UR: NEGATIVE mg/dL
LEUKOCYTES UA: NEGATIVE
Nitrite: NEGATIVE
PROTEIN: NEGATIVE mg/dL
SPECIFIC GRAVITY, URINE: 1.017 (ref 1.005–1.030)
pH: 5 (ref 5.0–8.0)

## 2018-05-10 LAB — TROPONIN I: Troponin I: <0.03 ng/mL (ref <0.03)

## 2018-05-10 MED ORDER — SODIUM CHLORIDE 0.9 % IV BOLUS
1000.0000 mL | Freq: Once | INTRAVENOUS | Status: AC
  Administered 2018-05-10: 1000 mL via INTRAVENOUS

## 2018-05-10 NOTE — ED Triage Notes (Signed)
Pt brought in by [facility] due to weakness since Saturday. States he was playing golf on Saturday when he began to feel weak. Denies chest pain. Denies shortness of breath. Denies dizziness. States his blood pressure has been reading low at home. States he has still be able to function normally at home.

## 2018-05-10 NOTE — ED Provider Notes (Signed)
[facility] Emergency Department Provider Note  ___________________________________________   First MD Initiated Contact with Patient 05/10/18 1532     (approximate)  I have reviewed the triage vital signs and the nursing notes.   HISTORY  Chief Complaint Weakness   HPI Roger Byrd is a 77 y.o. male history of hypertension as well as chronic kidney disease was presented to the emergency department today with multiple episodes of lightheadedness over the past 3 days.  He says that while playing golf this past Saturday he became very lightheaded.  However, the episode resolved spontaneously.  He had another episode today while loading his car after walking in [place] for approximately 20 minutes.  He says that he was very lightheaded and felt like he was going to pass out.  Denies any shortness of breath, chest pain or palpitations.  Says that he took his blood pressure when he arrived at home and it was 90 systolic.  Fire rescue was called and the patient was 518 systolic while sitting but dropped to 60 while standing up.  Patient says that he has recently changed his diet and dropped all her most sodium from his diet over the past week.  He says that his wife died in 09/26/2013 and after that he started eating a low quality diet high in sodium consistent mostly prepared foods and hot dogs.  He says that over the past week he has eaten cabbage and on season beans.  Says that he drinks water and does not drink alcohol.  Also recently referred to a kidney specialist because of worsening kidney function.  Last blood taken on November 1.   Past Medical History:  Diagnosis Date  . Anxiety   . Chronic kidney disease   . Hyperlipidemia   . Hypertension   . Insomnia   . Thyroid disease     Patient Active Problem List   Diagnosis Date Noted  . Acquired trigger finger of left ring finger 11/24/2017  . Carpal tunnel syndrome 10/26/2017  . IFG (impaired fasting glucose)  09/14/2017  . Advanced care planning/counseling discussion 05/27/2016  . CKD (chronic kidney disease) 05/01/2015  . Hypertension 04/30/2015  . Hyperlipidemia 04/30/2015  . Hypothyroidism 04/30/2015  . Insomnia 04/30/2015  . Hypertensive CKD (chronic kidney disease) 04/30/2015    Past Surgical History:  Procedure Laterality Date  . BACK SURGERY     x2  . CARPAL TUNNEL RELEASE Right 03/08/2018   Procedure: CARPAL TUNNEL RELEASE;  Surgeon: Earnestine Leys, MD;  Location: [facility] ORS;  Service: Orthopedics;  Laterality: Right;  . COLONOSCOPY WITH PROPOFOL N/A 07/19/2015   Procedure: COLONOSCOPY WITH PROPOFOL;  Surgeon: Manya Silvas, MD;  Location: [facility] ENDOSCOPY;  Service: Endoscopy;  Laterality: N/A;    Prior to Admission medications   Medication Sig Start Date End Date Taking? Authorizing Provider  amLODipine (NORVASC) 5 MG tablet TAKE 1 TABLET BY MOUTH DAILY 01/05/18   Kathrine Haddock, NP  aspirin 81 MG tablet Take 81 mg by mouth every evening.     [provider]  atorvastatin (LIPITOR) 40 MG tablet Take 1 tablet (40 mg total) by mouth daily at 6 PM. 04/30/18   Kathrine Haddock, NP  gabapentin (NEURONTIN) 400 MG capsule Take 1 capsule (400 mg total) by mouth 2 (two) times daily. 03/08/18   Earnestine Leys, MD  hydrochlorothiazide (HYDRODIURIL) 25 MG tablet TAKE 1 TABLET BY MOUTH EVERY DAY 11/30/17   Kathrine Haddock, NP  lisinopril (PRINIVIL,ZESTRIL) 40 MG tablet Take 1 tablet (  40 mg total) by mouth daily. 06/16/17   Kathrine Haddock, NP  meloxicam (MOBIC) 15 MG tablet Take 1 tablet (15 mg total) by mouth daily. 03/08/18   Earnestine Leys, MD  methylPREDNISolone (MEDROL DOSEPAK) 4 MG TBPK tablet As directed 04/30/18   Kathrine Haddock, NP    Allergies Patient has no known allergies.  Family History  Problem Relation Age of Onset  . Hypertension Mother   . Heart disease Mother        heart attack  . Hypertension Sister   . Emphysema Father   . Hypertension Daughter   . Hypertension Son     . Hypertension Sister   . Hypertension Sister   . Emphysema Sister     Social History Social History   Tobacco Use  . Smoking status: Former Smoker    Last attempt to quit: 08/08/1970    Years since quitting: 47.7  . Smokeless tobacco: Never Used  Substance Use Topics  . Alcohol use: Yes    Alcohol/week: 14.0 standard drinks    Types: 14 Glasses of wine per week  . Drug use: No    Review of Systems  Constitutional: No fever/chills Eyes: No visual changes. ENT: No sore throat. Cardiovascular: Denies chest pain. Respiratory: Denies shortness of breath. Gastrointestinal: No abdominal pain.  No nausea, no vomiting.  No diarrhea.  No constipation. Genitourinary: Negative for dysuria. Musculoskeletal: Negative for back pain. Skin: Negative for rash. Neurological: Negative for headaches, focal weakness or numbness.   ____________________________________________   PHYSICAL EXAM:  VITAL SIGNS: ED Triage Vitals  Enc Vitals Group     BP 05/10/18 1531 127/85     Pulse Rate 05/10/18 1531 71     Resp 05/10/18 1531 16     Temp 05/10/18 1531 98.1 F (36.7 C)     Temp Source 05/10/18 1531 Oral     SpO2 05/10/18 1531 100 %     Weight 05/10/18 1556 153 lb (69.4 kg)     Height 05/10/18 1556 5\' 7"  (1.702 m)     Head Circumference --      Peak Flow --      Pain Score 05/10/18 1556 0     Pain Loc --      Pain Edu? --      Excl. in [place]? --     Constitutional: Alert and oriented. Well appearing and in no acute distress. Eyes: Conjunctivae are normal.  Head: Atraumatic. Nose: No congestion/rhinnorhea. Mouth/Throat: Mucous membranes are moist.  Neck: No stridor.   Cardiovascular: Normal rate, regular rhythm. Grossly normal heart sounds.   Respiratory: Normal respiratory effort.  No retractions. Lungs CTAB. Gastrointestinal: Soft and nontender. No distention.  Musculoskeletal: No lower extremity tenderness nor edema.  No joint effusions. Neurologic:  Normal speech and  language. No gross focal neurologic deficits are appreciated. Skin:  Skin is warm, dry and intact. No rash noted. Psychiatric: Mood and affect are normal. Speech and behavior are normal.  ____________________________________________   LABS (all labs ordered are listed, but only abnormal results are displayed)  Labs Reviewed  CBC WITH DIFFERENTIAL/PLATELET - Abnormal; Notable for the following components:      Result Value   Hemoglobin 12.3 (*)    HCT 38.0 (*)    All other components within normal limits  URINALYSIS, COMPLETE (UACMP) WITH MICROSCOPIC - Abnormal; Notable for the following components:   Color, Urine YELLOW (*)    APPearance CLEAR (*)    Glucose, UA 150 (*)    All  other components within normal limits  COMPREHENSIVE METABOLIC PANEL - Abnormal; Notable for the following components:   Sodium 134 (*)    Glucose, Bld 161 (*)    BUN 37 (*)    Creatinine, Ser 1.74 (*)    Calcium 8.2 (*)    GFR calc non Af Amer 36 (*)    GFR calc Af Amer 42 (*)    All other components within normal limits  TROPONIN I   ____________________________________________  EKG  ED ECG REPORT I, Doran Stabler, the attending physician, personally viewed and interpreted this ECG.   Date: 05/10/2018  EKG Time: 1544  Rate: 69  Rhythm: normal sinus rhythm  Axis: Normal  Intervals:none  ST&T Change: No ST segment elevation or depression.  No abnormal T wave inversion.  ____________________________________________  RADIOLOGY   ____________________________________________   PROCEDURES  Procedure(s) performed:   Procedures  Critical Care performed:   ____________________________________________   INITIAL IMPRESSION / ASSESSMENT AND PLAN / ED COURSE  Pertinent labs & imaging results that were available during my care of the patient were reviewed by me and considered in my medical decision making (see chart for details).  DDX: Less light abnormality, arrhythmia, dehydration,  kidney failure, hypotension, dietary changes, hyponatremia, hypothyroid As part of my medical decision making, I reviewed the following data within the [place] Notes from prior ED visits   ----------------------------------------- 5:55 PM on 05/10/2018 -----------------------------------------  Patient received a liter of fluid and is asymptomatic at this time.  Orthostatic vital signs taken and patient did not have orthostatic changes nor was he symptomatic with position change.  Discussed case Dr. Zollie Scale.  Concern was regarding which blood pressure medication to take off because of the patient's recently lower blood pressures.  We will discontinue his HydroDIURIL.  Patient with recent dramatic dietary change but I believe something that would be positive for him over the long-term to reduce his sodium.  He will be following up with his primary care doctor and has an appointment in December with nephrology.  He will be discharged at this time.  Patient will continue to stay hydrated with fluid intake by mouth at home.  He is understanding of the diagnosis as well as treatment plan willing to comply. ____________________________________________   FINAL CLINICAL IMPRESSION(S) / ED DIAGNOSES  Near syncope.  Orthostatic changes.    NEW MEDICATIONS STARTED DURING THIS VISIT:  New Prescriptions   No medications on file     Note:  This document was prepared using Dragon voice recognition software and may include unintentional dictation errors.     Orbie Pyo, MD 05/10/18 [phone]

## 2018-05-14 ENCOUNTER — Encounter: Payer: Self-pay | Admitting: Family Medicine

## 2018-05-14 ENCOUNTER — Ambulatory Visit (INDEPENDENT_AMBULATORY_CARE_PROVIDER_SITE_OTHER): Payer: Medicare Other | Admitting: Family Medicine

## 2018-05-14 VITALS — BP 146/74 | HR 80 | Temp 98.4°F | Ht 67.0 in | Wt 153.4 lb

## 2018-05-14 DIAGNOSIS — N183 Chronic kidney disease, stage 3 unspecified: Secondary | ICD-10-CM

## 2018-05-14 DIAGNOSIS — R42 Dizziness and giddiness: Secondary | ICD-10-CM | POA: Diagnosis not present

## 2018-05-14 DIAGNOSIS — Z23 Encounter for immunization: Secondary | ICD-10-CM | POA: Diagnosis not present

## 2018-05-14 DIAGNOSIS — I129 Hypertensive chronic kidney disease with stage 1 through stage 4 chronic kidney disease, or unspecified chronic kidney disease: Secondary | ICD-10-CM

## 2018-05-14 NOTE — Patient Instructions (Addendum)
Cut your lisinopril in half (20 mg daily), continue monitoring home blood pressures, call with abnormal readings (120-150/70s-90s)  Influenza (Flu) Vaccine (Inactivated or Recombinant): What You Need to Know 1. Why get vaccinated? Influenza ("flu") is a contagious disease that spreads around the [place] every year, usually between October and May. Flu is caused by influenza viruses, and is spread mainly by coughing, sneezing, and close contact. Anyone can get flu. Flu strikes suddenly and can last several days. Symptoms vary by age, but can include:  fever/chills  sore throat  muscle aches  fatigue  cough  headache  runny or stuffy nose  Flu can also lead to pneumonia and blood infections, and cause diarrhea and seizures in children. If you have a medical condition, such as heart or lung disease, flu can make it worse. Flu is more dangerous for some people. Infants and young children, people 30 years of age and older, pregnant women, and people with certain health conditions or a weakened immune system are at greatest risk. Each year thousands of people in the [place] die from flu, and many more are hospitalized. Flu vaccine can:  keep you from getting flu,  make flu less severe if you do get it, and  keep you from spreading flu to your family and other people. 2. Inactivated and recombinant flu vaccines A dose of flu vaccine is recommended every flu season. Children 6 months through 19 years of age may need two doses during the same flu season. Everyone else needs only one dose each flu season. Some inactivated flu vaccines contain a very small amount of a mercury-based preservative called thimerosal. Studies have not shown thimerosal in vaccines to be harmful, but flu vaccines that do not contain thimerosal are available. There is no live flu virus in flu shots. They cannot cause the flu. There are many flu viruses, and they are always changing. Each year a new flu  vaccine is made to protect against three or four viruses that are likely to cause disease in the upcoming flu season. But even when the vaccine doesn't exactly match these viruses, it may still provide some protection. Flu vaccine cannot prevent:  flu that is caused by a virus not covered by the vaccine, or  illnesses that look like flu but are not.  It takes about 2 weeks for protection to develop after vaccination, and protection lasts through the flu season. 3. Some people should not get this vaccine Tell the person who is giving you the vaccine:  If you have any severe, life-threatening allergies. If you ever had a life-threatening allergic reaction after a dose of flu vaccine, or have a severe allergy to any part of this vaccine, you may be advised not to get vaccinated. Most, but not all, types of flu vaccine contain a small amount of egg protein.  If you ever had Guillain-Barr Syndrome (also called GBS). Some people with a history of GBS should not get this vaccine. This should be discussed with your doctor.  If you are not feeling well. It is usually okay to get flu vaccine when you have a mild illness, but you might be asked to come back when you feel better.  4. Risks of a vaccine reaction With any medicine, including vaccines, there is a chance of reactions. These are usually mild and go away on their own, but serious reactions are also possible. Most people who get a flu shot do not have any problems with it. Minor problems  following a flu shot include:  soreness, redness, or swelling where the shot was given  hoarseness  sore, red or itchy eyes  cough  fever  aches  headache  itching  fatigue  If these problems occur, they usually begin soon after the shot and last 1 or 2 days. More serious problems following a flu shot can include the following:  There may be a small increased risk of Guillain-Barre Syndrome (GBS) after inactivated flu vaccine. This risk has  been estimated at 1 or 2 additional cases per million people vaccinated. This is much lower than the risk of severe complications from flu, which can be prevented by flu vaccine.  Young children who get the flu shot along with pneumococcal vaccine (PCV13) and/or DTaP vaccine at the same time might be slightly more likely to have a seizure caused by fever. Ask your doctor for more information. Tell your doctor if a child who is getting flu vaccine has ever had a seizure.  Problems that could happen after any injected vaccine:  People sometimes faint after a medical procedure, including vaccination. Sitting or lying down for about 15 minutes can help prevent fainting, and injuries caused by a fall. Tell your doctor if you feel dizzy, or have vision changes or ringing in the ears.  Some people get severe pain in the shoulder and have difficulty moving the arm where a shot was given. This happens very rarely.  Any medication can cause a severe allergic reaction. Such reactions from a vaccine are very rare, estimated at about 1 in a million doses, and would happen within a few minutes to a few hours after the vaccination. As with any medicine, there is a very remote chance of a vaccine causing a serious injury or death. The safety of vaccines is always being monitored. For more information, visit: http://www.aguilar.org/ 5. What if there is a serious reaction? What should I look for? Look for anything that concerns you, such as signs of a severe allergic reaction, very high fever, or unusual behavior. Signs of a severe allergic reaction can include hives, swelling of the face and throat, difficulty breathing, a fast heartbeat, dizziness, and weakness. These would start a few minutes to a few hours after the vaccination. What should I do?  If you think it is a severe allergic reaction or other emergency that can't wait, call 9-1-1 and get the person to the nearest hospital. Otherwise, call your  doctor.  Reactions should be reported to the Vaccine Adverse Event Reporting System (VAERS). Your doctor should file this report, or you can do it yourself through the VAERS web site at www.vaers.SamedayNews.es, or by calling (847) 312-2387. ? VAERS does not give medical advice. 6. The National Vaccine Injury Compensation Program The [employer] Vaccine Injury Compensation Program (VICP) is a federal program that was created to compensate people who may have been injured by certain vaccines. Persons who believe they may have been injured by a vaccine can learn about the program and about filing a claim by calling (252)346-8785 or visiting the [place] website at GoldCloset.com.ee. There is a time limit to file a claim for compensation. 7. How can I learn more?  Ask your healthcare provider. He or she can give you the vaccine package insert or suggest other sources of information.  Call your local or state health department.  Contact the Centers for Disease Control and Prevention (CDC): ? Call (919)767-1509 (1-800-CDC-INFO) or ? Visit CDC's website at https://gibson.com/ Vaccine Information Statement, Inactivated Influenza Vaccine (  CDC's website at www.cdc.gov/flu Vaccine Information Statement, Inactivated Influenza Vaccine (02/03/2014) This information is not intended to replace advice given to you by your health care provider. Make sure you discuss any questions you have with your health care provider. Document Released: 04/10/2006 Document Revised: 03/06/2016 Document Reviewed: 03/06/2016 Elsevier Interactive Patient Education  2017 Elsevier Inc.  

## 2018-05-14 NOTE — Assessment & Plan Note (Signed)
Recheck BMP today, await Nephrology consult next month.

## 2018-05-14 NOTE — Assessment & Plan Note (Signed)
BPs still soft at times, cut lisinopril in half to 20 mg dose and continue close home monitoring. Stay well hydrated. Call with persistent abnormals. Await consultation with Nephrology next month. Continue low sodium diet. Await BMP results. Continue d/c of HCTZ

## 2018-05-14 NOTE — Progress Notes (Signed)
BP (!) 146/74   Pulse 80   Temp 98.4 F (36.9 C) (Oral)   Ht 5\' 7"  (1.702 m)   Wt 153 lb 6.4 oz (69.6 kg)   SpO2 98%   BMI 24.03 kg/m    Subjective:    Patient ID: Roger Byrd, male    DOB: [birth year], 77 y.o.   MRN: [id number]  HPI: Roger Byrd is a 77 y.o. male  Chief Complaint  Patient presents with  . Blood Pressure Check    pt states his BP has been up and down the last few days, states he was taken off of HCTZ at the ER and did not take his medications this morning   Here today for ER f/u from orthostatic hypotension/dizziness. Was brought in by EMS about a week ago with a systolic BP of 60. Found to have AKI, resolved with IV hydration. Was told on d/c from ER to stop HCTZ. Has done this and still having occasional dizziness and hypotension with average BP readings between 105-130/60s-low 80s. Seeing Nephrology for initial consult Dec 20th. Has continued his good dietary changes with reduced sodium and started drinking much more water. Feeling better overall. Denies CP, SOB, syncope, falls, urinary sxs.   Past Medical History:  Diagnosis Date  . Anxiety   . Chronic kidney disease   . Hyperlipidemia   . Hypertension   . Insomnia   . Thyroid disease    Social History   Socioeconomic History  . Marital status: Widowed    Spouse name: Not on file  . Number of children: Not on file  . Years of education: 1  . Highest education level: 12th grade  Occupational History  . Not on file  Social Needs  . Financial resource strain: Not hard at all  . Food insecurity:    Worry: Never true    Inability: Never true  . Transportation needs:    Medical: No    Non-medical: No  Tobacco Use  . Smoking status: Former Smoker    Last attempt to quit: 08/08/1970    Years since quitting: 47.7  . Smokeless tobacco: Never Used  Substance and Sexual Activity  . Alcohol use: Yes    Alcohol/week: 14.0 standard drinks    Types: 14 Glasses of wine per week  . Drug use: No    . Sexual activity: Not Currently  Lifestyle  . Physical activity:    Days per week: 0 days    Minutes per session: 0 min  . Stress: Not at all  Relationships  . Social connections:    Talks on phone: More than three times a week    Gets together: More than three times a week    Attends religious service: More than 4 times per year    Active member of club or organization: No    Attends meetings of clubs or organizations: Never    Relationship status: Widowed  . Intimate partner violence:    Fear of current or ex partner: No    Emotionally abused: No    Physically abused: No    Forced sexual activity: No  Other Topics Concern  . Not on file  Social History Narrative  . Not on file    Relevant past medical, surgical, family and social history reviewed and updated as indicated. Interim medical history since our last visit reviewed. Allergies and medications reviewed and updated.  Review of Systems  Per HPI unless specifically indicated above  Objective:    BP (!) 146/74   Pulse 80   Temp 98.4 F (36.9 C) (Oral)   Ht 5\' 7"  (1.702 m)   Wt 153 lb 6.4 oz (69.6 kg)   SpO2 98%   BMI 24.03 kg/m   Wt Readings from Last 3 Encounters:  05/14/18 153 lb 6.4 oz (69.6 kg)  05/10/18 153 lb (69.4 kg)  04/30/18 155 lb (70.3 kg)    Physical Exam  Constitutional: He is oriented to person, place, and time. He appears well-developed and well-nourished. No distress.  HENT:  Head: Atraumatic.  Eyes: Conjunctivae and EOM are normal.  Neck: Normal range of motion. Neck supple.  Cardiovascular: Normal rate, regular rhythm and normal heart sounds.  Pulmonary/Chest: Effort normal and breath sounds normal.  Musculoskeletal: Normal range of motion.  Neurological: He is alert and oriented to person, place, and time. No cranial nerve deficit.  Skin: Skin is warm and dry.  Psychiatric: He has a normal mood and affect. His behavior is normal.  Nursing note and vitals  reviewed.   Results for orders placed or performed during the hospital encounter of 05/10/18  CBC with Differential  Result Value Ref Range   WBC 6.7 4.0 - 10.5 K/uL   RBC 4.66 4.22 - 5.81 MIL/uL   Hemoglobin 12.3 (L) 13.0 - 17.0 g/dL   HCT 38.0 (L) 39.0 - 52.0 %   MCV 81.5 80.0 - 100.0 fL   MCH 26.4 26.0 - 34.0 pg   MCHC 32.4 30.0 - 36.0 g/dL   RDW 13.8 11.5 - 15.5 %   Platelets 306 150 - 400 K/uL   nRBC 0.0 0.0 - 0.2 %   Neutrophils Relative % 63 %   Neutro Abs 4.2 1.7 - 7.7 K/uL   Lymphocytes Relative 25 %   Lymphs Abs 1.7 0.7 - 4.0 K/uL   Monocytes Relative 8 %   Monocytes Absolute 0.5 0.1 - 1.0 K/uL   Eosinophils Relative 3 %   Eosinophils Absolute 0.2 0.0 - 0.5 K/uL   Basophils Relative 0 %   Basophils Absolute 0.0 0.0 - 0.1 K/uL   Immature Granulocytes 1 %   Abs Immature Granulocytes 0.07 0.00 - 0.07 K/uL  Troponin I - ONCE - STAT  Result Value Ref Range   Troponin I <0.03 <0.03 ng/mL  Urinalysis, Complete w Microscopic  Result Value Ref Range   Color, Urine YELLOW (A) YELLOW   APPearance CLEAR (A) CLEAR   Specific Gravity, Urine 1.017 1.005 - 1.030   pH 5.0 5.0 - 8.0   Glucose, UA 150 (A) NEGATIVE mg/dL   Hgb urine dipstick NEGATIVE NEGATIVE   Bilirubin Urine NEGATIVE NEGATIVE   Ketones, ur NEGATIVE NEGATIVE mg/dL   Protein, ur NEGATIVE NEGATIVE mg/dL   Nitrite NEGATIVE NEGATIVE   Leukocytes, UA NEGATIVE NEGATIVE   RBC / HPF 0-5 0 - 5 RBC/hpf   WBC, UA 0-5 0 - 5 WBC/hpf   Bacteria, UA NONE SEEN NONE SEEN   Squamous Epithelial / LPF 0-5 0 - 5   Mucus PRESENT    Hyaline Casts, UA PRESENT   Comprehensive metabolic panel  Result Value Ref Range   Sodium 134 (L) 135 - 145 mmol/L   Potassium 3.9 3.5 - 5.1 mmol/L   Chloride 102 98 - 111 mmol/L   CO2 23 22 - 32 mmol/L   Glucose, Bld 161 (H) 70 - 99 mg/dL   BUN 37 (H) 8 - 23 mg/dL   Creatinine, Ser 1.74 (H) 0.61 -  1.24 mg/dL   Calcium 8.2 (L) 8.9 - 10.3 mg/dL   Total Protein 6.9 6.5 - 8.1 g/dL   Albumin 4.0  3.5 - 5.0 g/dL   AST 18 15 - 41 U/L   ALT 16 0 - 44 U/L   Alkaline Phosphatase 62 38 - 126 U/L   Total Bilirubin 0.6 0.3 - 1.2 mg/dL   GFR calc non Af Amer 36 (L) >60 mL/min   GFR calc Af Amer 42 (L) >60 mL/min   Anion gap 9 5 - 15      Assessment & Plan:   Problem List Items Addressed This Visit      Genitourinary   Hypertensive CKD (chronic kidney disease)    BPs still soft at times, cut lisinopril in half to 20 mg dose and continue close home monitoring. Stay well hydrated. Call with persistent abnormals. Await consultation with Nephrology next month. Continue low sodium diet. Await BMP results. Continue d/c of HCTZ      CKD (chronic kidney disease)    Recheck BMP today, await Nephrology consult next month.       Relevant Orders   Basic Metabolic Panel (BMET)    Other Visit Diagnoses    Dizziness    -  Primary   Improved some with better hydration and d/c of HCTZ. Will further decrease BP regimen and continue to monitor.    Need for influenza vaccination       Relevant Orders   Flu vaccine HIGH DOSE PF (Completed)       Follow up plan: Return in about 6 months (around 11/12/2018) for BP, chol f/u.

## 2018-05-15 LAB — BASIC METABOLIC PANEL
BUN/Creatinine Ratio: 18 (ref 10–24)
BUN: 23 mg/dL (ref 8–27)
CO2: 20 mmol/L (ref 20–29)
CREATININE: 1.3 mg/dL — AB (ref 0.76–1.27)
Calcium: 9.2 mg/dL (ref 8.6–10.2)
Chloride: 101 mmol/L (ref 96–106)
GFR calc non Af Amer: 53 mL/min/{1.73_m2} — ABNORMAL LOW (ref >59)
GFR, EST AFRICAN AMERICAN: 61 mL/min/{1.73_m2} (ref >59)
GLUCOSE: 121 mg/dL — AB (ref 65–99)
Potassium: 4.2 mmol/L (ref 3.5–5.2)
SODIUM: 137 mmol/L (ref 134–144)

## 2018-05-31 ENCOUNTER — Ambulatory Visit: Payer: Medicare Other

## 2018-06-18 DIAGNOSIS — R809 Proteinuria, unspecified: Secondary | ICD-10-CM | POA: Diagnosis not present

## 2018-06-18 DIAGNOSIS — I10 Essential (primary) hypertension: Secondary | ICD-10-CM | POA: Diagnosis not present

## 2018-06-18 DIAGNOSIS — D631 Anemia in chronic kidney disease: Secondary | ICD-10-CM | POA: Diagnosis not present

## 2018-06-18 DIAGNOSIS — N183 Chronic kidney disease, stage 3 (moderate): Secondary | ICD-10-CM | POA: Diagnosis not present

## 2018-06-21 ENCOUNTER — Other Ambulatory Visit: Payer: Self-pay | Admitting: Nephrology

## 2018-06-21 DIAGNOSIS — N183 Chronic kidney disease, stage 3 unspecified: Secondary | ICD-10-CM

## 2018-06-28 ENCOUNTER — Ambulatory Visit
Admission: RE | Admit: 2018-06-28 | Discharge: 2018-06-28 | Disposition: A | Discharge status: Home / Self Care | Payer: Medicare Other | Source: Ambulatory Visit | Attending: Nephrology | Admitting: Nephrology

## 2018-06-28 DIAGNOSIS — N183 Chronic kidney disease, stage 3 unspecified: Secondary | ICD-10-CM

## 2018-07-09 DIAGNOSIS — H11003 Unspecified pterygium of eye, bilateral: Secondary | ICD-10-CM | POA: Diagnosis not present

## 2018-07-09 LAB — HM DIABETES EYE EXAM

## 2018-07-21 ENCOUNTER — Other Ambulatory Visit: Payer: Self-pay | Admitting: Unknown Physician Specialty

## 2018-07-21 NOTE — Telephone Encounter (Signed)
Requested Prescriptions  Pending Prescriptions Disp Refills  . amLODipine (NORVASC) 5 MG tablet [Pharmacy Med Name: AMLODIPINE BESYLATE 5 MG TAB] 90 tablet 1    Sig: TAKE 1 TABLET BY MOUTH DAILY     Cardiovascular:  Calcium Channel Blockers Failed - 07/21/2018  9:58 AM      Failed - Last BP in normal range    BP Readings from Last 1 Encounters:  05/14/18 (!) 146/74         Passed - Valid encounter within last 6 months    Recent Outpatient Visits          2 months ago Dizziness   [facility] Merrie Roof [place], [place]   2 months ago Essential hypertension   [facility] Kathrine Haddock, NP   4 months ago Pre-operative clearance   [place], [place], PA-C   5 months ago Impingement syndrome of left shoulder   [place], PA-C   8 months ago Bilateral carpal tunnel syndrome   [facility] Kathrine Haddock, NP

## 2018-07-23 ENCOUNTER — Other Ambulatory Visit: Payer: Self-pay

## 2018-07-23 DIAGNOSIS — R809 Proteinuria, unspecified: Secondary | ICD-10-CM | POA: Diagnosis not present

## 2018-07-23 DIAGNOSIS — I10 Essential (primary) hypertension: Secondary | ICD-10-CM | POA: Diagnosis not present

## 2018-07-23 DIAGNOSIS — N183 Chronic kidney disease, stage 3 (moderate): Secondary | ICD-10-CM | POA: Diagnosis not present

## 2018-07-23 DIAGNOSIS — D631 Anemia in chronic kidney disease: Secondary | ICD-10-CM | POA: Diagnosis not present

## 2018-07-23 NOTE — Patient Outreach (Signed)
[facility]) Care Management  07/23/2018  Roger Byrd 1940-09-19 [id number]   Medication Adherence call to Roger Byrd spoke with patient he is due on Lisinopril 40 mg patient is only taking 1/2 tablet and wants to finished what he has and he will ask doctor to give him a new prescription for Lisinopril 20 mg for next time. Roger Byrd is showing past due under [place].   [place] Management Direct Dial [phone]  Fax [phone] [email or web address]

## 2018-08-02 ENCOUNTER — Telehealth: Payer: Self-pay | Admitting: Family Medicine

## 2018-08-02 MED ORDER — LISINOPRIL 20 MG PO TABS: 20.0000 mg | ORAL_TABLET | Freq: Every day | ORAL | 0 refills | Status: DC

## 2018-08-02 NOTE — Telephone Encounter (Signed)
Called and spoke with pt. [name]'s message relayed. He statad he already picked up the Rx. Pt verbalized understanding.

## 2018-08-02 NOTE — Telephone Encounter (Signed)
-----   Message from Stark Klein sent at 08/02/2018 11:50 AM EST ----- Rx for lisinopril 40 wasn't received by pharmacy, pharmacy called to have it resent.

## 2018-08-02 NOTE — Telephone Encounter (Signed)
Looking back at my note, looks like we discussed decreasing to 20 mg dose - new dose sent over for him. He should follow up if still having dizziness or if BPs are staying high

## 2018-08-10 ENCOUNTER — Inpatient Hospital Stay: Payer: Medicare Other | Attending: Internal Medicine | Admitting: Internal Medicine

## 2018-08-10 ENCOUNTER — Encounter: Payer: Self-pay | Admitting: Internal Medicine

## 2018-08-10 DIAGNOSIS — E611 Iron deficiency: Secondary | ICD-10-CM

## 2018-08-10 DIAGNOSIS — Z7982 Long term (current) use of aspirin: Secondary | ICD-10-CM | POA: Insufficient documentation

## 2018-08-10 DIAGNOSIS — Z79899 Other long term (current) drug therapy: Secondary | ICD-10-CM

## 2018-08-10 DIAGNOSIS — Z87891 Personal history of nicotine dependence: Secondary | ICD-10-CM | POA: Insufficient documentation

## 2018-08-10 DIAGNOSIS — N182 Chronic kidney disease, stage 2 (mild): Secondary | ICD-10-CM | POA: Diagnosis not present

## 2018-08-10 DIAGNOSIS — D631 Anemia in chronic kidney disease: Secondary | ICD-10-CM | POA: Insufficient documentation

## 2018-08-10 NOTE — Progress Notes (Signed)
[place] NOTE  Patient Care Team: Venita Lick, NP as PCP - General (Nurse Practitioner)  CHIEF COMPLAINTS/PURPOSE OF CONSULTATION:  Anemia  # ANEMIA/ CKD-II; last colo- 3 years [Dr.Elliot]; awaiting re-eval.   # CKD- stage II-III [Dr.Kolluru]   No history exists.     HISTORY OF PRESENTING ILLNESS:  Roger Byrd 78 y.o.  male with a history of mild CKD stage II-III and also iron deficient anemia has been referred to [place] for further evaluation recommendations.  Patient was recently evaluated by nephrology Dr. Zollie Scale for CKD.  After adjustment of his antihypertensives patient's creatinine is holding steady around 1.2-1.3.  Hemoglobin 11.9 iron studies suggestive of mild iron deficiency.  Patient was held on p.o. iron poor tolerance with constipation.  He has been referred to GI awaiting GI evaluation.  Patient denies any blood in stools or black or stools.  Denies any nausea vomiting or weight loss.  He denies any significant fatigue.  Denies any shortness of breath or cough.  Review of Systems  Constitutional: Negative for chills, diaphoresis, fever, malaise/fatigue and weight loss.  HENT: Negative for nosebleeds and sore throat.   Eyes: Negative for double vision.  Respiratory: Negative for cough, hemoptysis, sputum production, shortness of breath and wheezing.   Cardiovascular: Negative for chest pain, palpitations, orthopnea and leg swelling.  Gastrointestinal: Negative for abdominal pain, blood in stool, constipation, diarrhea, heartburn, melena, nausea and vomiting.  Genitourinary: Negative for dysuria, frequency and urgency.  Musculoskeletal: Negative for back pain and joint pain.  Skin: Negative.  Negative for itching and rash.  Neurological: Negative for dizziness, tingling, focal weakness, weakness and headaches.  Endo/Heme/Allergies: Does not bruise/bleed easily.  Psychiatric/Behavioral: Negative for depression. The patient is not  nervous/anxious and does not have insomnia.      MEDICAL HISTORY:  Past Medical History:  Diagnosis Date  . Anxiety   . Chronic kidney disease   . Hyperlipidemia   . Hypertension   . Insomnia   . Thyroid disease     SURGICAL HISTORY: Past Surgical History:  Procedure Laterality Date  . BACK SURGERY     x2  . CARPAL TUNNEL RELEASE Right 03/08/2018   Procedure: CARPAL TUNNEL RELEASE;  Surgeon: Earnestine Leys, MD;  Location: [facility] ORS;  Service: Orthopedics;  Laterality: Right;  . COLONOSCOPY WITH PROPOFOL N/A 07/19/2015   Procedure: COLONOSCOPY WITH PROPOFOL;  Surgeon: Manya Silvas, MD;  Location: [facility] ENDOSCOPY;  Service: Endoscopy;  Laterality: N/A;    SOCIAL HISTORY: Social History   Socioeconomic History  . Marital status: Widowed    Spouse name: Not on file  . Number of children: Not on file  . Years of education: 31  . Highest education level: 12th grade  Occupational History  . Not on file  Social Needs  . Financial resource strain: Not hard at all  . Food insecurity:    Worry: Never true    Inability: Never true  . Transportation needs:    Medical: No    Non-medical: No  Tobacco Use  . Smoking status: Former Smoker    Last attempt to quit: 08/08/1970    Years since quitting: 48.0  . Smokeless tobacco: Never Used  Substance and Sexual Activity  . Alcohol use: Yes    Alcohol/week: 14.0 standard drinks    Types: 14 Glasses of wine per week  . Drug use: No  . Sexual activity: Not Currently  Lifestyle  . Physical activity:    Days per week:  0 days    Minutes per session: 0 min  . Stress: Not at all  Relationships  . Social connections:    Talks on phone: More than three times a week    Gets together: More than three times a week    Attends religious service: More than 4 times per year    Active member of club or organization: No    Attends meetings of clubs or organizations: Never    Relationship status: Widowed  . Intimate partner violence:    Fear  of current or ex partner: No    Emotionally abused: No    Physically abused: No    Forced sexual activity: No  Other Topics Concern  . Not on file  Social History Narrative  . Not on file    FAMILY HISTORY: Family History  Problem Relation Age of Onset  . Hypertension Mother   . Heart disease Mother        heart attack  . Hypertension Sister   . Emphysema Father   . Hypertension Daughter   . Hypertension Son   . Hypertension Sister   . Hypertension Sister   . Emphysema Sister     ALLERGIES:  has No Known Allergies.  MEDICATIONS:  Current Outpatient Medications  Medication Sig Dispense Refill  . amLODipine (NORVASC) 5 MG tablet TAKE 1 TABLET BY MOUTH DAILY 90 tablet 1  . aspirin 81 MG tablet Take 81 mg by mouth every evening.     Marland Kitchen atorvastatin (LIPITOR) 40 MG tablet Take 1 tablet (40 mg total) by mouth daily at 6 PM. 90 tablet 1  . lisinopril (PRINIVIL,ZESTRIL) 20 MG tablet Take 1 tablet (20 mg total) by mouth daily. 90 tablet 0   No current facility-administered medications for this visit.       Marland Kitchen  PHYSICAL EXAMINATION: ECOG PERFORMANCE STATUS: 0 - Asymptomatic  Vitals:   08/10/18 1536  BP: (!) 150/71  Pulse: 71  Resp: 16  Temp: 98.2 F (36.8 C)   Filed Weights   08/10/18 1536  Weight: 155 lb (70.3 kg)    Physical Exam  Constitutional: He is oriented to person, place, and time and well-developed, well-nourished, and in no distress.  HENT:  Head: Normocephalic and atraumatic.  Mouth/Throat: Oropharynx is clear and moist. No oropharyngeal exudate.  Eyes: Pupils are equal, round, and reactive to light.  Neck: Normal range of motion. Neck supple.  Cardiovascular: Normal rate and regular rhythm.  Pulmonary/Chest: No respiratory distress. He has no wheezes.  Abdominal: Soft. Bowel sounds are normal. He exhibits no distension and no mass. There is no abdominal tenderness. There is no rebound and no guarding.  Musculoskeletal: Normal range of motion.         General: No tenderness or edema.  Neurological: He is alert and oriented to person, place, and time.  Skin: Skin is warm.  Psychiatric: Affect normal.     LABORATORY DATA:  I have reviewed the data as listed Lab Results  Component Value Date   WBC 6.7 05/10/2018   HGB 12.3 (L) 05/10/2018   HCT 38.0 (L) 05/10/2018   MCV 81.5 05/10/2018   PLT 306 05/10/2018   Recent Labs    02/26/18 1539 04/30/18 1601 05/10/18 1540 05/14/18 1135  NA 133* 137 134* 137  K 4.5 4.2 3.9 4.2  CL 95* 98 102 101  CO2 22 21 23 20   GLUCOSE 92 131* 161* 121*  BUN 34* 33* 37* 23  CREATININE 1.44* 1.64* 1.74* 1.30*  CALCIUM 9.2 9.6 8.2* 9.2  GFRNONAA 47* 40* 36* 53*  GFRAA 54* 46* 42* 61  PROT 6.0 6.7 6.9  --   ALBUMIN 4.0 4.6 4.0  --   AST 15 15 18   --   ALT 18 15 16   --   ALKPHOS 70 82 62  --   BILITOT 0.4 0.3 0.6  --     RADIOGRAPHIC STUDIES: I have personally reviewed the radiological images as listed and agreed with the findings in the report. No results found.  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  Hemoglobin 11.9; iron saturation 14%.  Patient is fairly asymptomatic.  #Recommend a trial of p.o. iron 1 every other day/3 times a week to avoid constipation.  And also discussed at length regarding iron rich foods including but not limited to green leafy vegetables/raisin/dates and chicken liver.  #Etiology of anemia-question CKD versus GI loss.  Patient awaiting reevaluation with GI Dr. Percell [place] office.  # DISPOSITION:  # No labs today. # Follow up in 3 months-MD/labs- cbc/bmp/iron studies/ferritin- Dr.B  # 45 minutes face-to-face with the patient discussing the above plan of care; more than 50% of time spent on prognosis/ natural history; counseling and coordination.   Thank you Dr.Lateef for allowing me to participate in the care of your pleasant patient. Please do not hesitate to contact me with questions or  concerns in the interim.   All questions were answered. The patient knows to call the clinic with any problems, questions or concerns.    Cammie Sickle, MD 08/10/2018 4:26 PM

## 2018-08-10 NOTE — Assessment & Plan Note (Addendum)
#  Iron deficiency secondary to possible CKD.  Hemoglobin 11.9; iron saturation 14%.  Patient is fairly asymptomatic.  #Recommend a trial of p.o. iron 1 every other day/3 times a week to avoid constipation.  And also discussed at length regarding iron rich foods including but not limited to green leafy vegetables/raisin/dates and chicken liver.  #Etiology of anemia-question CKD versus GI loss.  Patient awaiting reevaluation with GI Dr. Percell [place] office.  # DISPOSITION:  # No labs today. # Follow up in 3 months-MD/labs- cbc/bmp/iron studies/ferritin- Dr.B  # 45 minutes face-to-face with the patient discussing the above plan of care; more than 50% of time spent on prognosis/ natural history; counseling and coordination.   Thank you Dr.Lateef for allowing me to participate in the care of your pleasant patient. Please do not hesitate to contact me with questions or concerns in the interim.

## 2018-08-31 ENCOUNTER — Encounter: Payer: Self-pay | Admitting: Nephrology

## 2018-09-03 ENCOUNTER — Telehealth: Payer: Self-pay | Admitting: Nurse Practitioner

## 2018-09-03 NOTE — Telephone Encounter (Unsigned)
Copied from [place] [phone]. Topic: Medicare AWV >> Sep 03, 2018 12:28 PM Sherren Kerns wrote: Called to schedule Medicare Annual Wellness Visit with the Nurse Health Advisor. No answer at Home/Mobile number and voicemail box has not been set up yet.  If patient returns call, please note: their last AWV was on 05/28/2017, please schedule AWV-s with [facility] any date AFTER 05/28/2018.  Thank you! For any questions please contact: Janace Hoard at [phone] or Skype [email or web address]

## 2018-09-08 DIAGNOSIS — N183 Chronic kidney disease, stage 3 (moderate): Secondary | ICD-10-CM | POA: Diagnosis not present

## 2018-09-08 DIAGNOSIS — Z86010 Personal history of colonic polyps: Secondary | ICD-10-CM | POA: Diagnosis not present

## 2018-09-08 DIAGNOSIS — D509 Iron deficiency anemia, unspecified: Secondary | ICD-10-CM | POA: Diagnosis not present

## 2018-09-08 DIAGNOSIS — D508 Other iron deficiency anemias: Secondary | ICD-10-CM | POA: Diagnosis not present

## 2018-09-27 DIAGNOSIS — R809 Proteinuria, unspecified: Secondary | ICD-10-CM | POA: Diagnosis not present

## 2018-09-27 DIAGNOSIS — D631 Anemia in chronic kidney disease: Secondary | ICD-10-CM | POA: Diagnosis not present

## 2018-09-27 DIAGNOSIS — N183 Chronic kidney disease, stage 3 (moderate): Secondary | ICD-10-CM | POA: Diagnosis not present

## 2018-09-27 DIAGNOSIS — I10 Essential (primary) hypertension: Secondary | ICD-10-CM | POA: Diagnosis not present

## 2018-11-01 ENCOUNTER — Other Ambulatory Visit: Payer: Self-pay | Admitting: Family Medicine

## 2018-11-01 NOTE — Telephone Encounter (Signed)
Refill request for lisinopril 20 mg tab. LR 08/02/18. Per chart, returned to office for 6 month check for b/p, and chol.  Notes from the last appointment.   LOV 05/14/2018 with Merrie Roof

## 2018-11-02 ENCOUNTER — Telehealth: Payer: Self-pay | Admitting: *Deleted

## 2018-11-02 NOTE — Telephone Encounter (Signed)
Contacted the patient regarding the need for a telehealth apt on 5/12. Patient agreeable to phone call only. He understand to keep the lab apts.  He requested that the lab apt be changed to 1145 am.next week. I adjusted the schedule accordingly.

## 2018-11-06 ENCOUNTER — Other Ambulatory Visit: Payer: Self-pay | Admitting: Unknown Physician Specialty

## 2018-11-08 ENCOUNTER — Other Ambulatory Visit: Payer: Self-pay

## 2018-11-08 ENCOUNTER — Ambulatory Visit: Admit: 2018-11-08 | Payer: Medicare Other | Admitting: Unknown Physician Specialty

## 2018-11-08 SURGERY — COLONOSCOPY WITH PROPOFOL
Anesthesia: General

## 2018-11-09 ENCOUNTER — Inpatient Hospital Stay: Payer: Medicare Other | Attending: Internal Medicine | Admitting: *Deleted

## 2018-11-09 ENCOUNTER — Encounter: Payer: Self-pay | Admitting: Internal Medicine

## 2018-11-09 ENCOUNTER — Inpatient Hospital Stay (HOSPITAL_BASED_OUTPATIENT_CLINIC_OR_DEPARTMENT_OTHER): Payer: Medicare Other | Admitting: Internal Medicine

## 2018-11-09 VITALS — BP 131/64 | HR 64 | Temp 97.1°F | Resp 20

## 2018-11-09 DIAGNOSIS — D631 Anemia in chronic kidney disease: Secondary | ICD-10-CM

## 2018-11-09 DIAGNOSIS — N182 Chronic kidney disease, stage 2 (mild): Secondary | ICD-10-CM

## 2018-11-09 DIAGNOSIS — D649 Anemia, unspecified: Secondary | ICD-10-CM | POA: Insufficient documentation

## 2018-11-09 LAB — IRON AND TIBC
Iron: 53 ug/dL (ref 45–182)
Saturation Ratios: 17 % — ABNORMAL LOW (ref 17.9–39.5)
TIBC: 317 ug/dL (ref 250–450)
UIBC: 264 ug/dL

## 2018-11-09 LAB — CBC WITH DIFFERENTIAL/PLATELET
Abs Immature Granulocytes: 0 10*3/uL (ref 0.00–0.07)
Basophils Absolute: 0 10*3/uL (ref 0.0–0.1)
Basophils Relative: 1 %
Eosinophils Absolute: 0.2 10*3/uL (ref 0.0–0.5)
Eosinophils Relative: 5 %
HCT: 36 % — ABNORMAL LOW (ref 39.0–52.0)
Hemoglobin: 11.7 g/dL — ABNORMAL LOW (ref 13.0–17.0)
Immature Granulocytes: 0 %
Lymphocytes Relative: 35 %
Lymphs Abs: 1.3 10*3/uL (ref 0.7–4.0)
MCH: 26.6 pg (ref 26.0–34.0)
MCHC: 32.5 g/dL (ref 30.0–36.0)
MCV: 81.8 fL (ref 80.0–100.0)
Monocytes Absolute: 0.5 10*3/uL (ref 0.1–1.0)
Monocytes Relative: 13 %
Neutro Abs: 1.8 10*3/uL (ref 1.7–7.7)
Neutrophils Relative %: 46 %
Platelets: 238 10*3/uL (ref 150–400)
RBC: 4.4 MIL/uL (ref 4.22–5.81)
RDW: 15 % (ref 11.5–15.5)
WBC: 3.8 10*3/uL — ABNORMAL LOW (ref 4.0–10.5)
nRBC: 0 % (ref 0.0–0.2)

## 2018-11-09 LAB — BASIC METABOLIC PANEL
Anion gap: 8 (ref 5–15)
BUN: 29 mg/dL — ABNORMAL HIGH (ref 8–23)
CO2: 22 mmol/L (ref 22–32)
Calcium: 8.7 mg/dL — ABNORMAL LOW (ref 8.9–10.3)
Chloride: 108 mmol/L (ref 98–111)
Creatinine, Ser: 1.48 mg/dL — ABNORMAL HIGH (ref 0.61–1.24)
GFR calc Af Amer: 52 mL/min — ABNORMAL LOW (ref >60)
GFR calc non Af Amer: 45 mL/min — ABNORMAL LOW (ref >60)
Glucose, Bld: 131 mg/dL — ABNORMAL HIGH (ref 70–99)
Potassium: 4 mmol/L (ref 3.5–5.1)
Sodium: 138 mmol/L (ref 135–145)

## 2018-11-09 LAB — FERRITIN: Ferritin: 37 ng/mL (ref 24–336)

## 2018-11-09 NOTE — Progress Notes (Signed)
I connected with Roger Byrd on 11/09/18 at  2:15 PM EDT by telephone visit and verified that I am speaking with the correct person using two identifiers.  I discussed the limitations, risks, security and privacy concerns of performing an evaluation and management service by telemedicine and the availability of in-person appointments. I also discussed with the patient that there may be a patient responsible charge related to this service. The patient expressed understanding and agreed to proceed.    Other persons participating in the visit and their role in the encounter: none  Patient's location: Home Provider's location: Office   No history exists.     Chief Complaint: Anemia   History of present illness:Roger Byrd 78 y.o.  male with history of chronic kidney disease/mild anemia is here for follow-up.  Patient denies any blood in stools or black-colored stools.  He has not been taking iron pills because of constipation.   Observation/objective: Hemoglobin 11.9.  Iron saturation 17%.  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  Hemoglobin 11.7; iron saturation 14%.  Fairly asymptomatic.  #I would recommend a trial of iron pill every other day-given history of constipation.  Continue dietary supplementation.  #Etiology of anemia-question CKD versus GI loss.  GI work-up is on hold because of COVID pandemic.  # DISPOSITION:  # Follow up in 3 months-MD/labs- cbc/bmp/MM panel/K-light chain- Dr.B     Follow-up instructions:  I discussed the assessment and treatment plan with the patient.  The patient was provided an opportunity to ask questions and all were answered.  The patient agreed with the plan and demonstrated understanding of instructions.  The patient was advised to call back or seek an in person evaluation if the symptoms worsen or if the condition fails to improve as anticipated.  I provided 12   minutes of non face-to-face telephone visit time during this encounter, and > 50% was spent counseling as documented under my assessment & plan.   Dr. Charlaine Dalton [facility] at [facility] 11/09/2018 2:33 PM

## 2018-11-09 NOTE — Assessment & Plan Note (Addendum)
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  Hemoglobin 11.7; iron saturation 14%.  Fairly asymptomatic.  #I would recommend a trial of iron pill every other day-given history of constipation.  Continue dietary supplementation.  #Etiology of anemia-question CKD versus GI loss.  GI work-up is on hold because of COVID pandemic.  # DISPOSITION:  # Follow up in 3 months-MD/labs- cbc/bmp/MM panel/K-light chain- Dr.B

## 2018-12-10 ENCOUNTER — Telehealth: Payer: Self-pay | Admitting: Nurse Practitioner

## 2018-12-10 NOTE — Chronic Care Management (AMB) (Signed)
Chronic Care Management   Note  12/10/2018 Name: Roger Byrd MRN: [id number] DOB: [birth year]  [name] is a 78 y.o. year old male who is a primary care patient of Cannady, Barbaraann Faster, NP. I reached out to [name] by phone today in response to a referral sent by Mr. Delyle Weider [employer]'s health plan.    Mr. Record was given information about Chronic Care Management services today including:  1. CCM service includes personalized support from designated clinical staff supervised by his physician, including individualized plan of care and coordination with other care providers 2. 24/7 contact phone numbers for assistance for urgent and routine care needs. 3. Service will only be billed when office clinical staff spend 20 minutes or more in a month to coordinate care. 4. Only one practitioner may furnish and bill the service in a calendar month. 5. The patient may stop CCM services at any time (effective at the end of the month) by phone call to the office staff. 6. The patient will be responsible for cost sharing (co-pay) of up to 20% of the service fee (after annual deductible is met).  Patient did not agree to enrollment in care management services and does not wish to consider at this time.  Follow up plan: The patient has been provided with contact information for the chronic care management team and has been advised to call with any health related questions or concerns.   [place]  [email or web address]   ??[phone]

## 2019-01-11 ENCOUNTER — Other Ambulatory Visit: Payer: Self-pay | Admitting: Nurse Practitioner

## 2019-01-24 DIAGNOSIS — R809 Proteinuria, unspecified: Secondary | ICD-10-CM | POA: Diagnosis not present

## 2019-01-24 DIAGNOSIS — I10 Essential (primary) hypertension: Secondary | ICD-10-CM | POA: Diagnosis not present

## 2019-01-24 DIAGNOSIS — N183 Chronic kidney disease, stage 3 (moderate): Secondary | ICD-10-CM | POA: Diagnosis not present

## 2019-01-24 DIAGNOSIS — D631 Anemia in chronic kidney disease: Secondary | ICD-10-CM | POA: Diagnosis not present

## 2019-01-25 ENCOUNTER — Other Ambulatory Visit: Payer: Self-pay | Admitting: Nurse Practitioner

## 2019-01-27 ENCOUNTER — Other Ambulatory Visit: Payer: Self-pay

## 2019-01-27 ENCOUNTER — Encounter: Payer: Self-pay | Admitting: Family Medicine

## 2019-01-27 ENCOUNTER — Ambulatory Visit (INDEPENDENT_AMBULATORY_CARE_PROVIDER_SITE_OTHER): Payer: Medicare Other | Admitting: Family Medicine

## 2019-01-27 VITALS — BP 136/70 | HR 65 | Temp 97.8°F | Ht 68.0 in | Wt 156.0 lb

## 2019-01-27 DIAGNOSIS — R42 Dizziness and giddiness: Secondary | ICD-10-CM

## 2019-01-27 DIAGNOSIS — I129 Hypertensive chronic kidney disease with stage 1 through stage 4 chronic kidney disease, or unspecified chronic kidney disease: Secondary | ICD-10-CM

## 2019-01-27 DIAGNOSIS — E782 Mixed hyperlipidemia: Secondary | ICD-10-CM

## 2019-01-27 DIAGNOSIS — N183 Chronic kidney disease, stage 3 unspecified: Secondary | ICD-10-CM

## 2019-01-27 MED ORDER — AMLODIPINE BESYLATE 5 MG PO TABS: 5.0000 mg | ORAL_TABLET | Freq: Every day | ORAL | 1 refills | Status: DC

## 2019-01-27 NOTE — Assessment & Plan Note (Signed)
BPs stable and WNL,continue current regimen and following with Nephrology

## 2019-01-27 NOTE — Assessment & Plan Note (Signed)
Recheck lipids, adjust lipitor if needed. Continue good lifestyle habits

## 2019-01-27 NOTE — Progress Notes (Signed)
BP 136/70   Pulse 65   Temp 97.8 F (36.6 C) (Oral)   Ht 5\' 8"  (1.727 m)   Wt 156 lb (70.8 kg)   SpO2 100%   BMI 23.72 kg/m    Subjective:    Patient ID: Roger Byrd, male    DOB: 01/30/1941, 78 y.o.   MRN: [id number]  HPI: Roger Byrd is a 78 y.o. male  Chief Complaint  Patient presents with  . Dizziness    Vertigo. Ongoing since 3 days. Has taken dramamine with no relief.    Here today with 3 days of intermittent mild dizziness that seems to occur when he changes position quickly. Has had this happen to him before in the past and states dramamine helped so got some the other day. Has not noticed that it helps much but also isn't very bothered by the sxs. Denies syncope, SOB, visual disturbances, HAs, gait issues, recent illnesses.   BPs have been in the 130s/60s when checked. Taking his medications faithfully without side effects. Denies CP, SOB, HAs, syncope. Staying active and trying to eat well.   Working with Nephrologist for decreased renal function, getting labs frequently with them and having to go back here soon for repeat labs. Drinking tons of water per their recommendation.   Relevant past medical, surgical, family and social history reviewed and updated as indicated. Interim medical history since our last visit reviewed. Allergies and medications reviewed and updated.  Review of Systems  Per HPI unless specifically indicated above     Objective:    BP 136/70   Pulse 65   Temp 97.8 F (36.6 C) (Oral)   Ht 5\' 8"  (1.727 m)   Wt 156 lb (70.8 kg)   SpO2 100%   BMI 23.72 kg/m   Wt Readings from Last 3 Encounters:  01/27/19 156 lb (70.8 kg)  08/10/18 155 lb (70.3 kg)  05/14/18 153 lb 6.4 oz (69.6 kg)    Physical Exam Vitals signs and nursing note reviewed.  Constitutional:      Appearance: Normal appearance.  HENT:     Head: Atraumatic.  Eyes:     Extraocular Movements: Extraocular movements intact.     Conjunctiva/sclera: Conjunctivae  normal.  Neck:     Musculoskeletal: Normal range of motion and neck supple.  Cardiovascular:     Rate and Rhythm: Normal rate and regular rhythm.  Pulmonary:     Effort: Pulmonary effort is normal.     Breath sounds: Normal breath sounds.  Musculoskeletal: Normal range of motion.  Skin:    General: Skin is warm and dry.  Neurological:     General: No focal deficit present.     Mental Status: He is oriented to person, place, and time.  Psychiatric:        Mood and Affect: Mood normal.        Thought Content: Thought content normal.        Judgment: Judgment normal.     Results for orders placed or performed in visit on 11/24/18  HM DIABETES EYE EXAM  Result Value Ref Range   HM Diabetic Eye Exam No Retinopathy No Retinopathy      Assessment & Plan:   Problem List Items Addressed This Visit      Genitourinary   Hypertensive CKD (chronic kidney disease)    BPs stable and WNL,continue current regimen and following with Nephrology        Other   Hyperlipidemia - Primary  Recheck lipids, adjust lipitor if needed. Continue good lifestyle habits      Relevant Medications   amLODipine (NORVASC) 5 MG tablet   Other Relevant Orders   Lipid Panel w/o Chol/HDL Ratio   Comprehensive metabolic panel    Other Visit Diagnoses    Dizziness       Negative orthostatic vital signs, exam benign, BPs seem stable. Not quite consistent with vertigo either but can continue dramamine prn. F/u if not resolving       Follow up plan: Return in about 6 months (around 07/30/2019) for CPE with PCP, AWV with Tiffany.

## 2019-01-28 LAB — COMPREHENSIVE METABOLIC PANEL
ALT: 17 IU/L (ref 0–44)
AST: 20 IU/L (ref 0–40)
Albumin/Globulin Ratio: 1.8 (ref 1.2–2.2)
Albumin: 4.6 g/dL (ref 3.7–4.7)
Alkaline Phosphatase: 62 IU/L (ref 39–117)
BUN/Creatinine Ratio: 16 (ref 10–24)
BUN: 19 mg/dL (ref 8–27)
Bilirubin Total: 0.4 mg/dL (ref 0.0–1.2)
CO2: 20 mmol/L (ref 20–29)
Calcium: 9.3 mg/dL (ref 8.6–10.2)
Chloride: 100 mmol/L (ref 96–106)
Creatinine, Ser: 1.18 mg/dL (ref 0.76–1.27)
GFR calc Af Amer: 68 mL/min/{1.73_m2} (ref >59)
GFR calc non Af Amer: 59 mL/min/{1.73_m2} — ABNORMAL LOW (ref >59)
Globulin, Total: 2.6 g/dL (ref 1.5–4.5)
Glucose: 96 mg/dL (ref 65–99)
Potassium: 4.5 mmol/L (ref 3.5–5.2)
Sodium: 139 mmol/L (ref 134–144)
Total Protein: 7.2 g/dL (ref 6.0–8.5)

## 2019-01-28 LAB — LIPID PANEL W/O CHOL/HDL RATIO
Cholesterol, Total: 168 mg/dL (ref 100–199)
HDL: 50 mg/dL (ref >39)
LDL Calculated: 83 mg/dL (ref 0–99)
Triglycerides: 177 mg/dL — ABNORMAL HIGH (ref 0–149)
VLDL Cholesterol Cal: 35 mg/dL (ref 5–40)

## 2019-02-03 DIAGNOSIS — N183 Chronic kidney disease, stage 3 (moderate): Secondary | ICD-10-CM | POA: Diagnosis not present

## 2019-02-07 ENCOUNTER — Other Ambulatory Visit: Payer: Self-pay | Admitting: *Deleted

## 2019-02-07 DIAGNOSIS — N182 Chronic kidney disease, stage 2 (mild): Secondary | ICD-10-CM

## 2019-02-07 DIAGNOSIS — D631 Anemia in chronic kidney disease: Secondary | ICD-10-CM

## 2019-02-08 ENCOUNTER — Other Ambulatory Visit: Payer: Self-pay

## 2019-02-08 ENCOUNTER — Inpatient Hospital Stay: Payer: Medicare Other | Attending: Internal Medicine

## 2019-02-08 ENCOUNTER — Other Ambulatory Visit: Payer: Self-pay | Admitting: Unknown Physician Specialty

## 2019-02-08 ENCOUNTER — Inpatient Hospital Stay (HOSPITAL_BASED_OUTPATIENT_CLINIC_OR_DEPARTMENT_OTHER): Payer: Medicare Other | Admitting: Internal Medicine

## 2019-02-08 DIAGNOSIS — E785 Hyperlipidemia, unspecified: Secondary | ICD-10-CM | POA: Insufficient documentation

## 2019-02-08 DIAGNOSIS — D631 Anemia in chronic kidney disease: Secondary | ICD-10-CM

## 2019-02-08 DIAGNOSIS — D509 Iron deficiency anemia, unspecified: Secondary | ICD-10-CM | POA: Diagnosis not present

## 2019-02-08 DIAGNOSIS — Z87891 Personal history of nicotine dependence: Secondary | ICD-10-CM | POA: Insufficient documentation

## 2019-02-08 DIAGNOSIS — Z79899 Other long term (current) drug therapy: Secondary | ICD-10-CM | POA: Diagnosis not present

## 2019-02-08 DIAGNOSIS — N182 Chronic kidney disease, stage 2 (mild): Secondary | ICD-10-CM

## 2019-02-08 DIAGNOSIS — I129 Hypertensive chronic kidney disease with stage 1 through stage 4 chronic kidney disease, or unspecified chronic kidney disease: Secondary | ICD-10-CM | POA: Insufficient documentation

## 2019-02-08 DIAGNOSIS — E079 Disorder of thyroid, unspecified: Secondary | ICD-10-CM | POA: Insufficient documentation

## 2019-02-08 DIAGNOSIS — G4700 Insomnia, unspecified: Secondary | ICD-10-CM | POA: Insufficient documentation

## 2019-02-08 DIAGNOSIS — Z7982 Long term (current) use of aspirin: Secondary | ICD-10-CM | POA: Insufficient documentation

## 2019-02-08 DIAGNOSIS — N183 Chronic kidney disease, stage 3 (moderate): Secondary | ICD-10-CM | POA: Diagnosis not present

## 2019-02-08 DIAGNOSIS — F419 Anxiety disorder, unspecified: Secondary | ICD-10-CM | POA: Diagnosis not present

## 2019-02-08 LAB — CBC WITH DIFFERENTIAL/PLATELET
Abs Immature Granulocytes: 0.03 10*3/uL (ref 0.00–0.07)
Basophils Absolute: 0.1 10*3/uL (ref 0.0–0.1)
Basophils Relative: 1 %
Eosinophils Absolute: 0.2 10*3/uL (ref 0.0–0.5)
Eosinophils Relative: 3 %
HCT: 41 % (ref 39.0–52.0)
Hemoglobin: 13.5 g/dL (ref 13.0–17.0)
Immature Granulocytes: 1 %
Lymphocytes Relative: 31 %
Lymphs Abs: 1.8 10*3/uL (ref 0.7–4.0)
MCH: 26.9 pg (ref 26.0–34.0)
MCHC: 32.9 g/dL (ref 30.0–36.0)
MCV: 81.8 fL (ref 80.0–100.0)
Monocytes Absolute: 0.4 10*3/uL (ref 0.1–1.0)
Monocytes Relative: 7 %
Neutro Abs: 3.2 10*3/uL (ref 1.7–7.7)
Neutrophils Relative %: 57 %
Platelets: 309 10*3/uL (ref 150–400)
RBC: 5.01 MIL/uL (ref 4.22–5.81)
RDW: 13.1 % (ref 11.5–15.5)
WBC: 5.7 10*3/uL (ref 4.0–10.5)
nRBC: 0 % (ref 0.0–0.2)

## 2019-02-08 LAB — BASIC METABOLIC PANEL
Anion gap: 10 (ref 5–15)
BUN: 28 mg/dL — ABNORMAL HIGH (ref 8–23)
CO2: 26 mmol/L (ref 22–32)
Calcium: 9.3 mg/dL (ref 8.9–10.3)
Chloride: 103 mmol/L (ref 98–111)
Creatinine, Ser: 1.35 mg/dL — ABNORMAL HIGH (ref 0.61–1.24)
GFR calc Af Amer: 58 mL/min — ABNORMAL LOW (ref >60)
GFR calc non Af Amer: 50 mL/min — ABNORMAL LOW (ref >60)
Glucose, Bld: 135 mg/dL — ABNORMAL HIGH (ref 70–99)
Potassium: 4.3 mmol/L (ref 3.5–5.1)
Sodium: 139 mmol/L (ref 135–145)

## 2019-02-08 NOTE — Telephone Encounter (Signed)
Requested Prescriptions  Pending Prescriptions Disp Refills  . atorvastatin (LIPITOR) 40 MG tablet [Pharmacy Med Name: ATORVASTATIN CALCIUM 40 MG TAB] 90 tablet 1    Sig: TAKE 1 TABLET BY MOUTH DAILY AT 6 PM.     Cardiovascular:  Antilipid - Statins Failed - 02/08/2019  9:17 AM      Failed - Triglycerides in normal range and within 360 days    Triglycerides  Date Value Ref Range Status  01/27/2019 177 (H) 0 - 149 mg/dL Final   Triglycerides Piccolo,Waived  Date Value Ref Range Status  05/01/2015 111 <150 mg/dL Final    Comment:                            Normal                   <150                         Borderline High     150 - 199                         High                200 - 499                         Very High                >499          Passed - Total Cholesterol in normal range and within 360 days    Cholesterol, Total  Date Value Ref Range Status  01/27/2019 168 100 - 199 mg/dL Final   Cholesterol Piccolo, Waived  Date Value Ref Range Status  05/01/2015 185 <200 mg/dL Final    Comment:                            Desirable                <200                         Borderline High      200- 239                         High                     >239          Passed - LDL in normal range and within 360 days    LDL Calculated  Date Value Ref Range Status  01/27/2019 83 0 - 99 mg/dL Final         Passed - HDL in normal range and within 360 days    HDL  Date Value Ref Range Status  01/27/2019 50 >39 mg/dL Final         Passed - Patient is not pregnant      Passed - Valid encounter within last 12 months    Recent Outpatient Visits          1 week ago Mixed hyperlipidemia   [facility] [place], [place]   9 months ago [place], [name], [place]  9 months ago Essential hypertension   [facility] Kathrine Haddock, NP   11 months ago Pre-operative clearance   [place], [place]   11 months ago Impingement syndrome of left shoulder   [facility] Trinna Post, [place]      Future Appointments            In 5 months Orene Desanctis, Lilia Argue, PA-C [facility], [place]

## 2019-02-08 NOTE — Progress Notes (Signed)
[place] NOTE  Patient Care Team: Venita Lick, NP as PCP - General (Nurse Practitioner)  CHIEF COMPLAINTS/PURPOSE OF CONSULTATION:  Anemia  # ANEMIA/ CKD-II; last colo- 3 years [Dr.Elliot]; awaiting re-eval.   # CKD- stage II-III [Dr.Kolluru]  Oncology History   No history exists.   HISTORY OF PRESENTING ILLNESS:   Roger Byrd 78 y.o.  male with a history of mild CKD stage II-III and also iron deficient anemia-is here for follow-up.  Patient is currently on p.o. iron every other day.  He denies any blood in stools or black or stools.  GI work-up is on hold sec to covid-19.   Otherwise no nausea vomiting abdominal pain.  His energy is adequate.  He continues to play golf  Review of Systems  Constitutional: Negative for chills, diaphoresis, fever, malaise/fatigue and weight loss.  HENT: Negative for nosebleeds and sore throat.   Eyes: Negative for double vision.  Respiratory: Negative for cough, hemoptysis, sputum production, shortness of breath and wheezing.   Cardiovascular: Negative for chest pain, palpitations, orthopnea and leg swelling.  Gastrointestinal: Negative for abdominal pain, blood in stool, constipation, diarrhea, heartburn, melena, nausea and vomiting.  Genitourinary: Negative for dysuria, frequency and urgency.  Musculoskeletal: Negative for back pain and joint pain.  Skin: Negative.  Negative for itching and rash.  Neurological: Negative for dizziness, tingling, focal weakness, weakness and headaches.  Endo/Heme/Allergies: Does not bruise/bleed easily.  Psychiatric/Behavioral: Negative for depression. The patient is not nervous/anxious and does not have insomnia.      MEDICAL HISTORY:  Past Medical History:  Diagnosis Date  . Anxiety   . Chronic kidney disease   . Hyperlipidemia   . Hypertension   . Insomnia   . Thyroid disease     SURGICAL HISTORY: Past Surgical History:  Procedure Laterality Date  . BACK SURGERY      x2  . CARPAL TUNNEL RELEASE Right 03/08/2018   Procedure: CARPAL TUNNEL RELEASE;  Surgeon: Earnestine Leys, MD;  Location: [facility] ORS;  Service: Orthopedics;  Laterality: Right;  . COLONOSCOPY WITH PROPOFOL N/A 07/19/2015   Procedure: COLONOSCOPY WITH PROPOFOL;  Surgeon: Manya Silvas, MD;  Location: [facility] ENDOSCOPY;  Service: Endoscopy;  Laterality: N/A;    SOCIAL HISTORY: Social History   Socioeconomic History  . Marital status: Widowed    Spouse name: Not on file  . Number of children: Not on file  . Years of education: 94  . Highest education level: 12th grade  Occupational History  . Not on file  Social Needs  . Financial resource strain: Not hard at all  . Food insecurity    Worry: Never true    Inability: Never true  . Transportation needs    Medical: No    Non-medical: No  Tobacco Use  . Smoking status: Former Smoker    Quit date: 08/08/1970    Years since quitting: 48.5  . Smokeless tobacco: Never Used  Substance and Sexual Activity  . Alcohol use: Yes    Alcohol/week: 14.0 standard drinks    Types: 14 Glasses of wine per week  . Drug use: No  . Sexual activity: Not Currently  Lifestyle  . Physical activity    Days per week: 0 days    Minutes per session: 0 min  . Stress: Not at all  Relationships  . Social connections    Talks on phone: More than three times a week    Gets together: More than three times a week  Attends religious service: More than 4 times per year    Active member of club or organization: No    Attends meetings of clubs or organizations: Never    Relationship status: Widowed  . Intimate partner violence    Fear of current or ex partner: No    Emotionally abused: No    Physically abused: No    Forced sexual activity: No  Other Topics Concern  . Not on file  Social History Narrative  . Not on file    FAMILY HISTORY: Family History  Problem Relation Age of Onset  . Hypertension Mother   . Heart disease Mother        heart  attack  . Hypertension Sister   . Emphysema Father   . Hypertension Daughter   . Hypertension Son   . Hypertension Sister   . Hypertension Sister   . Emphysema Sister     ALLERGIES:  has No Known Allergies.  MEDICATIONS:  Current Outpatient Medications  Medication Sig Dispense Refill  . amLODipine (NORVASC) 5 MG tablet Take 1 tablet (5 mg total) by mouth daily. 90 tablet 1  . aspirin 81 MG tablet Take 81 mg by mouth every evening.     Marland Kitchen atorvastatin (LIPITOR) 40 MG tablet TAKE 1 TABLET BY MOUTH DAILY AT 6 PM. 90 tablet 1  . ferrous sulfate 325 (65 FE) MG tablet Take 325 mg by mouth daily with breakfast.    . lisinopril (ZESTRIL) 20 MG tablet TAKE 1 TABLET BY MOUTH DAILY 90 tablet 0   No current facility-administered medications for this visit.       Marland Kitchen  PHYSICAL EXAMINATION: ECOG PERFORMANCE STATUS: 0 - Asymptomatic  Vitals:   02/08/19 1400  BP: (!) 144/80  Pulse: 69  Resp: 20  Temp: (!) 96.4 F (35.8 C)   Filed Weights   02/08/19 1359  Weight: 153 lb (69.4 kg)    Physical Exam  Constitutional: He is oriented to person, place, and time and well-developed, well-nourished, and in no distress.  HENT:  Head: Normocephalic and atraumatic.  Mouth/Throat: Oropharynx is clear and moist. No oropharyngeal exudate.  Eyes: Pupils are equal, round, and reactive to light.  Neck: Normal range of motion. Neck supple.  Cardiovascular: Normal rate and regular rhythm.  Pulmonary/Chest: No respiratory distress. He has no wheezes.  Abdominal: Soft. Bowel sounds are normal. He exhibits no distension and no mass. There is no abdominal tenderness. There is no rebound and no guarding.  Musculoskeletal: Normal range of motion.        General: No tenderness or edema.  Neurological: He is alert and oriented to person, place, and time.  Skin: Skin is warm.  Psychiatric: Affect normal.     LABORATORY DATA:  I have reviewed the data as listed Lab Results  Component Value Date   WBC  5.7 02/08/2019   HGB 13.5 02/08/2019   HCT 41.0 02/08/2019   MCV 81.8 02/08/2019   PLT 309 02/08/2019   Recent Labs    04/30/18 1601  05/10/18 1540  11/09/18 1151 01/27/19 1624 02/08/19 1339  NA 137   < > 134*   < > 138 139 139  K 4.2  --  3.9   < > 4.0 4.5 4.3  CL 98  --  102   < > 108 100 103  CO2 21  --  23   < > 22 20 26   GLUCOSE 131*   < > 161*   < > 131* 96  135*  BUN 33*   < > 37*   < > 29* 19 28*  CREATININE 1.64*  --  1.74*   < > 1.48* 1.18 1.35*  CALCIUM 9.6  --  8.2*   < > 8.7* 9.3 9.3  GFRNONAA 40*  --  36*   < > 45* 59* 50*  GFRAA 46*  --  42*   < > 52* 68 58*  PROT 6.7  --  6.9  --   --  7.2  --   ALBUMIN 4.6  --  4.0  --   --  4.6  --   AST 15  --  18  --   --  20  --   ALT 15  --  16  --   --  17  --   ALKPHOS 82  --  62  --   --  62  --   BILITOT 0.3  --  0.6  --   --  0.4  --    < > = values in this interval not displayed.    RADIOGRAPHIC STUDIES: I have personally reviewed the radiological images as listed and agreed with the findings in the report. No results found.  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  Hemoglobin 13.5; on PO iron every other day.   # CKD stage III- STABLE.   # DISPOSITION:  # Follow up in 6 months-MD/labs- cbc/bmp-/iron studies/ferritin- Dr.B  All questions were answered. The patient knows to call the clinic with any problems, questions or concerns.    Cammie Sickle, MD 02/08/2019 2:32 PM

## 2019-02-08 NOTE — Assessment & Plan Note (Addendum)
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  Hemoglobin 13.5; on PO iron every other day.   # CKD stage III- STABLE.   # DISPOSITION:  # Follow up in 6 months-MD/labs- cbc/bmp-/iron studies/ferritin- Dr.B

## 2019-02-09 LAB — MULTIPLE MYELOMA PANEL, SERUM
Albumin SerPl Elph-Mcnc: 3.9 g/dL (ref 2.9–4.4)
Albumin/Glob SerPl: 1.4 (ref 0.7–1.7)
Alpha 1: 0.2 g/dL (ref 0.0–0.4)
Alpha2 Glob SerPl Elph-Mcnc: 0.8 g/dL (ref 0.4–1.0)
B-Globulin SerPl Elph-Mcnc: 1 g/dL (ref 0.7–1.3)
Gamma Glob SerPl Elph-Mcnc: 0.8 g/dL (ref 0.4–1.8)
Globulin, Total: 2.8 g/dL (ref 2.2–3.9)
IgA: 142 mg/dL (ref 61–437)
IgG (Immunoglobin G), Serum: 779 mg/dL (ref 603–1613)
IgM (Immunoglobulin M), Srm: 65 mg/dL (ref 15–143)
Total Protein ELP: 6.7 g/dL (ref 6.0–8.5)

## 2019-02-09 LAB — KAPPA/LAMBDA LIGHT CHAINS
Kappa free light chain: 29.3 mg/L — ABNORMAL HIGH (ref 3.3–19.4)
Kappa, lambda light chain ratio: 1.85 — ABNORMAL HIGH (ref 0.26–1.65)
Lambda free light chains: 15.8 mg/L (ref 5.7–26.3)

## 2019-02-14 ENCOUNTER — Telehealth: Payer: Self-pay | Admitting: Nurse Practitioner

## 2019-02-14 NOTE — Telephone Encounter (Signed)
Pt has been having an on going issue with his back and wanted to speak with Roger Byrd or her assistant to see if they could call in some Prednisone to help with relief /please advise

## 2019-02-14 NOTE — Telephone Encounter (Signed)
Called patient, busy signal, will try again.

## 2019-02-14 NOTE — Telephone Encounter (Signed)
Roger Byrd is out of the office this week. Please schedule appointment (virtual OK) and we can get him sorted. Thanks!

## 2019-02-16 ENCOUNTER — Encounter: Payer: Self-pay | Admitting: Nurse Practitioner

## 2019-02-16 ENCOUNTER — Other Ambulatory Visit: Payer: Self-pay

## 2019-02-16 ENCOUNTER — Ambulatory Visit (INDEPENDENT_AMBULATORY_CARE_PROVIDER_SITE_OTHER): Payer: Medicare Other | Admitting: Nurse Practitioner

## 2019-02-16 DIAGNOSIS — M545 Low back pain, unspecified: Secondary | ICD-10-CM | POA: Insufficient documentation

## 2019-02-16 MED ORDER — PREDNISONE 10 MG PO TABS: ORAL_TABLET | ORAL | 0 refills | Status: DC

## 2019-02-16 NOTE — Progress Notes (Signed)
BP 137/61   Pulse 70   Temp (!) 96.6 F (35.9 C) (Oral)   Ht 5' 8"  (1.727 m)   Wt 153 lb (69.4 kg)   BMI 23.26 kg/m    Subjective:    Patient ID: Roger Byrd, male    DOB: May 29, 1941, 78 y.o.   MRN: [id number]  HPI: Roger Byrd is a 78 y.o. male  Chief Complaint  Patient presents with  . Back Pain    lower back since last Sunday. tried heat and cold packs. patient states that pain has happened before and went away with Rx prednisone     . This visit was completed via telephone due to the restrictions of the COVID-19 pandemic. All issues as above were discussed and addressed but no physical exam was performed. If it was felt that the patient should be evaluated in the office, they were directed there. The patient verbally consented to this visit. Patient was unable to complete an audio/visual visit due to Technical difficulties,Lack of internet. Due to the catastrophic nature of the COVID-19 pandemic, this visit was done through audio contact only. . Location of the patient: home . Location of the provider: work . Those involved with this call:  . Provider: Marnee Guarneri, DNP . CMA: Lesle Chris, [place] . Front Desk/Registration: Jill Side  . Time spent on call: 15 minutes on the phone discussing health concerns. 10 minutes total spent in review of patient's record and preparation of their chart.  . I verified patient identity using two factors (patient name and date of birth). Patient consents verbally to being seen via telemedicine visit today.    BACK PAIN Had surgery in 1972 and reports every now and then it flares up.  Has been using ice and heat + Tylenol as needed, but not helping 100%.  About 1 1/2 year ago he states he was given Prednisone, which was beneficial and helped with flare. Duration: days Mechanism of injury: unknown, gardening Location: low back Onset: gradual Severity: 5/10 Quality: dull and aching Frequency: intermittent Radiation: none  Aggravating factors: movement Alleviating factors: ice and heat Status: fluctuating Treatments attempted: ice and heat  Relief with NSAIDs?: No NSAIDs Taken Nighttime pain:  no Paresthesias / decreased sensation:  no Bowel / bladder incontinence:  no Fevers:  no Dysuria / urinary frequency:  no  Relevant past medical, surgical, family and social history reviewed and updated as indicated. Interim medical history since our last visit reviewed. Allergies and medications reviewed and updated.  Review of Systems  Constitutional: Negative for activity change, diaphoresis, fatigue and fever.  Respiratory: Negative for cough, chest tightness, shortness of breath and wheezing.   Cardiovascular: Negative for chest pain, palpitations and leg swelling.  Gastrointestinal: Negative for abdominal distention, abdominal pain, constipation, diarrhea, nausea and vomiting.  Musculoskeletal: Positive for back pain.  Neurological: Negative for dizziness, syncope, weakness, light-headedness, numbness and headaches.    Per HPI unless specifically indicated above     Objective:    BP 137/61   Pulse 70   Temp (!) 96.6 F (35.9 C) (Oral)   Ht 5' 8"  (1.727 m)   Wt 153 lb (69.4 kg)   BMI 23.26 kg/m   Wt Readings from Last 3 Encounters:  02/16/19 153 lb (69.4 kg)  02/08/19 153 lb (69.4 kg)  01/27/19 156 lb (70.8 kg)    Physical Exam   Unable to perform due to telephone visit only.  Results for orders placed or performed in visit on  02/08/19  Kappa/lambda light chains  Result Value Ref Range   Kappa free light chain 29.3 (H) 3.3 - 19.4 mg/L   Lamda free light chains 15.8 5.7 - 26.3 mg/L   Kappa, lamda light chain ratio 1.85 (H) 0.26 - 1.65  Multiple Myeloma Panel (SPEP&IFE w/QIG)  Result Value Ref Range   IgG (Immunoglobin G), Serum 779 603 - 1,613 mg/dL   IgA 142 61 - 437 mg/dL   IgM (Immunoglobulin M), Srm 65 15 - 143 mg/dL   Total Protein ELP 6.7 6.0 - 8.5 g/dL   Albumin SerPl Elph-Mcnc  3.9 2.9 - 4.4 g/dL   Alpha 1 0.2 0.0 - 0.4 g/dL   Alpha2 Glob SerPl Elph-Mcnc 0.8 0.4 - 1.0 g/dL   B-Globulin SerPl Elph-Mcnc 1.0 0.7 - 1.3 g/dL   Gamma Glob SerPl Elph-Mcnc 0.8 0.4 - 1.8 g/dL   M Protein SerPl Elph-Mcnc Not Observed Not Observed g/dL   Globulin, Total 2.8 2.2 - 3.9 g/dL   Albumin/Glob SerPl 1.4 0.7 - 1.7   IFE 1 Comment    Please Note Comment   Basic metabolic panel  Result Value Ref Range   Sodium 139 135 - 145 mmol/L   Potassium 4.3 3.5 - 5.1 mmol/L   Chloride 103 98 - 111 mmol/L   CO2 26 22 - 32 mmol/L   Glucose, Bld 135 (H) 70 - 99 mg/dL   BUN 28 (H) 8 - 23 mg/dL   Creatinine, Ser 1.35 (H) 0.61 - 1.24 mg/dL   Calcium 9.3 8.9 - 10.3 mg/dL   GFR calc non Af Amer 50 (L) >60 mL/min   GFR calc Af Amer 58 (L) >60 mL/min   Anion gap 10 5 - 15  CBC with Differential  Result Value Ref Range   WBC 5.7 4.0 - 10.5 K/uL   RBC 5.01 4.22 - 5.81 MIL/uL   Hemoglobin 13.5 13.0 - 17.0 g/dL   HCT 41.0 39.0 - 52.0 %   MCV 81.8 80.0 - 100.0 fL   MCH 26.9 26.0 - 34.0 pg   MCHC 32.9 30.0 - 36.0 g/dL   RDW 13.1 11.5 - 15.5 %   Platelets 309 150 - 400 K/uL   nRBC 0.0 0.0 - 0.2 %   Neutrophils Relative % 57 %   Neutro Abs 3.2 1.7 - 7.7 K/uL   Lymphocytes Relative 31 %   Lymphs Abs 1.8 0.7 - 4.0 K/uL   Monocytes Relative 7 %   Monocytes Absolute 0.4 0.1 - 1.0 K/uL   Eosinophils Relative 3 %   Eosinophils Absolute 0.2 0.0 - 0.5 K/uL   Basophils Relative 1 %   Basophils Absolute 0.1 0.0 - 0.1 K/uL   Immature Granulocytes 1 %   Abs Immature Granulocytes 0.03 0.00 - 0.07 K/uL      Assessment & Plan:   Problem List Items Addressed This Visit      Other   Low back pain    Acute, will send in script for Prednisone taper.  Recommend continued use of heat/ice + Tylenol as needed.  If worsening pain or continued pain then to return to office for face to face visit.      Relevant Medications   predniSONE (DELTASONE) 10 MG tablet      I discussed the assessment and  treatment plan with the patient. The patient was provided an opportunity to ask questions and all were answered. The patient agreed with the plan and demonstrated an understanding of the instructions.   The  patient was advised to call back or seek an in-person evaluation if the symptoms worsen or if the condition fails to improve as anticipated.   I provided 15 minutes of time during this encounter.  Follow up plan: Return if symptoms worsen or fail to improve.

## 2019-02-16 NOTE — Assessment & Plan Note (Signed)
Acute, will send in script for Prednisone taper.  Recommend continued use of heat/ice + Tylenol as needed.  If worsening pain or continued pain then to return to office for face to face visit.

## 2019-02-16 NOTE — Patient Instructions (Signed)
Acute Back Pain, Adult Acute back pain is sudden and usually short-lived. It is often caused by an injury to the muscles and tissues in the back. The injury may result from:  A muscle or ligament getting overstretched or torn (strained). Ligaments are tissues that connect bones to each other. Lifting something improperly can cause a back strain.  Wear and tear (degeneration) of the spinal disks. Spinal disks are circular tissue that provides cushioning between the bones of the spine (vertebrae).  Twisting motions, such as while playing sports or doing yard work.  A hit to the back.  Arthritis. You may have a physical exam, lab tests, and imaging tests to find the cause of your pain. Acute back pain usually goes away with rest and home care. Follow these instructions at home: Managing pain, stiffness, and swelling  Take over-the-counter and prescription medicines only as told by your health care provider.  Your health care provider may recommend applying ice during the first 24-48 hours after your pain starts. To do this: ? Put ice in a plastic bag. ? Place a towel between your skin and the bag. ? Leave the ice on for 20 minutes, 2-3 times a day.  If directed, apply heat to the affected area as often as told by your health care provider. Use the heat source that your health care provider recommends, such as a moist heat pack or a heating pad. ? Place a towel between your skin and the heat source. ? Leave the heat on for 20-30 minutes. ? Remove the heat if your skin turns bright red. This is especially important if you are unable to feel pain, heat, or cold. You have a greater risk of getting burned. Activity   Do not stay in bed. Staying in bed for more than 1-2 days can delay your recovery.  Sit up and stand up straight. Avoid leaning forward when you sit, or hunching over when you stand. ? If you work at a desk, sit close to it so you do not need to lean over. Keep your chin tucked  in. Keep your neck drawn back, and keep your elbows bent at a right angle. Your arms should look like the letter "L." ? Sit high and close to the steering wheel when you drive. Add lower back (lumbar) support to your car seat, if needed.  Take short walks on even surfaces as soon as you are able. Try to increase the length of time you walk each day.  Do not sit, drive, or stand in one place for more than 30 minutes at a time. Sitting or standing for long periods of time can put stress on your back.  Do not drive or use heavy machinery while taking prescription pain medicine.  Use proper lifting techniques. When you bend and lift, use positions that put less stress on your back: ? Bend your knees. ? Keep the load close to your body. ? Avoid twisting.  Exercise regularly as told by your health care provider. Exercising helps your back heal faster and helps prevent back injuries by keeping muscles strong and flexible.  Work with a physical therapist to make a safe exercise program, as recommended by your health care provider. Do any exercises as told by your physical therapist. Lifestyle  Maintain a healthy weight. Extra weight puts stress on your back and makes it difficult to have good posture.  Avoid activities or situations that make you feel anxious or stressed. Stress and anxiety increase muscle   tension and can make back pain worse. Learn ways to manage anxiety and stress, such as through exercise. General instructions  Sleep on a firm mattress in a comfortable position. Try lying on your side with your knees slightly bent. If you lie on your back, put a pillow under your knees.  Follow your treatment plan as told by your health care provider. This may include: ? Cognitive or behavioral therapy. ? Acupuncture or massage therapy. ? Meditation or yoga. Contact a health care provider if:  You have pain that is not relieved with rest or medicine.  You have increasing pain going down  into your legs or buttocks.  Your pain does not improve after 2 weeks.  You have pain at night.  You lose weight without trying.  You have a fever or chills. Get help right away if:  You develop new bowel or bladder control problems.  You have unusual weakness or numbness in your arms or legs.  You develop nausea or vomiting.  You develop abdominal pain.  You feel faint. Summary  Acute back pain is sudden and usually short-lived.  Use proper lifting techniques. When you bend and lift, use positions that put less stress on your back.  Take over-the-counter and prescription medicines and apply heat or ice as directed by your health care provider. This information is not intended to replace advice given to you by your health care provider. Make sure you discuss any questions you have with your health care provider. Document Released: 06/16/2005 Document Revised: 10/05/2018 Document Reviewed: 01/28/2017 Elsevier Patient Education  2020 [employer]  

## 2019-02-21 NOTE — Telephone Encounter (Signed)
It looks like patient was able to be seen last week by [facility]

## 2019-03-22 IMAGING — US US RENAL
1 series · 14 of 25 positions shown · non-contrast
Comparison: Abdominal ultrasound March 01, 2014

CLINICAL DATA: Chronic kidney disease stage 3

EXAM:
RENAL / URINARY TRACT ULTRASOUND COMPLETE

[Series 1: us renal · 0.25mm/px · 14 of 37 slices shown]
[im 1/37]
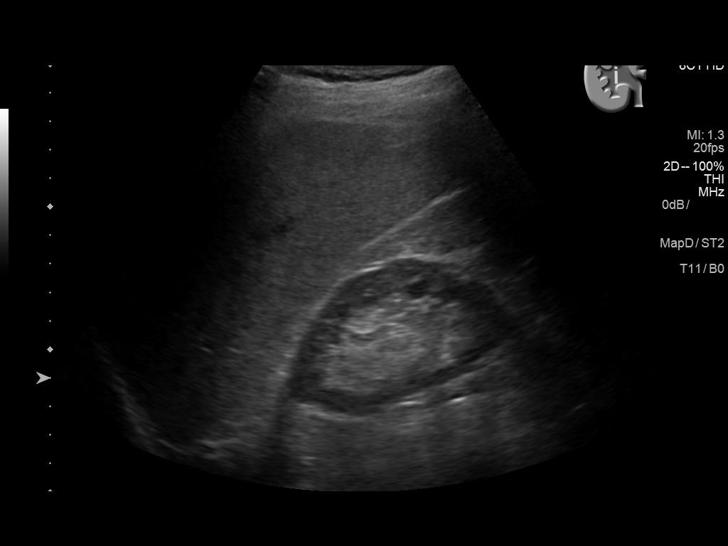
[im 4/37]
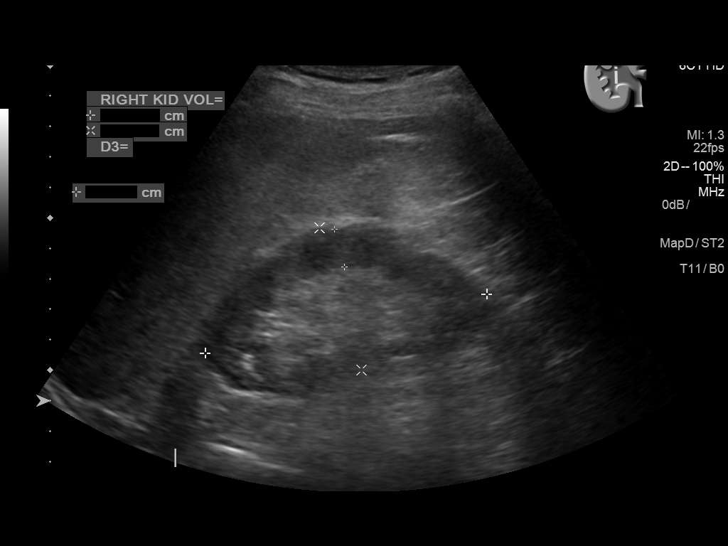
[im 7/37]
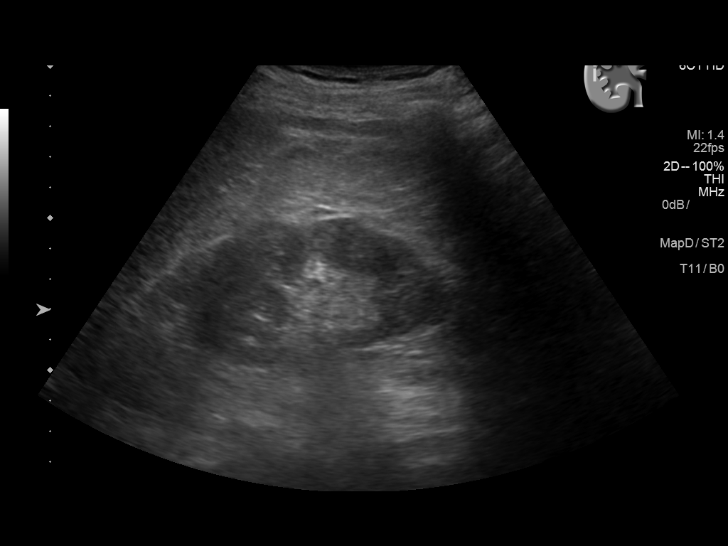
[im 10/37]
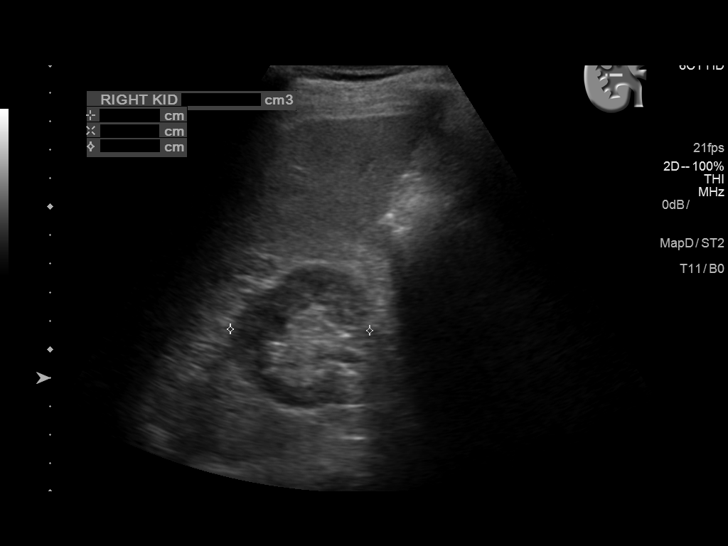
[im 13/37]
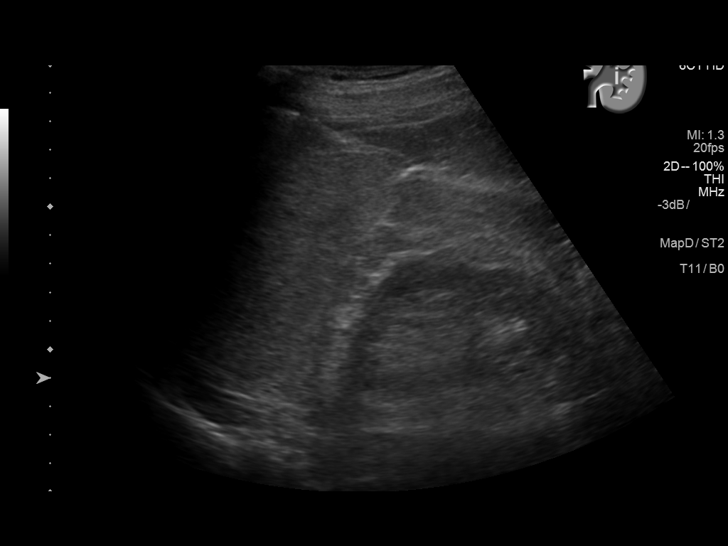
[im 14/37]
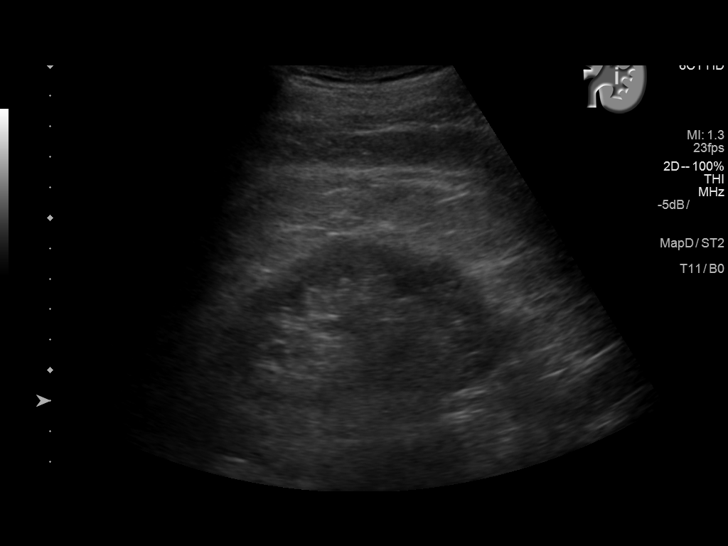
[im 17/37]
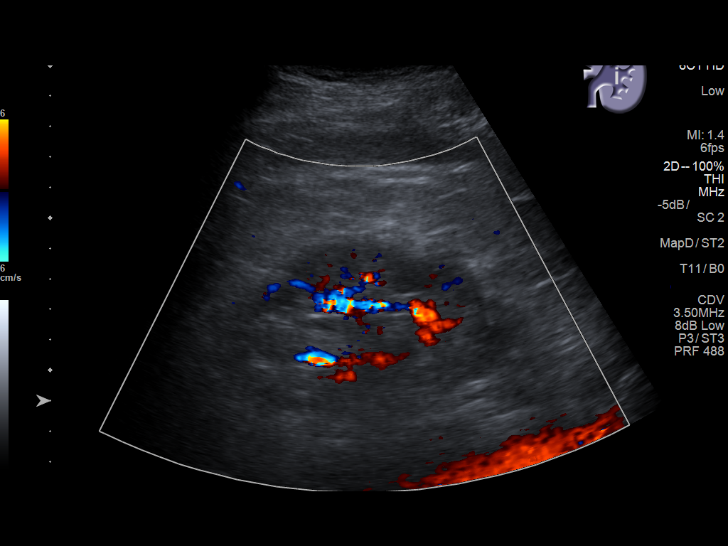
[im 20/37]
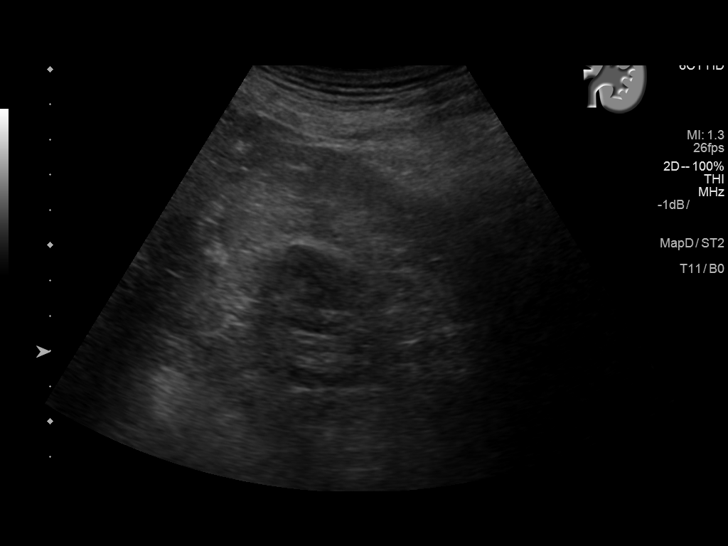
[im 23/37]
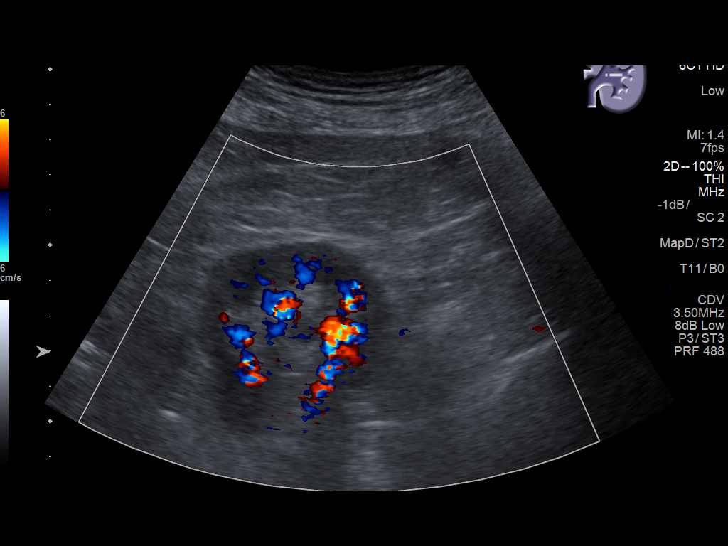
[im 25/37]
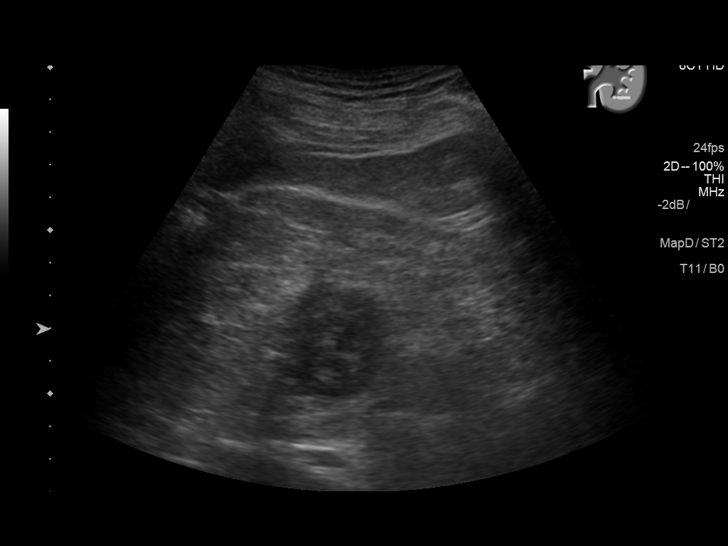
[im 28/37]
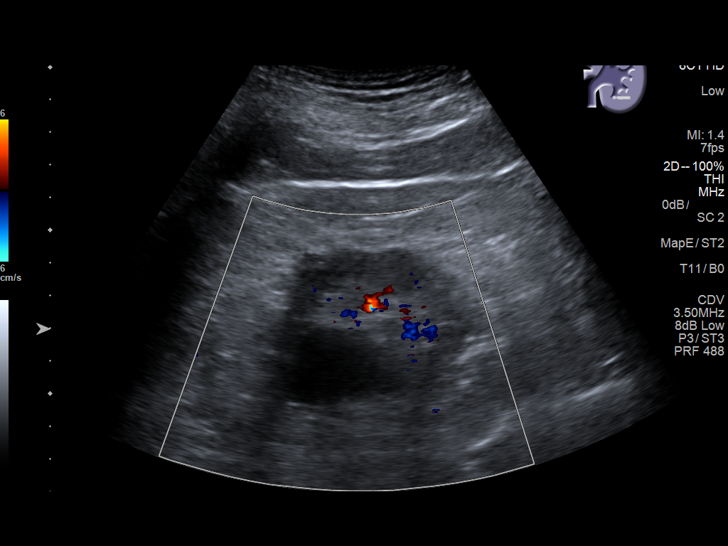
[im 31/37]
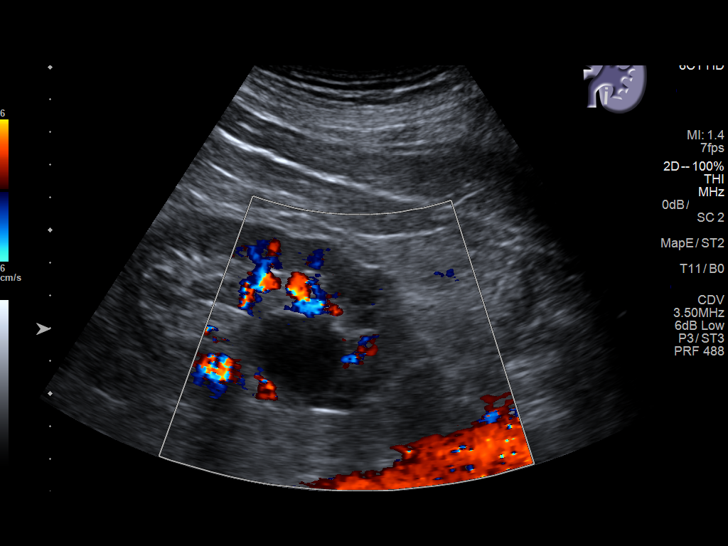
[im 34/37]
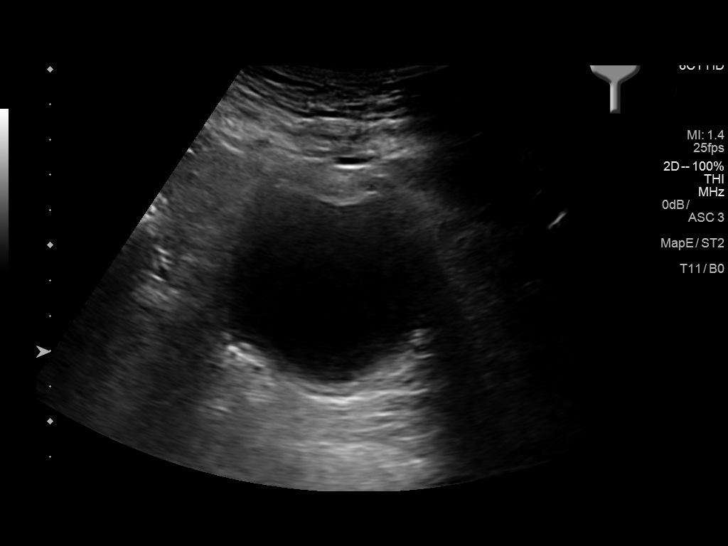
[im 37/37]
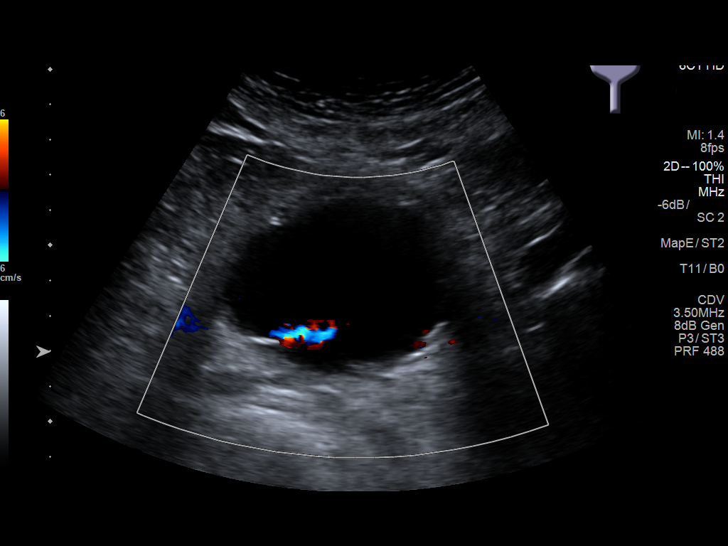

[14 of 25 positions shown; findings below may reference images not displayed]

FINDINGS: Right Kidney:

Renal measurements: 9.4 x 4.9 x 4.9 cm = volume: 118 mL .
Echogenicity within normal limits. No mass or hydronephrosis
visualized.

Left Kidney:

Renal measurements: 9.3 x 5.4 x 4.3 cm = volume: 113 mL. The renal
cortical echotexture is normal. There is a midpole cyst medially
measuring 3 x 2.4 x 3.4 cm. There is no hydronephrosis.

Bladder:

Appears normal for degree of bladder distention.
IMPRESSION: Normal size and echotexture of both kidneys. I can not exclude mild
cortical thinning bilaterally. The cortical echotexture remains
lower than that of the liver. There is a simple appearing midpole
cortical cyst on the left measuring up to 3.4 cm in diameter.

## 2019-05-02 ENCOUNTER — Other Ambulatory Visit: Payer: Self-pay | Admitting: Nurse Practitioner

## 2019-06-02 DIAGNOSIS — R809 Proteinuria, unspecified: Secondary | ICD-10-CM | POA: Diagnosis not present

## 2019-06-02 DIAGNOSIS — I10 Essential (primary) hypertension: Secondary | ICD-10-CM | POA: Diagnosis not present

## 2019-06-02 DIAGNOSIS — N1831 Chronic kidney disease, stage 3a: Secondary | ICD-10-CM | POA: Diagnosis not present

## 2019-06-02 DIAGNOSIS — D631 Anemia in chronic kidney disease: Secondary | ICD-10-CM | POA: Diagnosis not present

## 2019-07-04 ENCOUNTER — Telehealth: Payer: Self-pay | Admitting: Nurse Practitioner

## 2019-07-04 NOTE — Telephone Encounter (Signed)
Patient's daughter calling as FYI to state that patient has tested positive for covid.

## 2019-07-04 NOTE — Telephone Encounter (Signed)
Routing to provider, FYI.  

## 2019-08-01 ENCOUNTER — Other Ambulatory Visit: Payer: Self-pay | Admitting: Nurse Practitioner

## 2019-08-01 NOTE — Telephone Encounter (Signed)
Appointment 08/04/19 refilled per protocol

## 2019-08-04 ENCOUNTER — Encounter: Payer: Self-pay | Admitting: Family Medicine

## 2019-08-04 ENCOUNTER — Ambulatory Visit (INDEPENDENT_AMBULATORY_CARE_PROVIDER_SITE_OTHER): Payer: Medicare Other | Admitting: Family Medicine

## 2019-08-04 VITALS — BP 135/57 | HR 66 | Temp 96.9°F | Wt 151.0 lb

## 2019-08-04 DIAGNOSIS — E782 Mixed hyperlipidemia: Secondary | ICD-10-CM | POA: Diagnosis not present

## 2019-08-04 DIAGNOSIS — N1831 Chronic kidney disease, stage 3a: Secondary | ICD-10-CM

## 2019-08-04 DIAGNOSIS — I129 Hypertensive chronic kidney disease with stage 1 through stage 4 chronic kidney disease, or unspecified chronic kidney disease: Secondary | ICD-10-CM | POA: Diagnosis not present

## 2019-08-04 DIAGNOSIS — R7301 Impaired fasting glucose: Secondary | ICD-10-CM | POA: Diagnosis not present

## 2019-08-04 MED ORDER — LISINOPRIL 20 MG PO TABS: 20.0000 mg | ORAL_TABLET | Freq: Every day | ORAL | 1 refills | Status: DC

## 2019-08-04 MED ORDER — AMLODIPINE BESYLATE 5 MG PO TABS: 5.0000 mg | ORAL_TABLET | Freq: Every day | ORAL | 1 refills | Status: DC

## 2019-08-04 MED ORDER — ATORVASTATIN CALCIUM 40 MG PO TABS: ORAL_TABLET | ORAL | 1 refills | Status: DC

## 2019-08-04 NOTE — Progress Notes (Signed)
BP (!) 135/57   Pulse 66   Temp (!) 96.9 F (36.1 C) (Temporal)   Wt 151 lb (68.5 kg)   BMI 22.96 kg/m    Subjective:    Patient ID: Roger Byrd, male    DOB: [birth year], 79 y.o.   MRN: [id number]  HPI: Roger Byrd is a 79 y.o. male  Chief Complaint  Patient presents with  . Hypertension  . Hyperlipidemia    . This visit was completed via telephone due to the restrictions of the COVID-19 pandemic. All issues as above were discussed and addressed. Physical exam was done as above through visual confirmation on telephone. If it was felt that the patient should be evaluated in the office, they were directed there. The patient verbally consented to this visit. . Location of the patient: home . Location of the provider: work . Those involved with this call:  . Provider: Merrie Roof, PA-C . CMA: Lesle Chris, [place] . Front Desk/Registration: Jill Side  . Time spent on call: 25 minutes on the phone discussing health concerns. 5 minutes total spent in review of patient's record and preparation of their chart. I verified patient identity using two factors (patient name and date of birth). Patient consents verbally to being seen via telemedicine visit today.   Patient presenting today for 6 month f/u chronic conditions. Feeling very well overall. Taking medications faithfully without side effects. Denies CP, SOB, HAs, dizziness, claudication, myalgias. Staying active and eating a healthy diet.   Home BPs have been running 130s/70s on average.   BSs not checked at home. Diet controlled IFG.   Relevant past medical, surgical, family and social history reviewed and updated as indicated. Interim medical history since our last visit reviewed. Allergies and medications reviewed and updated.  Review of Systems  Per HPI unless specifically indicated above     Objective:    BP (!) 135/57   Pulse 66   Temp (!) 96.9 F (36.1 C) (Temporal)   Wt 151 lb (68.5 kg)   BMI 22.96  kg/m   Wt Readings from Last 3 Encounters:  08/04/19 151 lb (68.5 kg)  02/16/19 153 lb (69.4 kg)  02/08/19 153 lb (69.4 kg)    Physical Exam  Unable to perform PE due to patient lack of access to video technology for today's visit.   Results for orders placed or performed in visit on 02/08/19  Kappa/lambda light chains  Result Value Ref Range   Kappa free light chain 29.3 (H) 3.3 - 19.4 mg/L   Lamda free light chains 15.8 5.7 - 26.3 mg/L   Kappa, lamda light chain ratio 1.85 (H) 0.26 - 1.65  Multiple Myeloma Panel (SPEP&IFE w/QIG)  Result Value Ref Range   IgG (Immunoglobin G), Serum 779 603 - 1,613 mg/dL   IgA 142 61 - 437 mg/dL   IgM (Immunoglobulin M), Srm 65 15 - 143 mg/dL   Total Protein ELP 6.7 6.0 - 8.5 g/dL   Albumin SerPl Elph-Mcnc 3.9 2.9 - 4.4 g/dL   Alpha 1 0.2 0.0 - 0.4 g/dL   Alpha2 Glob SerPl Elph-Mcnc 0.8 0.4 - 1.0 g/dL   B-Globulin SerPl Elph-Mcnc 1.0 0.7 - 1.3 g/dL   Gamma Glob SerPl Elph-Mcnc 0.8 0.4 - 1.8 g/dL   M Protein SerPl Elph-Mcnc Not Observed Not Observed g/dL   Globulin, Total 2.8 2.2 - 3.9 g/dL   Albumin/Glob SerPl 1.4 0.7 - 1.7   IFE 1 Comment    Please Note Comment  Basic metabolic panel  Result Value Ref Range   Sodium 139 135 - 145 mmol/L   Potassium 4.3 3.5 - 5.1 mmol/L   Chloride 103 98 - 111 mmol/L   CO2 26 22 - 32 mmol/L   Glucose, Bld 135 (H) 70 - 99 mg/dL   BUN 28 (H) 8 - 23 mg/dL   Creatinine, Ser 1.35 (H) 0.61 - 1.24 mg/dL   Calcium 9.3 8.9 - 10.3 mg/dL   GFR calc non Af Amer 50 (L) >60 mL/min   GFR calc Af Amer 58 (L) >60 mL/min   Anion gap 10 5 - 15  CBC with Differential  Result Value Ref Range   WBC 5.7 4.0 - 10.5 K/uL   RBC 5.01 4.22 - 5.81 MIL/uL   Hemoglobin 13.5 13.0 - 17.0 g/dL   HCT 41.0 39.0 - 52.0 %   MCV 81.8 80.0 - 100.0 fL   MCH 26.9 26.0 - 34.0 pg   MCHC 32.9 30.0 - 36.0 g/dL   RDW 13.1 11.5 - 15.5 %   Platelets 309 150 - 400 K/uL   nRBC 0.0 0.0 - 0.2 %   Neutrophils Relative % 57 %   Neutro Abs 3.2  1.7 - 7.7 K/uL   Lymphocytes Relative 31 %   Lymphs Abs 1.8 0.7 - 4.0 K/uL   Monocytes Relative 7 %   Monocytes Absolute 0.4 0.1 - 1.0 K/uL   Eosinophils Relative 3 %   Eosinophils Absolute 0.2 0.0 - 0.5 K/uL   Basophils Relative 1 %   Basophils Absolute 0.1 0.0 - 0.1 K/uL   Immature Granulocytes 1 %   Abs Immature Granulocytes 0.03 0.00 - 0.07 K/uL      Assessment & Plan:   Problem List Items Addressed This Visit      Endocrine   IFG (impaired fasting glucose)    Diet controlled, stable and under good control. 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 Continue current regimen      Relevant Orders   Comprehensive metabolic panel (Completed)   CBC with Differential/Platelet (Completed)     Other   Hyperlipidemia    Recheck lipids, adjust as needed. Continue current regimen      Relevant Medications   lisinopril (ZESTRIL) 20 MG tablet   atorvastatin (LIPITOR) 40 MG tablet   amLODipine (NORVASC) 5 MG tablet   Other Relevant Orders   Lipid Panel w/o Chol/HDL Ratio (Completed)       Follow up plan: Return in about 6 months (around 02/01/2020) for CPE.

## 2019-08-05 ENCOUNTER — Other Ambulatory Visit: Payer: Self-pay

## 2019-08-05 ENCOUNTER — Other Ambulatory Visit: Payer: Medicare Other

## 2019-08-05 DIAGNOSIS — I129 Hypertensive chronic kidney disease with stage 1 through stage 4 chronic kidney disease, or unspecified chronic kidney disease: Secondary | ICD-10-CM | POA: Diagnosis not present

## 2019-08-05 DIAGNOSIS — D631 Anemia in chronic kidney disease: Secondary | ICD-10-CM

## 2019-08-05 DIAGNOSIS — N1831 Chronic kidney disease, stage 3a: Secondary | ICD-10-CM | POA: Diagnosis not present

## 2019-08-05 DIAGNOSIS — E782 Mixed hyperlipidemia: Secondary | ICD-10-CM

## 2019-08-05 DIAGNOSIS — N182 Chronic kidney disease, stage 2 (mild): Secondary | ICD-10-CM

## 2019-08-06 LAB — CBC WITH DIFFERENTIAL/PLATELET
Basophils Absolute: 0.1 10*3/uL (ref 0.0–0.2)
Basos: 1 %
EOS (ABSOLUTE): 0.2 10*3/uL (ref 0.0–0.4)
Eos: 3 %
Hematocrit: 37 % — ABNORMAL LOW (ref 37.5–51.0)
Hemoglobin: 12.7 g/dL — ABNORMAL LOW (ref 13.0–17.7)
Immature Grans (Abs): 0 10*3/uL (ref 0.0–0.1)
Immature Granulocytes: 0 %
Lymphocytes Absolute: 2 10*3/uL (ref 0.7–3.1)
Lymphs: 27 %
MCH: 27.5 pg (ref 26.6–33.0)
MCHC: 34.3 g/dL (ref 31.5–35.7)
MCV: 80 fL (ref 79–97)
Monocytes Absolute: 0.5 10*3/uL (ref 0.1–0.9)
Monocytes: 7 %
Neutrophils Absolute: 4.4 10*3/uL (ref 1.4–7.0)
Neutrophils: 62 %
Platelets: 291 10*3/uL (ref 150–450)
RBC: 4.62 x10E6/uL (ref 4.14–5.80)
RDW: 14.3 % (ref 11.6–15.4)
WBC: 7.2 10*3/uL (ref 3.4–10.8)

## 2019-08-06 LAB — COMPREHENSIVE METABOLIC PANEL
ALT: 15 IU/L (ref 0–44)
AST: 20 IU/L (ref 0–40)
Albumin/Globulin Ratio: 2 (ref 1.2–2.2)
Albumin: 4.4 g/dL (ref 3.7–4.7)
Alkaline Phosphatase: 71 IU/L (ref 39–117)
BUN/Creatinine Ratio: 15 (ref 10–24)
BUN: 22 mg/dL (ref 8–27)
Bilirubin Total: 0.4 mg/dL (ref 0.0–1.2)
CO2: 18 mmol/L — ABNORMAL LOW (ref 20–29)
Calcium: 9.2 mg/dL (ref 8.6–10.2)
Chloride: 104 mmol/L (ref 96–106)
Creatinine, Ser: 1.42 mg/dL — ABNORMAL HIGH (ref 0.76–1.27)
GFR calc Af Amer: 54 mL/min/{1.73_m2} — ABNORMAL LOW (ref >59)
GFR calc non Af Amer: 47 mL/min/{1.73_m2} — ABNORMAL LOW (ref >59)
Globulin, Total: 2.2 g/dL (ref 1.5–4.5)
Glucose: 91 mg/dL (ref 65–99)
Potassium: 4.3 mmol/L (ref 3.5–5.2)
Sodium: 139 mmol/L (ref 134–144)
Total Protein: 6.6 g/dL (ref 6.0–8.5)

## 2019-08-06 LAB — LIPID PANEL W/O CHOL/HDL RATIO
Cholesterol, Total: 172 mg/dL (ref 100–199)
HDL: 60 mg/dL (ref >39)
LDL Chol Calc (NIH): 86 mg/dL (ref 0–99)
Triglycerides: 151 mg/dL — ABNORMAL HIGH (ref 0–149)
VLDL Cholesterol Cal: 26 mg/dL (ref 5–40)

## 2019-08-07 NOTE — Assessment & Plan Note (Signed)
Diet controlled, stable and under good control. Continue good lifestyle habits

## 2019-08-07 NOTE — Assessment & Plan Note (Signed)
Recheck lipids, adjust as needed. Continue current regimen 

## 2019-08-07 NOTE — Assessment & Plan Note (Signed)
BPs stable and under good control. Continue current regimen 

## 2019-08-09 ENCOUNTER — Inpatient Hospital Stay: Payer: Medicare Other | Admitting: Internal Medicine

## 2019-08-09 ENCOUNTER — Other Ambulatory Visit: Payer: Self-pay

## 2019-08-09 ENCOUNTER — Inpatient Hospital Stay: Payer: Medicare Other | Attending: Internal Medicine

## 2019-08-09 DIAGNOSIS — N182 Chronic kidney disease, stage 2 (mild): Secondary | ICD-10-CM

## 2019-08-09 DIAGNOSIS — E079 Disorder of thyroid, unspecified: Secondary | ICD-10-CM | POA: Diagnosis not present

## 2019-08-09 DIAGNOSIS — D631 Anemia in chronic kidney disease: Secondary | ICD-10-CM | POA: Insufficient documentation

## 2019-08-09 DIAGNOSIS — F419 Anxiety disorder, unspecified: Secondary | ICD-10-CM | POA: Insufficient documentation

## 2019-08-09 DIAGNOSIS — N183 Chronic kidney disease, stage 3 unspecified: Secondary | ICD-10-CM | POA: Diagnosis not present

## 2019-08-09 DIAGNOSIS — G4700 Insomnia, unspecified: Secondary | ICD-10-CM | POA: Insufficient documentation

## 2019-08-09 DIAGNOSIS — Z7982 Long term (current) use of aspirin: Secondary | ICD-10-CM | POA: Diagnosis not present

## 2019-08-09 DIAGNOSIS — Z87891 Personal history of nicotine dependence: Secondary | ICD-10-CM | POA: Insufficient documentation

## 2019-08-09 DIAGNOSIS — D509 Iron deficiency anemia, unspecified: Secondary | ICD-10-CM | POA: Insufficient documentation

## 2019-08-09 DIAGNOSIS — I129 Hypertensive chronic kidney disease with stage 1 through stage 4 chronic kidney disease, or unspecified chronic kidney disease: Secondary | ICD-10-CM | POA: Diagnosis not present

## 2019-08-09 DIAGNOSIS — Z79899 Other long term (current) drug therapy: Secondary | ICD-10-CM | POA: Diagnosis not present

## 2019-08-09 DIAGNOSIS — E785 Hyperlipidemia, unspecified: Secondary | ICD-10-CM | POA: Diagnosis not present

## 2019-08-09 LAB — CBC WITH DIFFERENTIAL/PLATELET
Abs Immature Granulocytes: 0.01 10*3/uL (ref 0.00–0.07)
Basophils Absolute: 0.1 10*3/uL (ref 0.0–0.1)
Basophils Relative: 1 %
Eosinophils Absolute: 0.3 10*3/uL (ref 0.0–0.5)
Eosinophils Relative: 4 %
HCT: 38.4 % — ABNORMAL LOW (ref 39.0–52.0)
Hemoglobin: 12.3 g/dL — ABNORMAL LOW (ref 13.0–17.0)
Immature Granulocytes: 0 %
Lymphocytes Relative: 34 %
Lymphs Abs: 2.1 10*3/uL (ref 0.7–4.0)
MCH: 26.7 pg (ref 26.0–34.0)
MCHC: 32 g/dL (ref 30.0–36.0)
MCV: 83.3 fL (ref 80.0–100.0)
Monocytes Absolute: 0.5 10*3/uL (ref 0.1–1.0)
Monocytes Relative: 8 %
Neutro Abs: 3.3 10*3/uL (ref 1.7–7.7)
Neutrophils Relative %: 53 %
Platelets: 286 10*3/uL (ref 150–400)
RBC: 4.61 MIL/uL (ref 4.22–5.81)
RDW: 15 % (ref 11.5–15.5)
WBC: 6.3 10*3/uL (ref 4.0–10.5)
nRBC: 0 % (ref 0.0–0.2)

## 2019-08-09 LAB — IRON AND TIBC
Iron: 71 ug/dL (ref 45–182)
Saturation Ratios: 20 % (ref 17.9–39.5)
TIBC: 361 ug/dL (ref 250–450)
UIBC: 290 ug/dL

## 2019-08-09 LAB — FERRITIN: Ferritin: 30 ng/mL (ref 24–336)

## 2019-08-09 NOTE — Patient Instructions (Signed)
Vitron C [iron pill- over the counter]- one every other day.

## 2019-08-09 NOTE — Assessment & Plan Note (Addendum)
#   Iron deficiency/anemia/ CKD.  Hemoglobin 12.3; RECOMMEND vitron c [PO iron constipates]  # CKD stage III-creatnine 1.4-Stable.   # counselled re: vaccine. Awaiting with health couty dept.   # DISPOSITION: mychart report # Follow up in 6 months-MD/labs- cbc/bmp-/iron studies/ferritin- Dr.B 

## 2019-08-09 NOTE — Progress Notes (Signed)
[place] NOTE  Patient Care Team: Venita Lick, NP as PCP - General (Nurse Practitioner)  CHIEF COMPLAINTS/PURPOSE OF CONSULTATION:  Anemia  # ANEMIA/ CKD-II; last colo- 3 years [Dr.Elliot]; awaiting re-eval.   # CKD- stage II-III [Dr.Kolluru]  Oncology History   No history exists.   HISTORY OF PRESENTING ILLNESS:   Roger Byrd 79 y.o.  male with a history of mild CKD stage II-III and also iron deficient anemia-is here for follow-up.  Denies any nausea vomiting but appetite is good.  No weight loss.  No abdominal pain.  Review of Systems  Constitutional: Negative for chills, diaphoresis, fever, malaise/fatigue and weight loss.  HENT: Negative for nosebleeds and sore throat.   Eyes: Negative for double vision.  Respiratory: Negative for cough, hemoptysis, sputum production, shortness of breath and wheezing.   Cardiovascular: Negative for chest pain, palpitations, orthopnea and leg swelling.  Gastrointestinal: Negative for abdominal pain, blood in stool, constipation, diarrhea, heartburn, melena, nausea and vomiting.  Genitourinary: Negative for dysuria, frequency and urgency.  Musculoskeletal: Negative for back pain and joint pain.  Skin: Negative.  Negative for itching and rash.  Neurological: Negative for dizziness, tingling, focal weakness, weakness and headaches.  Endo/Heme/Allergies: Does not bruise/bleed easily.  Psychiatric/Behavioral: Negative for depression. The patient is not nervous/anxious and does not have insomnia.      MEDICAL HISTORY:  Past Medical History:  Diagnosis Date  . Anxiety   . Chronic kidney disease   . Hyperlipidemia   . Hypertension   . Insomnia   . Thyroid disease     SURGICAL HISTORY: Past Surgical History:  Procedure Laterality Date  . BACK SURGERY     x2  . CARPAL TUNNEL RELEASE Right 03/08/2018   Procedure: CARPAL TUNNEL RELEASE;  Surgeon: Earnestine Leys, MD;  Location: [facility] ORS;  Service:  Orthopedics;  Laterality: Right;  . COLONOSCOPY WITH PROPOFOL N/A 07/19/2015   Procedure: COLONOSCOPY WITH PROPOFOL;  Surgeon: Manya Silvas, MD;  Location: [facility] ENDOSCOPY;  Service: Endoscopy;  Laterality: N/A;    SOCIAL HISTORY: Social History   Socioeconomic History  . Marital status: Widowed    Spouse name: Not on file  . Number of children: Not on file  . Years of education: 41  . Highest education level: 12th grade  Occupational History  . Not on file  Tobacco Use  . Smoking status: Former Smoker    Quit date: 08/08/1970    Years since quitting: 49.0  . Smokeless tobacco: Never Used  Substance and Sexual Activity  . Alcohol use: Yes    Alcohol/week: 14.0 standard drinks    Types: 14 Glasses of wine per week  . Drug use: No  . Sexual activity: Not Currently  Other Topics Concern  . Not on file  Social History Narrative  . Not on file   Social Determinants of Health   Financial Resource Strain:   . Difficulty of Paying Living Expenses: Not on file  Food Insecurity:   . Worried About Charity fundraiser in the Last Year: Not on file  . Ran Out of Food in the Last Year: Not on file  Transportation Needs:   . Lack of Transportation (Medical): Not on file  . Lack of Transportation (Non-Medical): Not on file  Physical Activity:   . Days of Exercise per Week: Not on file  . Minutes of Exercise per Session: Not on file  Stress:   . Feeling of Stress : Not on file  Social Connections:   . Frequency of Communication with Friends and Family: Not on file  . Frequency of Social Gatherings with Friends and Family: Not on file  . Attends Religious Services: Not on file  . Active Member of Clubs or Organizations: Not on file  . Attends Archivist Meetings: Not on file  . Marital Status: Not on file  Intimate Partner Violence:   . Fear of Current or Ex-Partner: Not on file  . Emotionally Abused: Not on file  . Physically Abused: Not on file  . Sexually Abused:  Not on file    FAMILY HISTORY: Family History  Problem Relation Age of Onset  . Hypertension Mother   . Heart disease Mother        heart attack  . Hypertension Sister   . Emphysema Father   . Hypertension Daughter   . Hypertension Son   . Hypertension Sister   . Hypertension Sister   . Emphysema Sister     ALLERGIES:  has No Known Allergies.  MEDICATIONS:  Current Outpatient Medications  Medication Sig Dispense Refill  . amLODipine (NORVASC) 5 MG tablet Take 1 tablet (5 mg total) by mouth daily. 90 tablet 1  . aspirin 81 MG tablet Take 81 mg by mouth every evening.     Marland Kitchen atorvastatin (LIPITOR) 40 MG tablet TAKE 1 TABLET BY MOUTH DAILY AT 6 PM. 90 tablet 1  . lisinopril (ZESTRIL) 20 MG tablet Take 1 tablet (20 mg total) by mouth daily. 90 tablet 1   No current facility-administered medications for this visit.      Marland Kitchen  PHYSICAL EXAMINATION: ECOG PERFORMANCE STATUS: 0 - Asymptomatic  Vitals:   08/09/19 1349 08/09/19 1350  BP: (!) 156/64   Pulse: 66   Resp: 20   Temp:  97.7 F (36.5 C)   Filed Weights   08/09/19 1349  Weight: 150 lb 12.8 oz (68.4 kg)    Physical Exam  Constitutional: He is oriented to person, place, and time and well-developed, well-nourished, and in no distress.  HENT:  Head: Normocephalic and atraumatic.  Mouth/Throat: Oropharynx is clear and moist. No oropharyngeal exudate.  Eyes: Pupils are equal, round, and reactive to light.  Cardiovascular: Normal rate and regular rhythm.  Pulmonary/Chest: No respiratory distress. He has no wheezes.  Abdominal: Soft. Bowel sounds are normal. He exhibits no distension and no mass. There is no abdominal tenderness. There is no rebound and no guarding.  Musculoskeletal:        General: No tenderness or edema. Normal range of motion.     Cervical back: Normal range of motion and neck supple.  Neurological: He is alert and oriented to person, place, and time.  Skin: Skin is warm.  Psychiatric: Affect  normal.     LABORATORY DATA:  I have reviewed the data as listed Lab Results  Component Value Date   WBC 6.3 08/09/2019   HGB 12.3 (L) 08/09/2019   HCT 38.4 (L) 08/09/2019   MCV 83.3 08/09/2019   PLT 286 08/09/2019   Recent Labs    01/27/19 1624 02/08/19 1339 08/05/19 1420  NA 139 139 139  K 4.5 4.3 4.3  CL 100 103 104  CO2 20 26 18*  GLUCOSE 96 135* 91  BUN 19 28* 22  CREATININE 1.18 1.35* 1.42*  CALCIUM 9.3 9.3 9.2  GFRNONAA 59* 50* 47*  GFRAA 68 58* 54*  PROT 7.2  --  6.6  ALBUMIN 4.6  --  4.4  AST 20  --  20  ALT 17  --  15  ALKPHOS 62  --  71  BILITOT 0.4  --  0.4    RADIOGRAPHIC STUDIES: I have personally reviewed the radiological images as listed and agreed with the findings in the report. No results found.  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  Hemoglobin 12.5; RECOMMEND vitron c [PO iron constipates]  # CKD stage III-creatnine 1.4-Stable.   # counselled re: vaccine. Awaiting with health couty dept.   # DISPOSITION: mychart report # Follow up in 6 months-MD/labs- cbc/bmp-/iron studies/ferritin- Dr.B  All questions were answered. The patient knows to call the clinic with any problems, questions or concerns.    Cammie Sickle, MD 08/15/2019 7:20 AM

## 2019-08-17 ENCOUNTER — Telehealth: Payer: Self-pay | Admitting: Nurse Practitioner

## 2019-08-17 NOTE — Telephone Encounter (Signed)
Copied from [place] [id number]. Topic: General - Inquiry >> Aug 17, 2019  9:52 AM Percell Belt A wrote: Reason for CRM: pt called in stated that he hurt has back a couple of years ago. He stated he is having trouble with his back recently and would like to know if Jolene would call in prednisone for him.  He has not seen her recently for this and told him that he may need to make an appt but pt wanted me to send message away .  He didn't really want to make an appt but advised it may be needed  Best number [phone] >> Aug 17, 2019 10:51 AM Jill Side wrote: Please advise

## 2019-08-17 NOTE — Telephone Encounter (Signed)
Scheduled virtual for tomorrow  

## 2019-08-18 ENCOUNTER — Telehealth (INDEPENDENT_AMBULATORY_CARE_PROVIDER_SITE_OTHER): Payer: Medicare Other | Admitting: Nurse Practitioner

## 2019-08-18 ENCOUNTER — Encounter: Payer: Self-pay | Admitting: Nurse Practitioner

## 2019-08-18 DIAGNOSIS — M545 Low back pain, unspecified: Secondary | ICD-10-CM

## 2019-08-18 MED ORDER — PREDNISONE 10 MG PO TABS: ORAL_TABLET | ORAL | 0 refills | Status: DC

## 2019-08-18 NOTE — Assessment & Plan Note (Signed)
Acute, will send in script for Prednisone taper.  Recommend continued use of heat/ice + Tylenol as needed.  If worsening pain or continued pain then to return to office for face to face visit.

## 2019-08-18 NOTE — Progress Notes (Signed)
BP 140/68   Pulse 64   Temp 97.8 F (36.6 C)   Wt 151 lb (68.5 kg)   BMI 22.96 kg/m    Subjective:    Patient ID: Roger Byrd, male    DOB: 05/02/1941, 79 y.o.   MRN: [id number]  HPI: Roger Byrd is a 79 y.o. male  Chief Complaint  Patient presents with  . Back Pain    . This visit was completed via MyChart Video due to the restrictions of the COVID-19 pandemic. All issues as above were discussed and addressed. Physical exam was done as above through visual confirmation on MyChart Video. If it was felt that the patient should be evaluated in the office, they were directed there. The patient verbally consented to this visit. . Location of the patient: home . Location of the provider: home . Those involved with this call:  . Provider: Marnee Guarneri, DNP . CMA: Merilyn Baba, CMA . Front Desk/Registration: Don Perking  . Time spent on call: 15 minutes with patient face to face via video conference. More than 50% of this time was spent in counseling and coordination of care. 10 minutes total spent in review of patient's record and preparation of their chart.  . I verified patient identity using two factors (patient name and date of birth). Patient consents verbally to being seen via telemedicine visit today.    BACK PAIN Started having back pain last Wednesday while playing golf, then on Thursday went to church and worked on building, then after this he could not move.  Had back surgery years ago (1972) and reports he had MRI after and was told it could get inflamed from time to time.  Only thing that works is Prednisone taper to improve this.  Last imaging in 2015 did note lumbar spondylosis and lower lumbar degenerative disc disease. Duration: weeks Mechanism of injury: unknown Location: Left, midline and low back Onset: gradual Severity: 10/10 at worst, after ice this improves Quality: sharp, dull and aching Frequency: intermittent Radiation: none Aggravating  factors: lifting, movement and bending Alleviating factors: rest, ice, heat and APAP Status: fluctuating Treatments attempted: rest, ice, heat and APAP  Relief with NSAIDs?: No NSAIDs Taken Nighttime pain:  no Paresthesias / decreased sensation:  no Bowel / bladder incontinence:  no Fevers:  no Dysuria / urinary frequency:  no  Relevant past medical, surgical, family and social history reviewed and updated as indicated. Interim medical history since our last visit reviewed. Allergies and medications reviewed and updated.  Review of Systems  Constitutional: Negative for activity change, diaphoresis, fatigue and fever.  Respiratory: Negative for cough, chest tightness, shortness of breath and wheezing.   Cardiovascular: Negative for chest pain, palpitations and leg swelling.  Gastrointestinal: Negative.   Musculoskeletal: Positive for back pain.  Skin: Negative.   Neurological: Negative.   Psychiatric/Behavioral: Negative.     Per HPI unless specifically indicated above     Objective:    BP 140/68   Pulse 64   Temp 97.8 F (36.6 C)   Wt 151 lb (68.5 kg)   BMI 22.96 kg/m   Wt Readings from Last 3 Encounters:  08/18/19 151 lb (68.5 kg)  08/09/19 150 lb 12.8 oz (68.4 kg)  08/04/19 151 lb (68.5 kg)    Physical Exam Vitals and nursing note reviewed.  Constitutional:      General: He is awake. He is not in acute distress.    Appearance: He is well-developed. He is not ill-appearing.  HENT:     Head: Normocephalic.     Right Ear: Hearing normal. No drainage.     Left Ear: Hearing normal. No drainage.  Eyes:     General: Lids are normal.        Right eye: No discharge.        Left eye: No discharge.     Conjunctiva/sclera: Conjunctivae normal.  Pulmonary:     Effort: Pulmonary effort is normal. No accessory muscle usage or respiratory distress.  Musculoskeletal:     Cervical back: Normal range of motion.     Lumbar back: Decreased range of motion.     Comments:  Viewed via virtual, he denies any rashes or swelling to low back.  Neurological:     Mental Status: He is alert and oriented to person, place, and time.  Psychiatric:        Mood and Affect: Mood normal.        Behavior: Behavior normal. Behavior is cooperative.        Thought Content: Thought content normal.        Judgment: Judgment normal.     Results for orders placed or performed in visit on 08/09/19  Ferritin  Result Value Ref Range   Ferritin 30 24 - 336 ng/mL  Iron and TIBC  Result Value Ref Range   Iron 71 45 - 182 ug/dL   TIBC 361 250 - 450 ug/dL   Saturation Ratios 20 17.9 - 39.5 %   UIBC 290 ug/dL  CBC with Differential/Platelet  Result Value Ref Range   WBC 6.3 4.0 - 10.5 K/uL   RBC 4.61 4.22 - 5.81 MIL/uL   Hemoglobin 12.3 (L) 13.0 - 17.0 g/dL   HCT 38.4 (L) 39.0 - 52.0 %   MCV 83.3 80.0 - 100.0 fL   MCH 26.7 26.0 - 34.0 pg   MCHC 32.0 30.0 - 36.0 g/dL   RDW 15.0 11.5 - 15.5 %   Platelets 286 150 - 400 K/uL   nRBC 0.0 0.0 - 0.2 %   Neutrophils Relative % 53 %   Neutro Abs 3.3 1.7 - 7.7 K/uL   Lymphocytes Relative 34 %   Lymphs Abs 2.1 0.7 - 4.0 K/uL   Monocytes Relative 8 %   Monocytes Absolute 0.5 0.1 - 1.0 K/uL   Eosinophils Relative 4 %   Eosinophils Absolute 0.3 0.0 - 0.5 K/uL   Basophils Relative 1 %   Basophils Absolute 0.1 0.0 - 0.1 K/uL   Immature Granulocytes 0 %   Abs Immature Granulocytes 0.01 0.00 - 0.07 K/uL      Assessment & Plan:   Problem List Items Addressed This Visit      Other   Low back pain    Acute, will send in script for Prednisone taper.  Recommend continued use of heat/ice + Tylenol as needed.  If worsening pain or continued pain then to return to office for face to face visit.      Relevant Medications   predniSONE (DELTASONE) 10 MG tablet      I discussed the assessment and treatment plan with the patient. The patient was provided an opportunity to ask questions and all were answered. The patient agreed with the  plan and demonstrated an understanding of the instructions.   The patient was advised to call back or seek an in-person evaluation if the symptoms worsen or if the condition fails to improve as anticipated.   I provided 15 minutes of time during this  encounter.  Follow up plan: Return if symptoms worsen or fail to improve.

## 2019-08-18 NOTE — Patient Instructions (Signed)
Acute Back Pain, Adult Acute back pain is sudden and usually short-lived. It is often caused by an injury to the muscles and tissues in the back. The injury may result from:  A muscle or ligament getting overstretched or torn (strained). Ligaments are tissues that connect bones to each other. Lifting something improperly can cause a back strain.  Wear and tear (degeneration) of the spinal disks. Spinal disks are circular tissue that provides cushioning between the bones of the spine (vertebrae).  Twisting motions, such as while playing sports or doing yard work.  A hit to the back.  Arthritis. You may have a physical exam, lab tests, and imaging tests to find the cause of your pain. Acute back pain usually goes away with rest and home care. Follow these instructions at home: Managing pain, stiffness, and swelling  Take over-the-counter and prescription medicines only as told by your health care provider.  Your health care provider may recommend applying ice during the first 24-48 hours after your pain starts. To do this: ? Put ice in a plastic bag. ? Place a towel between your skin and the bag. ? Leave the ice on for 20 minutes, 2-3 times a day.  If directed, apply heat to the affected area as often as told by your health care provider. Use the heat source that your health care provider recommends, such as a moist heat pack or a heating pad. ? Place a towel between your skin and the heat source. ? Leave the heat on for 20-30 minutes. ? Remove the heat if your skin turns bright red. This is especially important if you are unable to feel pain, heat, or cold. You have a greater risk of getting burned. Activity   Do not stay in bed. Staying in bed for more than 1-2 days can delay your recovery.  Sit up and stand up straight. Avoid leaning forward when you sit, or hunching over when you stand. ? If you work at a desk, sit close to it so you do not need to lean over. Keep your chin tucked  in. Keep your neck drawn back, and keep your elbows bent at a right angle. Your arms should look like the letter "L." ? Sit high and close to the steering wheel when you drive. Add lower back (lumbar) support to your car seat, if needed.  Take short walks on even surfaces as soon as you are able. Try to increase the length of time you walk each day.  Do not sit, drive, or stand in one place for more than 30 minutes at a time. Sitting or standing for long periods of time can put stress on your back.  Do not drive or use heavy machinery while taking prescription pain medicine.  Use proper lifting techniques. When you bend and lift, use positions that put less stress on your back: ? Bend your knees. ? Keep the load close to your body. ? Avoid twisting.  Exercise regularly as told by your health care provider. Exercising helps your back heal faster and helps prevent back injuries by keeping muscles strong and flexible.  Work with a physical therapist to make a safe exercise program, as recommended by your health care provider. Do any exercises as told by your physical therapist. Lifestyle  Maintain a healthy weight. Extra weight puts stress on your back and makes it difficult to have good posture.  Avoid activities or situations that make you feel anxious or stressed. Stress and anxiety increase muscle   tension and can make back pain worse. Learn ways to manage anxiety and stress, such as through exercise. General instructions  Sleep on a firm mattress in a comfortable position. Try lying on your side with your knees slightly bent. If you lie on your back, put a pillow under your knees.  Follow your treatment plan as told by your health care provider. This may include: ? Cognitive or behavioral therapy. ? Acupuncture or massage therapy. ? Meditation or yoga. Contact a health care provider if:  You have pain that is not relieved with rest or medicine.  You have increasing pain going down  into your legs or buttocks.  Your pain does not improve after 2 weeks.  You have pain at night.  You lose weight without trying.  You have a fever or chills. Get help right away if:  You develop new bowel or bladder control problems.  You have unusual weakness or numbness in your arms or legs.  You develop nausea or vomiting.  You develop abdominal pain.  You feel faint. Summary  Acute back pain is sudden and usually short-lived.  Use proper lifting techniques. When you bend and lift, use positions that put less stress on your back.  Take over-the-counter and prescription medicines and apply heat or ice as directed by your health care provider. This information is not intended to replace advice given to you by your health care provider. Make sure you discuss any questions you have with your health care provider. Document Revised: 10/05/2018 Document Reviewed: 01/28/2017 Elsevier Patient Education  2020 Elsevier Inc.  

## 2019-09-15 ENCOUNTER — Ambulatory Visit: Payer: Self-pay | Admitting: Nurse Practitioner

## 2019-09-15 NOTE — Telephone Encounter (Signed)
Patient notified and verbalized understanding. 

## 2019-09-15 NOTE — Telephone Encounter (Signed)
Please let patient know to continue to get plenty of rest and fluids + take Tylenol as needed.  We are finding itt is common after second vaccine to have some flu-like symptoms for 24 hours.  If any worsening or ongoing symptoms then would like him to be seen immediately for further evaluation.

## 2019-09-15 NOTE — Telephone Encounter (Signed)
Pt's daughter calling, on [place].  States pt had 2nd covid vaccine yesterday, Pfizer States felt fine all day yesterday, woke at 2am with sweating, chills, fever. States temp max at 103.0 today. Reports down to 102.0 , presently 99.0 without taking any antipyretics. States is staying hydrated. Denies any other symptoms.  Care advise given per covid vaccine protocol. Assured NT would route to practice for PCPs review.  Advised if any additional advise, concerns she will hear from practice.   Requested she be called instead of pt if necessary.  [phone]  Reason for Disposition . COVID-19 vaccine, systemic reactions (e.g., fatigue, fever, muscle aches), questions about  Answer Assessment - Initial Assessment Questions 1. MAIN CONCERN OR SYMPTOM:  "What is your main concern right now?" "What question do you have?" "What's the main symptom you're worried about?" (e.g., fever, pain, redness, swelling)     Fever 2. VACCINE: "What vaccination did you receive?" "Is this your first or second shot?" (e.g., none; Moderna, [employer], other)     2nd Herbalist 3. SYMPTOM ONSET: "When did the *No Answer* begin?" (e.g., not relevant; hours, days)      2am 4. SYMPTOM SEVERITY: "How bad is it?"      103.0 to 10.0 presently 99.0 Has not taken any antipyretics. 5. FEVER: "Is there a fever?" If so, ask: "What is it, how was it measured, and when did it start?"       6. PAST REACTIONS: "Have you reacted to immunizations before?" If so, ask: "What happened?"     no 7. OTHER SYMPTOMS: "Do you have any other symptoms?"     no  Protocols used: CORONAVIRUS (COVID-19) VACCINE QUESTIONS AND REACTIONS-A-AH

## 2019-10-13 DIAGNOSIS — D631 Anemia in chronic kidney disease: Secondary | ICD-10-CM | POA: Diagnosis not present

## 2019-10-13 DIAGNOSIS — I10 Essential (primary) hypertension: Secondary | ICD-10-CM | POA: Diagnosis not present

## 2019-10-13 DIAGNOSIS — N1832 Chronic kidney disease, stage 3b: Secondary | ICD-10-CM | POA: Diagnosis not present

## 2019-10-13 DIAGNOSIS — R809 Proteinuria, unspecified: Secondary | ICD-10-CM | POA: Diagnosis not present

## 2019-11-14 ENCOUNTER — Ambulatory Visit (INDEPENDENT_AMBULATORY_CARE_PROVIDER_SITE_OTHER): Payer: Medicare Other

## 2019-11-14 VITALS — BP 140/63 | HR 65 | Temp 97.5°F | Ht 68.0 in | Wt 153.0 lb

## 2019-11-14 DIAGNOSIS — Z0000 Encounter for general adult medical examination without abnormal findings: Secondary | ICD-10-CM | POA: Diagnosis not present

## 2019-11-14 NOTE — Progress Notes (Signed)
Subjective:   Roger Byrd is a 79 y.o. male who presents for Medicare Annual/Subsequent preventive examination.  I connected with [name] today by telephone and verified that I am speaking with the correct person using two identifiers. Location patient: home Location provider: work Persons participating in the virtual visit: patient, provider.   I discussed the limitations, risks, security and privacy concerns of performing an evaluation and management service by telephone and the availability of in person appointments. I also discussed with the patient that there may be a patient responsible charge related to this service. The patient expressed understanding and verbally consented to this telephonic visit.    Interactive audio and video telecommunications were attempted between this provider and patient, however failed, due to patient having technical difficulties OR patient did not have access to video capability.  We continued and completed visit with audio only.  Some vital signs may be absent or patient reported.   Time Spent with patient on telephone encounter: 15 minutes  Review of Systems:   Cardiac Risk Factors include: advanced age (>27men, >50 women);dyslipidemia;male gender     Objective:    Vitals: BP 140/63   Pulse 65   Temp (!) 97.5 F (36.4 C)   Ht 5\' 8"  (1.727 m)   Wt 153 lb (69.4 kg)   BMI 23.26 kg/m   Body mass index is 23.26 kg/m.  Advanced Directives 11/14/2019 02/08/2019 11/09/2018 08/10/2018 05/10/2018 03/04/2018 05/28/2017  Does Patient Have a Medical Advance Directive? No No Yes No No No No  Type of Advance Directive - - Living will;Healthcare Power of Attorney - - - -  Does patient want to make changes to medical advance directive? - - No - Patient declined - - - -  Copy of [place] in Chart? - - No - copy requested - - - -  Would patient like information on creating a medical advance directive? - No - Patient declined - No -  Patient declined - No - Patient declined Yes (MAU/Ambulatory/Procedural Areas - Information given)    Tobacco Social History   Tobacco Use  Smoking Status Former Smoker  . Quit date: 08/08/1970  . Years since quitting: 49.3  Smokeless Tobacco Never Used     Counseling given: Not Answered   Clinical Intake:  Pre-visit preparation completed: Yes  Pain : No/denies pain     Nutritional Status: BMI of 19-24  Normal Nutritional Risks: None Diabetes: No  How often do you need to have someone help you when you read instructions, pamphlets, or other written materials from your doctor or pharmacy?: 1 - Never  Interpreter Needed?: No  Information entered by :: Monie Shere,LPN  Past Medical History:  Diagnosis Date  . Anxiety   . Chronic kidney disease   . Hyperlipidemia   . Hypertension   . Insomnia   . Thyroid disease    Past Surgical History:  Procedure Laterality Date  . BACK SURGERY     x2  . CARPAL TUNNEL RELEASE Right 03/08/2018   Procedure: CARPAL TUNNEL RELEASE;  Surgeon: Earnestine Leys, MD;  Location: [facility] ORS;  Service: Orthopedics;  Laterality: Right;  . COLONOSCOPY WITH PROPOFOL N/A 07/19/2015   Procedure: COLONOSCOPY WITH PROPOFOL;  Surgeon: Manya Silvas, MD;  [facility]COPY;  Service: Endoscopy;  Laterality: N/A;   Family History  Problem Relation Age of Onset  . Hypertension Mother   . Heart disease Mother        heart attack  .  Hypertension Sister   . Emphysema Father   . Hypertension Daughter   . Hypertension Son   . Hypertension Sister   . Hypertension Sister   . Emphysema Sister    Social History   Socioeconomic History  . Marital status: Widowed    Spouse name: Not on file  . Number of children: Not on file  . Years of education: 38  . Highest education level: 12th grade  Occupational History  . Occupation: retired   Tobacco Use  . Smoking status: Former Smoker    Quit date: 08/08/1970    Years since quitting: 49.3  .  Smokeless tobacco: Never Used  Substance and Sexual Activity  . Alcohol use: Yes    Alcohol/week: 14.0 standard drinks    Types: 14 Glasses of wine per week    Comment: 6 oz glasses/ red wine   . Drug use: No  . Sexual activity: Not Currently  Other Topics Concern  . Not on file  Social History Narrative  . Not on file   Social Determinants of Health   Financial Resource Strain: Low Risk   . Difficulty of Paying Living Expenses: Not hard at all  Food Insecurity: No Food Insecurity  . Worried About Charity fundraiser in the Last Year: Never true  . Ran Out of Food in the Last Year: Never true  Transportation Needs:   . Lack of Transportation (Medical):   Marland Kitchen Lack of Transportation (Non-Medical):   Physical Activity:   . Days of Exercise per Week:   . Minutes of Exercise per Session:   Stress:   . Feeling of Stress :   Social Connections: Slightly Isolated  . Frequency of Communication with Friends and Family: More than three times a week  . Frequency of Social Gatherings with Friends and Family: More than three times a week  . Attends Religious Services: More than 4 times per year  . Active Member of Clubs or Organizations: Yes  . Attends Archivist Meetings: More than 4 times per year  . Marital Status: Widowed    Outpatient Encounter Medications as of 11/14/2019  Medication Sig  . amLODipine (NORVASC) 5 MG tablet Take 1 tablet (5 mg total) by mouth daily.  Marland Kitchen aspirin 81 MG tablet Take 81 mg by mouth every evening.   Marland Kitchen atorvastatin (LIPITOR) 40 MG tablet TAKE 1 TABLET BY MOUTH DAILY AT 6 PM.  . lisinopril (ZESTRIL) 20 MG tablet Take 1 tablet (20 mg total) by mouth daily.  . [DISCONTINUED] Iron Combinations (IRON COMPLEX PO) Take by mouth.  . [DISCONTINUED] predniSONE (DELTASONE) 10 MG tablet Take 6 tablets by mouth daily for 2 days, then reduce by 1 tablet every 2 days until gone   No facility-administered encounter medications on file as of 11/14/2019.     Activities of Daily Living In your present state of health, do you have any difficulty performing the following activities: 11/14/2019 01/27/2019  Hearing? N N  Comment no hearing aids -  Vision? N N  Comment eyeglasses, [facility] -  Difficulty concentrating or making decisions? N N  Walking or climbing stairs? N N  Dressing or bathing? N N  Doing errands, shopping? N N  Preparing Food and eating ? N -  Using the Toilet? N -  In the past six months, have you accidently leaked urine? N -  Do you have problems with loss of bowel control? N -  Managing your Medications? N -  Managing  your Finances? N -  Housekeeping or managing your Housekeeping? N -  Some recent data might be hidden    Patient Care Team: Venita Lick, NP as PCP - General (Nurse Practitioner) Anthonette Legato, MD (Nephrology) Cammie Sickle, MD as Consulting Physician (Internal Medicine)   Assessment:   This is a routine wellness examination for Ahmar.  Exercise Activities and Dietary recommendations Current Exercise Habits: Structured exercise class, Time (Minutes): 30, Frequency (Times/Week): 3, Weekly Exercise (Minutes/Week): 90, Intensity: Mild, Exercise limited by: None identified  Goals Addressed   None     Fall Risk: Fall Risk  11/14/2019 01/27/2019 02/18/2018 05/28/2017 05/27/2016  Falls in the past year? 0 0 No No No  Number falls in past yr: 0 - - - -  Injury with Fall? 0 - - - -  Follow up - Falls evaluation completed - - -    FALL RISK PREVENTION PERTAINING TO THE HOME:  Any stairs in or around the home? Yes  If so, are there any without handrails? No   Home free of loose throw rugs in walkways, pet beds, electrical cords, etc? Yes  Adequate lighting in your home to reduce risk of falls? Yes   ASSISTIVE DEVICES UTILIZED TO PREVENT FALLS:  Life alert? No  Use of a cane, walker or w/c? No  Grab bars in the bathroom? No  Shower chair or bench in shower? No  Elevated  toilet seat or a handicapped toilet? No   TIMED UP AND GO:  Unable to perform   Depression Screen PHQ 2/9 Scores 11/14/2019 08/18/2019 05/28/2017 05/27/2016  PHQ - 2 Score 0 0 0 0  PHQ- 9 Score - - 0 0    Cognitive Function     6CIT Screen 05/28/2017  What Year? 0 points  What month? 0 points  What time? 0 points  Count back from 20 0 points  Months in reverse 0 points  Repeat phrase 2 points  Total Score 2    Immunization History  Administered Date(s) Administered  . Influenza, High Dose Seasonal PF 05/14/2018, 04/19/2019  . Influenza,inj,Quad PF,6+ Mos 04/24/2017  . Influenza-Unspecified 03/18/2016, 04/24/2017  . PFIZER SARS-COV-2 Vaccination 08/08/2019, 08/29/2019  . Pneumococcal Conjugate-13 04/10/2014  . Pneumococcal Polysaccharide-23 01/02/2008  . Td 12/20/2009  . Zoster 01/02/2008    Qualifies for Shingles Vaccine? Yes  Zostavax completed n/a. Due for Shingrix. Education has been provided regarding the importance of this vaccine. Pt has been advised to call insurance company to determine out of pocket expense. Advised may also receive vaccine at local pharmacy or Health Dept. Verbalized acceptance and understanding.  Tdap: up to date .  Flu Vaccine: up to date   Pneumococcal Vaccine: up to date   Covid-19 Vaccine: Completed vaccines  Screening Tests Health Maintenance  Topic Date Due  . TETANUS/TDAP  12/21/2019  . INFLUENZA VACCINE  01/29/2020  . COLONOSCOPY  07/18/2020  . COVID-19 Vaccine  Completed  . PNA vac Low Risk Adult  Completed   Cancer Screenings:  Colorectal Screening: no longer required   Lung Cancer Screening: (Low Dose CT Chest recommended if Age 51-80 years, 30 pack-year currently smoking OR have quit w/in 15years.) does not qualify.     Additional Screening:  Hepatitis C Screening: does not qualify  Vision Screening: Recommended annual ophthalmology exams for early detection of glaucoma and other disorders of the eye. Is the  patient up to date with their annual eye exam?  No    Dental Screening: Recommended  annual dental exams for proper oral hygiene  Community Resource Referral:  CRR required this visit?  No        Plan:  I have personally reviewed and addressed the Medicare Annual Wellness questionnaire and have noted the following in the patient's chart:  A. Medical and social history B. Use of alcohol, tobacco or illicit drugs  C. Current medications and supplements D. Functional ability and status E.  Nutritional status F.  Physical activity G. Advance directives H. List of other physicians I.  Hospitalizations, surgeries, and ER visits in previous 12 months J.  [place] such as hearing and vision if needed, cognitive and depression L. Referrals and appointments   In addition, I have reviewed and discussed with patient certain preventive protocols, quality metrics, and best practice recommendations. A written personalized care plan for preventive services as well as general preventive health recommendations were provided to patient.   Signed,   Bevelyn Ngo, LPN  [id number] Nurse Health Advisor   Nurse Notes: none

## 2019-11-14 NOTE — Patient Instructions (Signed)
Roger Byrd , Thank you for taking time to come for your Medicare Wellness Visit. I appreciate your ongoing commitment to your health goals. Please review the following plan we discussed and let me know if I can assist you in the future.   Screening recommendations/referrals: Colonoscopy: no longer required  Recommended yearly ophthalmology/optometry visit for glaucoma screening and checkup Recommended yearly dental visit for hygiene and checkup  Vaccinations: Influenza vaccine: up to date, due 02/2020 Pneumococcal vaccine: completed series  Tdap vaccine: up to date  Shingles vaccine: shingrix eligible    Covid-19: completed series   Advanced directives: Advance directive discussed with you today. I have provided a copy for you to complete at home and have notarized. Once this is complete please bring a copy in to our office so we can scan it into your chart.  Conditions/risks identified: none   Next appointment: Follow up in one year for your annual wellness visit   Preventive Care 65 Years and Older, Male Preventive care refers to lifestyle choices and visits with your health care provider that can promote health and wellness. What does preventive care include?  A yearly physical exam. This is also called an annual well check.  Dental exams once or twice a year.  Routine eye exams. Ask your health care provider how often you should have your eyes checked.  Personal lifestyle choices, including:  Daily care of your teeth and gums.  Regular physical activity.  Eating a healthy diet.  Avoiding tobacco and drug use.  Limiting alcohol use.  Practicing safe sex.  Taking low doses of aspirin every day.  Taking vitamin and mineral supplements as recommended by your health care provider. What happens during an annual well check? The services and screenings done by your health care provider during your annual well check will depend on your age, overall health, lifestyle risk  factors, and family history of disease. Counseling  Your health care provider may ask you questions about your:  Alcohol use.  Tobacco use.  Drug use.  Emotional well-being.  Home and relationship well-being.  Sexual activity.  Eating habits.  History of falls.  Memory and ability to understand (cognition).  Work and work Statistician. Screening  You may have the following tests or measurements:  Height, weight, and BMI.  Blood pressure.  Lipid and cholesterol levels. These may be checked every 5 years, or more frequently if you are over 90 years old.  Skin check.  Lung cancer screening. You may have this screening every year starting at age 74 if you have a 30-pack-year history of smoking and currently smoke or have quit within the past 15 years.  Fecal occult blood test (FOBT) of the stool. You may have this test every year starting at age 31.  Flexible sigmoidoscopy or colonoscopy. You may have a sigmoidoscopy every 5 years or a colonoscopy every 10 years starting at age 35.  Prostate cancer screening. Recommendations will vary depending on your family history and other risks.  Hepatitis C blood test.  Hepatitis B blood test.  Sexually transmitted disease (STD) testing.  Diabetes screening. This is done by checking your blood sugar (glucose) after you have not eaten for a while (fasting). You may have this done every 1-3 years.  Abdominal aortic aneurysm (AAA) screening. You may need this if you are a current or former smoker.  Osteoporosis. You may be screened starting at age 62 if you are at high risk. Talk with your health care provider about your  test results, treatment options, and if necessary, the need for more tests. Vaccines  Your health care provider may recommend certain vaccines, such as:  Influenza vaccine. This is recommended every year.  Tetanus, diphtheria, and acellular pertussis (Tdap, Td) vaccine. You may need a Td booster every 10  years.  Zoster vaccine. You may need this after age 33.  Pneumococcal 13-valent conjugate (PCV13) vaccine. One dose is recommended after age 60.  Pneumococcal polysaccharide (PPSV23) vaccine. One dose is recommended after age 61. Talk to your health care provider about which screenings and vaccines you need and how often you need them. This information is not intended to replace advice given to you by your health care provider. Make sure you discuss any questions you have with your health care provider. Document Released: 07/13/2015 Document Revised: 03/05/2016 Document Reviewed: 04/17/2015 Elsevier Interactive Patient Education  2017 [place] Prevention in the Home Falls can cause injuries. They can happen to people of all ages. There are many things you can do to make your home safe and to help prevent falls. What can I do on the outside of my home?  Regularly fix the edges of walkways and driveways and fix any cracks.  Remove anything that might make you trip as you walk through a door, such as a raised step or threshold.  Trim any bushes or trees on the path to your home.  Use bright outdoor lighting.  Clear any walking paths of anything that might make someone trip, such as rocks or tools.  Regularly check to see if handrails are loose or broken. Make sure that both sides of any steps have handrails.  Any raised decks and porches should have guardrails on the edges.  Have any leaves, snow, or ice cleared regularly.  Use sand or salt on walking paths during winter.  Clean up any spills in your garage right away. This includes oil or grease spills. What can I do in the bathroom?  Use night lights.  Install grab bars by the toilet and in the tub and shower. Do not use towel bars as grab bars.  Use non-skid mats or decals in the tub or shower.  If you need to sit down in the shower, use a plastic, non-slip stool.  Keep the floor dry. Clean up any water that  spills on the floor as soon as it happens.  Remove soap buildup in the tub or shower regularly.  Attach bath mats securely with double-sided non-slip rug tape.  Do not have throw rugs and other things on the floor that can make you trip. What can I do in the bedroom?  Use night lights.  Make sure that you have a light by your bed that is easy to reach.  Do not use any sheets or blankets that are too big for your bed. They should not hang down onto the floor.  Have a firm chair that has side arms. You can use this for support while you get dressed.  Do not have throw rugs and other things on the floor that can make you trip. What can I do in the kitchen?  Clean up any spills right away.  Avoid walking on wet floors.  Keep items that you use a lot in easy-to-reach places.  If you need to reach something above you, use a strong step stool that has a grab bar.  Keep electrical cords out of the way.  Do not use floor polish or wax that  makes floors slippery. If you must use wax, use non-skid floor wax.  Do not have throw rugs and other things on the floor that can make you trip. What can I do with my stairs?  Do not leave any items on the stairs.  Make sure that there are handrails on both sides of the stairs and use them. Fix handrails that are broken or loose. Make sure that handrails are as long as the stairways.  Check any carpeting to make sure that it is firmly attached to the stairs. Fix any carpet that is loose or worn.  Avoid having throw rugs at the top or bottom of the stairs. If you do have throw rugs, attach them to the floor with carpet tape.  Make sure that you have a light switch at the top of the stairs and the bottom of the stairs. If you do not have them, ask someone to add them for you. What else can I do to help prevent falls?  Wear shoes that:  Do not have high heels.  Have rubber bottoms.  Are comfortable and fit you well.  Are closed at the  toe. Do not wear sandals.  If you use a stepladder:  Make sure that it is fully opened. Do not climb a closed stepladder.  Make sure that both sides of the stepladder are locked into place.  Ask someone to hold it for you, if possible.  Clearly mark and make sure that you can see:  Any grab bars or handrails.  First and last steps.  Where the edge of each step is.  Use tools that help you move around (mobility aids) if they are needed. These include:  Canes.  Walkers.  Scooters.  Crutches.  Turn on the lights when you go into a dark area. Replace any light bulbs as soon as they burn out.  Set up your furniture so you have a clear path. Avoid moving your furniture around.  If any of your floors are uneven, fix them.  If there are any pets around you, be aware of where they are.  Review your medicines with your doctor. Some medicines can make you feel dizzy. This can increase your chance of falling. Ask your doctor what other things that you can do to help prevent falls. This information is not intended to replace advice given to you by your health care provider. Make sure you discuss any questions you have with your health care provider. Document Released: 04/12/2009 Document Revised: 11/22/2015 Document Reviewed: 07/21/2014 Elsevier Interactive Patient Education  2017 [employer].

## 2020-02-07 ENCOUNTER — Encounter: Payer: Self-pay | Admitting: Internal Medicine

## 2020-02-07 ENCOUNTER — Ambulatory Visit (INDEPENDENT_AMBULATORY_CARE_PROVIDER_SITE_OTHER): Payer: Medicare Other | Admitting: Nurse Practitioner

## 2020-02-07 ENCOUNTER — Other Ambulatory Visit: Payer: Self-pay

## 2020-02-07 ENCOUNTER — Other Ambulatory Visit: Payer: Self-pay | Admitting: *Deleted

## 2020-02-07 ENCOUNTER — Encounter: Payer: Self-pay | Admitting: Nurse Practitioner

## 2020-02-07 ENCOUNTER — Inpatient Hospital Stay: Payer: Medicare Other | Attending: Internal Medicine

## 2020-02-07 ENCOUNTER — Inpatient Hospital Stay (HOSPITAL_BASED_OUTPATIENT_CLINIC_OR_DEPARTMENT_OTHER): Payer: Medicare Other | Admitting: Internal Medicine

## 2020-02-07 VITALS — BP 134/68 | HR 64 | Temp 98.1°F | Ht 66.0 in | Wt 150.8 lb

## 2020-02-07 DIAGNOSIS — I10 Essential (primary) hypertension: Secondary | ICD-10-CM

## 2020-02-07 DIAGNOSIS — D509 Iron deficiency anemia, unspecified: Secondary | ICD-10-CM | POA: Insufficient documentation

## 2020-02-07 DIAGNOSIS — I129 Hypertensive chronic kidney disease with stage 1 through stage 4 chronic kidney disease, or unspecified chronic kidney disease: Secondary | ICD-10-CM | POA: Insufficient documentation

## 2020-02-07 DIAGNOSIS — D631 Anemia in chronic kidney disease: Secondary | ICD-10-CM

## 2020-02-07 DIAGNOSIS — G4700 Insomnia, unspecified: Secondary | ICD-10-CM | POA: Diagnosis not present

## 2020-02-07 DIAGNOSIS — E782 Mixed hyperlipidemia: Secondary | ICD-10-CM | POA: Diagnosis not present

## 2020-02-07 DIAGNOSIS — N1831 Chronic kidney disease, stage 3a: Secondary | ICD-10-CM | POA: Diagnosis not present

## 2020-02-07 DIAGNOSIS — N182 Chronic kidney disease, stage 2 (mild): Secondary | ICD-10-CM

## 2020-02-07 DIAGNOSIS — Z7982 Long term (current) use of aspirin: Secondary | ICD-10-CM | POA: Insufficient documentation

## 2020-02-07 DIAGNOSIS — R7301 Impaired fasting glucose: Secondary | ICD-10-CM

## 2020-02-07 DIAGNOSIS — E079 Disorder of thyroid, unspecified: Secondary | ICD-10-CM | POA: Insufficient documentation

## 2020-02-07 DIAGNOSIS — Z87891 Personal history of nicotine dependence: Secondary | ICD-10-CM | POA: Insufficient documentation

## 2020-02-07 DIAGNOSIS — Z0000 Encounter for general adult medical examination without abnormal findings: Secondary | ICD-10-CM | POA: Diagnosis not present

## 2020-02-07 DIAGNOSIS — R7989 Other specified abnormal findings of blood chemistry: Secondary | ICD-10-CM | POA: Diagnosis not present

## 2020-02-07 DIAGNOSIS — E785 Hyperlipidemia, unspecified: Secondary | ICD-10-CM | POA: Insufficient documentation

## 2020-02-07 DIAGNOSIS — N183 Chronic kidney disease, stage 3 unspecified: Secondary | ICD-10-CM | POA: Insufficient documentation

## 2020-02-07 DIAGNOSIS — Z79899 Other long term (current) drug therapy: Secondary | ICD-10-CM | POA: Diagnosis not present

## 2020-02-07 LAB — IRON AND TIBC
Iron: 89 ug/dL (ref 45–182)
Saturation Ratios: 27 % (ref 17.9–39.5)
TIBC: 333 ug/dL (ref 250–450)
UIBC: 244 ug/dL

## 2020-02-07 LAB — CBC WITH DIFFERENTIAL/PLATELET
Abs Immature Granulocytes: 0.02 10*3/uL (ref 0.00–0.07)
Basophils Absolute: 0.1 10*3/uL (ref 0.0–0.1)
Basophils Relative: 1 %
Eosinophils Absolute: 0.2 10*3/uL (ref 0.0–0.5)
Eosinophils Relative: 3 %
HCT: 36.8 % — ABNORMAL LOW (ref 39.0–52.0)
Hemoglobin: 12.8 g/dL — ABNORMAL LOW (ref 13.0–17.0)
Immature Granulocytes: 0 %
Lymphocytes Relative: 30 %
Lymphs Abs: 2 10*3/uL (ref 0.7–4.0)
MCH: 28.6 pg (ref 26.0–34.0)
MCHC: 34.8 g/dL (ref 30.0–36.0)
MCV: 82.1 fL (ref 80.0–100.0)
Monocytes Absolute: 0.6 10*3/uL (ref 0.1–1.0)
Monocytes Relative: 10 %
Neutro Abs: 3.8 10*3/uL (ref 1.7–7.7)
Neutrophils Relative %: 56 %
Platelets: 262 10*3/uL (ref 150–400)
RBC: 4.48 MIL/uL (ref 4.22–5.81)
RDW: 13.4 % (ref 11.5–15.5)
WBC: 6.7 10*3/uL (ref 4.0–10.5)
nRBC: 0 % (ref 0.0–0.2)

## 2020-02-07 LAB — BASIC METABOLIC PANEL
Anion gap: 9 (ref 5–15)
BUN: 26 mg/dL — ABNORMAL HIGH (ref 8–23)
CO2: 26 mmol/L (ref 22–32)
Calcium: 9 mg/dL (ref 8.9–10.3)
Chloride: 102 mmol/L (ref 98–111)
Creatinine, Ser: 1.29 mg/dL — ABNORMAL HIGH (ref 0.61–1.24)
GFR calc Af Amer: >60 mL/min (ref >60)
GFR calc non Af Amer: 53 mL/min — ABNORMAL LOW (ref >60)
Glucose, Bld: 115 mg/dL — ABNORMAL HIGH (ref 70–99)
Potassium: 4.4 mmol/L (ref 3.5–5.1)
Sodium: 137 mmol/L (ref 135–145)

## 2020-02-07 LAB — MICROALBUMIN, URINE WAIVED
Creatinine, Urine Waived: 10 mg/dL (ref 10–300)
Microalb, Ur Waived: 30 mg/L — ABNORMAL HIGH (ref 0–19)

## 2020-02-07 LAB — FERRITIN: Ferritin: 45 ng/mL (ref 24–336)

## 2020-02-07 MED ORDER — LISINOPRIL 20 MG PO TABS: 20.0000 mg | ORAL_TABLET | Freq: Every day | ORAL | 4 refills | Status: DC

## 2020-02-07 MED ORDER — ATORVASTATIN CALCIUM 40 MG PO TABS: ORAL_TABLET | ORAL | 4 refills | Status: DC

## 2020-02-07 MED ORDER — AMLODIPINE BESYLATE 5 MG PO TABS: 5.0000 mg | ORAL_TABLET | Freq: Every day | ORAL | 4 refills | Status: DC

## 2020-02-07 NOTE — Progress Notes (Signed)
[facility] CONSULT NOTE  Patient Care Team: Venita Lick, NP as PCP - General (Nurse Practitioner) Anthonette Legato, MD (Nephrology) Cammie Sickle, MD as Consulting Physician (Internal Medicine)  CHIEF COMPLAINTS/PURPOSE OF CONSULTATION: Anemia  # ANEMIA/ CKD-II; last colo- 3 years [Dr.Elliot]; awaiting re-eval.   # CKD- stage II-III [Dr.Kolluru]  Oncology History   No history exists.   HISTORY OF PRESENTING ILLNESS:   Roger Byrd 79 y.o.  male with a history of mild CKD stage II-III and also iron deficient anemia-is here for follow-up.  Patient continues to be physically active playing golf.   Denies any worsening nausea vomiting abdominal pain.  Denies any weight loss.  No blood in stools or black-colored stools.  Review of Systems  Constitutional: Negative for chills, diaphoresis, fever, malaise/fatigue and weight loss.  HENT: Negative for nosebleeds and sore throat.   Eyes: Negative for double vision.  Respiratory: Negative for cough, hemoptysis, sputum production, shortness of breath and wheezing.   Cardiovascular: Negative for chest pain, palpitations, orthopnea and leg swelling.  Gastrointestinal: Negative for abdominal pain, blood in stool, constipation, diarrhea, heartburn, melena, nausea and vomiting.  Genitourinary: Negative for dysuria, frequency and urgency.  Musculoskeletal: Negative for back pain and joint pain.  Skin: Negative.  Negative for itching and rash.  Neurological: Negative for dizziness, tingling, focal weakness, weakness and headaches.  Endo/Heme/Allergies: Does not bruise/bleed easily.  Psychiatric/Behavioral: Negative for depression. The patient is not nervous/anxious and does not have insomnia.      MEDICAL HISTORY:  Past Medical History:  Diagnosis Date  . Anxiety   . Chronic kidney disease   . Hyperlipidemia   . Hypertension   . Insomnia   . Thyroid disease     SURGICAL HISTORY: Past Surgical History:   Procedure Laterality Date  . BACK SURGERY     x2  . CARPAL TUNNEL RELEASE Right 03/08/2018   Procedure: CARPAL TUNNEL RELEASE;  Surgeon: Earnestine Leys, MD;  Location: [facility] ORS;  Service: Orthopedics;  Laterality: Right;  . COLONOSCOPY WITH PROPOFOL N/A 07/19/2015   Procedure: COLONOSCOPY WITH PROPOFOL;  Surgeon: Manya Silvas, MD;  Location: [facility] ENDOSCOPY;  Service: Endoscopy;  Laterality: N/A;    SOCIAL HISTORY: Social History   Socioeconomic History  . Marital status: Widowed    Spouse name: Not on file  . Number of children: Not on file  . Years of education: 38  . Highest education level: 12th grade  Occupational History  . Occupation: retired   Tobacco Use  . Smoking status: Former Smoker    Quit date: 08/08/1970    Years since quitting: 49.5  . Smokeless tobacco: Never Used  Vaping Use  . Vaping Use: Never used  Substance and Sexual Activity  . Alcohol use: Yes    Alcohol/week: 14.0 standard drinks    Types: 14 Glasses of wine per week    Comment: 6 oz glasses/ red wine   . Drug use: No  . Sexual activity: Not Currently  Other Topics Concern  . Not on file  Social History Narrative  . Not on file   Social Determinants of Health   Financial Resource Strain: Low Risk   . Difficulty of Paying Living Expenses: Not hard at all  Food Insecurity: No Food Insecurity  . Worried About Charity fundraiser in the Last Year: Never true  . Ran Out of Food in the Last Year: Never true  Transportation Needs:   . Lack of Transportation (Medical):   Marland Kitchen  Lack of Transportation (Non-Medical):   Physical Activity:   . Days of Exercise per Week:   . Minutes of Exercise per Session:   Stress:   . Feeling of Stress :   Social Connections: Moderately Integrated  . Frequency of Communication with Friends and Family: More than three times a week  . Frequency of Social Gatherings with Friends and Family: More than three times a week  . Attends Religious Services: More than 4 times  per year  . Active Member of Clubs or Organizations: Yes  . Attends Archivist Meetings: More than 4 times per year  . Marital Status: Widowed  Intimate Partner Violence:   . Fear of Current or Ex-Partner:   . Emotionally Abused:   Marland Kitchen Physically Abused:   . Sexually Abused:     FAMILY HISTORY: Family History  Problem Relation Age of Onset  . Hypertension Mother   . Heart disease Mother        heart attack  . Hypertension Sister   . Emphysema Father   . Hypertension Daughter   . Hypertension Son   . Hypertension Sister   . Hypertension Sister   . Emphysema Sister     ALLERGIES:  has No Known Allergies.  MEDICATIONS:  Current Outpatient Medications  Medication Sig Dispense Refill  . amLODipine (NORVASC) 5 MG tablet Take 1 tablet (5 mg total) by mouth daily. 90 tablet 4  . aspirin 81 MG tablet Take 81 mg by mouth every evening.     Marland Kitchen atorvastatin (LIPITOR) 40 MG tablet TAKE 1 TABLET BY MOUTH DAILY AT 6 PM. 90 tablet 4  . lisinopril (ZESTRIL) 20 MG tablet Take 1 tablet (20 mg total) by mouth daily. 90 tablet 4   No current facility-administered medications for this visit.      Marland Kitchen  PHYSICAL EXAMINATION: ECOG PERFORMANCE STATUS: 0 - Asymptomatic  Vitals:   02/07/20 1455  BP: (!) 149/65  Pulse: 65  Resp: 16  Temp: 97.6 F (36.4 C)  SpO2: 100%   Filed Weights   02/07/20 1455  Weight: 151 lb 9.6 oz (68.8 kg)    Physical Exam HENT:     Head: Normocephalic and atraumatic.     Mouth/Throat:     Pharynx: No oropharyngeal exudate.  Eyes:     Pupils: Pupils are equal, round, and reactive to light.  Cardiovascular:     Rate and Rhythm: Normal rate and regular rhythm.  Pulmonary:     Effort: No respiratory distress.     Breath sounds: No wheezing.  Abdominal:     General: Bowel sounds are normal. There is no distension.     Palpations: Abdomen is soft. There is no mass.     Tenderness: There is no abdominal tenderness. There is no guarding or rebound.   Musculoskeletal:        General: No tenderness. Normal range of motion.     Cervical back: Normal range of motion and neck supple.  Skin:    General: Skin is warm.  Neurological:     Mental Status: He is alert and oriented to person, place, and time.  Psychiatric:        Mood and Affect: Affect normal.      LABORATORY DATA:  I have reviewed the data as listed Lab Results  Component Value Date   WBC 6.7 02/07/2020   HGB 12.8 (L) 02/07/2020   HCT 36.8 (L) 02/07/2020   MCV 82.1 02/07/2020   PLT 262 02/07/2020  Recent Labs    02/08/19 1339 08/05/19 1420 02/07/20 1439  NA 139 139 137  K 4.3 4.3 4.4  CL 103 104 102  CO2 26 18* 26  GLUCOSE 135* 91 115*  BUN 28* 22 26*  CREATININE 1.35* 1.42* 1.29*  CALCIUM 9.3 9.2 9.0  GFRNONAA 50* 47* 53*  GFRAA 58* 54* >60  PROT  --  6.6  --   ALBUMIN  --  4.4  --   AST  --  20  --   ALT  --  15  --   ALKPHOS  --  71  --   BILITOT  --  0.4  --     RADIOGRAPHIC STUDIES: I have personally reviewed the radiological images as listed and agreed with the findings in the report. No results found.  ASSESSMENT & PLAN:   Anemia due to stage 3a chronic kidney disease # Iron deficiency/anemia/ CKD.  Hemoglobin 12.3; RECOMMEND vitron c [PO iron constipates]; discussed compliance with PO iron.   # CKD stage III-creatnine 1.3; STABLE.   # DISPOSITION:  # Follow up in 12 months-MD/labs- cbc/bmp-/iron studies/ferritin- Dr.B  All questions were answered. The patient knows to call the clinic with any problems, questions or concerns.    Cammie Sickle, MD 02/07/2020 3:14 PM

## 2020-02-07 NOTE — Assessment & Plan Note (Signed)
Check CMP today -- monitor glucose.

## 2020-02-07 NOTE — Assessment & Plan Note (Signed)
Ongoing, currently followed by hematology and nephrology.  Continue current regimen and collaborations.

## 2020-02-07 NOTE — Assessment & Plan Note (Signed)
Chronic, ongoing, followed by nephrology.  Continue this collaboration and current medication regimen, including Lisinopril for kidney protection.  CMP and urine ALB today.  Return in 6 months.

## 2020-02-07 NOTE — Progress Notes (Signed)
BP 134/68 (BP Location: Left Arm)   Pulse 64   Temp 98.1 F (36.7 C) (Oral)   Ht 5\' 6"  (1.676 m)   Wt 150 lb 12.8 oz (68.4 kg)   SpO2 98%   BMI 24.34 kg/m    Subjective:    Patient ID: Roger Byrd, male    DOB: 24-Aug-1940, 79 y.o.   MRN: [id number]  HPI: Roger Byrd is a 79 y.o. male presenting on 02/07/2020 for comprehensive medical examination. Current medical complaints include:none  He currently lives with: son Interim Problems from his last visit: no   HYPERTENSION / HYPERLIPIDEMIA Continues on Amlodipine, ASA, Lisinopril, and Atorvastatin. Satisfied with current treatment? yes Duration of hypertension: chronic BP monitoring frequency: daily BP range: 126/65 yesterday -- 120-130/60-70 range average BP medication side effects: no Duration of hyperlipidemia: chronic Cholesterol medication side effects: no Cholesterol supplements: none Medication compliance: good compliance Aspirin: yes Recent stressors: no Recurrent headaches: no Visual changes: no Palpitations: no Dyspnea: no Chest pain: no Lower extremity edema: no Dizzy/lightheaded: no   CHRONIC KIDNEY DISEASE Followed by nephrology, sees them again on the 18th.  Sees every 6 months.  Last February labs -- CRT 1.42 and GFR 47.  Saw hematology for anemia with CKD, is taking occasional oral iron. CKD status: stable Medications renally dose: yes Previous renal evaluation: yes Pneumovax:  Up to Date Influenza Vaccine:  Up to Date  Functional Status Survey: Is the patient deaf or have difficulty hearing?: No Does the patient have difficulty seeing, even when wearing glasses/contacts?: No Does the patient have difficulty concentrating, remembering, or making decisions?: No Does the patient have difficulty walking or climbing stairs?: No Does the patient have difficulty dressing or bathing?: No Does the patient have difficulty doing errands alone such as visiting a doctor's office or shopping?: No  FALL  RISK: Fall Risk  02/07/2020 11/14/2019 01/27/2019 02/18/2018 05/28/2017  Falls in the past year? 0 0 0 No No  Number falls in past yr: 0 0 - - -  Injury with Fall? 0 0 - - -  Follow up Falls evaluation completed - Falls evaluation completed - -    Depression Screen Depression screen [facility] 2/9 02/07/2020 11/14/2019 08/18/2019 05/28/2017 05/27/2016  Decreased Interest 0 0 0 0 0  Down, Depressed, Hopeless 0 0 0 0 0  PHQ - 2 Score 0 0 0 0 0  Altered sleeping - - - 0 0  Tired, decreased energy - - - 0 0  Change in appetite - - - 0 0  Feeling bad or failure about yourself  - - - 0 0  Trouble concentrating - - - 0 0  Moving slowly or fidgety/restless - - - 0 0  Suicidal thoughts - - - 0 0  PHQ-9 Score - - - 0 0  Difficult doing work/chores - - - Not difficult at all -    Advanced Directives <no information>  Past Medical History:  Past Medical History:  Diagnosis Date  . Anxiety   . Chronic kidney disease   . Hyperlipidemia   . Hypertension   . Insomnia   . Thyroid disease     Surgical History:  Past Surgical History:  Procedure Laterality Date  . BACK SURGERY     x2  . CARPAL TUNNEL RELEASE Right 03/08/2018   Procedure: CARPAL TUNNEL RELEASE;  Surgeon: Earnestine Leys, MD;  Location: [facility] ORS;  Service: Orthopedics;  Laterality: Right;  . COLONOSCOPY WITH PROPOFOL N/A 07/19/2015   Procedure:  COLONOSCOPY WITH PROPOFOL;  Surgeon: Manya Silvas, MD;  Location: [facility] ENDOSCOPY;  Service: Endoscopy;  Laterality: N/A;    Medications:  Current Outpatient Medications on File Prior to Visit  Medication Sig  . aspirin 81 MG tablet Take 81 mg by mouth every evening.    No current facility-administered medications on file prior to visit.    Allergies:  No Known Allergies  Social History:  Social History   Socioeconomic History  . Marital status: Widowed    Spouse name: Not on file  . Number of children: Not on file  . Years of education: 17  . Highest education level: 12th  grade  Occupational History  . Occupation: retired   Tobacco Use  . Smoking status: Former Smoker    Quit date: 08/08/1970    Years since quitting: 49.5  . Smokeless tobacco: Never Used  Vaping Use  . Vaping Use: Never used  Substance and Sexual Activity  . Alcohol use: Yes    Alcohol/week: 14.0 standard drinks    Types: 14 Glasses of wine per week    Comment: 6 oz glasses/ red wine   . Drug use: No  . Sexual activity: Not Currently  Other Topics Concern  . Not on file  Social History Narrative  . Not on file   Social Determinants of Health   Financial Resource Strain: Low Risk   . Difficulty of Paying Living Expenses: Not hard at all  Food Insecurity: No Food Insecurity  . Worried About Charity fundraiser in the Last Year: Never true  . Ran Out of Food in the Last Year: Never true  Transportation Needs:   . Lack of Transportation (Medical):   Marland Kitchen Lack of Transportation (Non-Medical):   Physical Activity:   . Days of Exercise per Week:   . Minutes of Exercise per Session:   Stress:   . Feeling of Stress :   Social Connections: Moderately Integrated  . Frequency of Communication with Friends and Family: More than three times a week  . Frequency of Social Gatherings with Friends and Family: More than three times a week  . Attends Religious Services: More than 4 times per year  . Active Member of Clubs or Organizations: Yes  . Attends Archivist Meetings: More than 4 times per year  . Marital Status: Widowed  Intimate Partner Violence:   . Fear of Current or Ex-Partner:   . Emotionally Abused:   Marland Kitchen Physically Abused:   . Sexually Abused:    Social History   Tobacco Use  Smoking Status Former Smoker  . Quit date: 08/08/1970  . Years since quitting: 49.5  Smokeless Tobacco Never Used   Social History   Substance and Sexual Activity  Alcohol Use Yes  . Alcohol/week: 14.0 standard drinks  . Types: 14 Glasses of wine per week   Comment: 6 oz glasses/ red  wine     Family History:  Family History  Problem Relation Age of Onset  . Hypertension Mother   . Heart disease Mother        heart attack  . Hypertension Sister   . Emphysema Father   . Hypertension Daughter   . Hypertension Son   . Hypertension Sister   . Hypertension Sister   . Emphysema Sister     Past medical history, surgical history, medications, allergies, family history and social history reviewed with patient today and changes made to appropriate areas of the chart.   Review of Systems -  negative All other ROS negative except what is listed above and in the HPI.      Objective:    BP 134/68 (BP Location: Left Arm)   Pulse 64   Temp 98.1 F (36.7 C) (Oral)   Ht 5\' 6"  (1.676 m)   Wt 150 lb 12.8 oz (68.4 kg)   SpO2 98%   BMI 24.34 kg/m   Wt Readings from Last 3 Encounters:  02/07/20 150 lb 12.8 oz (68.4 kg)  11/14/19 153 lb (69.4 kg)  08/18/19 151 lb (68.5 kg)    Physical Exam Vitals and nursing note reviewed.  Constitutional:      General: He is awake. He is not in acute distress.    Appearance: He is well-developed and well-groomed. He is not ill-appearing.  HENT:     Head: Normocephalic and atraumatic.     Right Ear: Hearing, tympanic membrane, ear canal and external ear normal. No drainage.     Left Ear: Hearing, tympanic membrane, ear canal and external ear normal. No drainage.     Nose: Nose normal.     Mouth/Throat:     Pharynx: Uvula midline.  Eyes:     General: Lids are normal.        Right eye: No discharge.        Left eye: No discharge.     Extraocular Movements: Extraocular movements intact.     Conjunctiva/sclera: Conjunctivae normal.     Pupils: Pupils are equal, round, and reactive to light.     Visual Fields: Right eye visual fields normal and left eye visual fields normal.  Neck:     Thyroid: No thyromegaly.     Vascular: No carotid bruit or JVD.     Trachea: Trachea normal.  Cardiovascular:     Rate and Rhythm: Normal rate  and regular rhythm.     Heart sounds: Normal heart sounds, S1 normal and S2 normal. No murmur heard.  No gallop.   Pulmonary:     Effort: Pulmonary effort is normal. No accessory muscle usage or respiratory distress.     Breath sounds: Normal breath sounds.  Abdominal:     General: Bowel sounds are normal.     Palpations: Abdomen is soft. There is no hepatomegaly or splenomegaly.     Tenderness: There is no abdominal tenderness.  Genitourinary:    Comments: Deferred per patient request Musculoskeletal:        General: Normal range of motion.     Cervical back: Normal range of motion and neck supple.     Right lower leg: No edema.     Left lower leg: No edema.  Lymphadenopathy:     Head:     Right side of head: No submental, submandibular, tonsillar, preauricular or posterior auricular adenopathy.     Left side of head: No submental, submandibular, tonsillar, preauricular or posterior auricular adenopathy.     Cervical: No cervical adenopathy.  Skin:    General: Skin is warm and dry.     Capillary Refill: Capillary refill takes less than 2 seconds.     Findings: No rash.  Neurological:     Mental Status: He is alert and oriented to person, place, and time.     Cranial Nerves: Cranial nerves are intact.     Gait: Gait is intact.     Deep Tendon Reflexes: Reflexes are normal and symmetric.     Reflex Scores:      Brachioradialis reflexes are 2+ on the right side  and 2+ on the left side.      Patellar reflexes are 2+ on the right side and 2+ on the left side. Psychiatric:        Attention and Perception: Attention normal.        Mood and Affect: Mood normal.        Speech: Speech normal.        Behavior: Behavior normal. Behavior is cooperative.        Thought Content: Thought content normal.        Cognition and Memory: Cognition normal.        Judgment: Judgment normal.      Results for orders placed or performed in visit on 08/09/19  Ferritin  Result Value Ref Range    Ferritin 30 24 - 336 ng/mL  Iron and TIBC  Result Value Ref Range   Iron 71 45 - 182 ug/dL   TIBC 361 250 - 450 ug/dL   Saturation Ratios 20 17.9 - 39.5 %   UIBC 290 ug/dL  CBC with Differential/Platelet  Result Value Ref Range   WBC 6.3 4.0 - 10.5 K/uL   RBC 4.61 4.22 - 5.81 MIL/uL   Hemoglobin 12.3 (L) 13.0 - 17.0 g/dL   HCT 38.4 (L) 39 - 52 %   MCV 83.3 80.0 - 100.0 fL   MCH 26.7 26.0 - 34.0 pg   MCHC 32.0 30.0 - 36.0 g/dL   RDW 15.0 11.5 - 15.5 %   Platelets 286 150 - 400 K/uL   nRBC 0.0 0.0 - 0.2 %   Neutrophils Relative % 53 %   Neutro Abs 3.3 1.7 - 7.7 K/uL   Lymphocytes Relative 34 %   Lymphs Abs 2.1 0.7 - 4.0 K/uL   Monocytes Relative 8 %   Monocytes Absolute 0.5 0 - 1 K/uL   Eosinophils Relative 4 %   Eosinophils Absolute 0.3 0 - 0 K/uL   Basophils Relative 1 %   Basophils Absolute 0.1 0 - 0 K/uL   Immature Granulocytes 0 %   Abs Immature Granulocytes 0.01 0.00 - 0.07 K/uL      Assessment & Plan:   Problem List Items Addressed This Visit      Cardiovascular and Mediastinum   Hypertension    Chronic, ongoing with initial BP elevated but repeat at goal for age + home BP at goal.  Continue current medication regimen and adjust as needed.  Recommend continue to monitor BP at home regularly and document for provider + focus on DASH diet.  CMP, TSH, CBC today.  Refills sent in.  Return in 6 months.      Relevant Medications   atorvastatin (LIPITOR) 40 MG tablet   lisinopril (ZESTRIL) 20 MG tablet   amLODipine (NORVASC) 5 MG tablet   Other Relevant Orders   Comprehensive metabolic panel   TSH   Microalbumin, Urine Waived     Endocrine   IFG (impaired fasting glucose)    Check CMP today -- monitor glucose.        Genitourinary   CKD (chronic kidney disease)    Chronic, ongoing, followed by nephrology.  Continue this collaboration and current medication regimen, including Lisinopril for kidney protection.  CMP and urine ALB today.  Return in 6 months.        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  Continue current regimen and collaborations.          Other   Hyperlipidemia    Chronic, ongoing.  Continue current medication regimen and adjust as needed.  Lipid panel today.      Relevant Medications   atorvastatin (LIPITOR) 40 MG tablet   lisinopril (ZESTRIL) 20 MG tablet   amLODipine (NORVASC) 5 MG tablet   Other Relevant Orders   Lipid Panel w/o Chol/HDL Ratio    Other Visit Diagnoses    Routine general medical examination at a health care facility    -  Primary   Annual labs today to include CBC, CMP, TSH, lipid      Discussed aspirin prophylaxis for myocardial infarction prevention and decision was made to continue ASA  LABORATORY TESTING:  Health maintenance labs ordered today as discussed above.   The natural history of prostate cancer and ongoing controversy regarding screening and potential treatment outcomes of prostate cancer has been discussed with the patient. The meaning of a false positive PSA and a false negative PSA has been discussed. He indicates understanding of the limitations of this screening test and wishes not to proceed with screening PSA testing.   IMMUNIZATIONS:   - Tdap: Tetanus vaccination status reviewed: refused. - Influenza: Up to date - Pneumovax: Up to date - Prevnar: Up to date - Zostavax vaccine: Up to date  SCREENING: - Colonoscopy: Up to date  Discussed with patient purpose of the colonoscopy is to detect colon cancer at curable precancerous or early stages   - AAA Screening: Not applicable  -Hearing Test: Not applicable  -Spirometry: Not applicable   PATIENT COUNSELING:    Sexuality: Discussed sexually transmitted diseases, partner selection, use of condoms, avoidance of unintended pregnancy  and contraceptive  alternatives.   Advised to avoid cigarette smoking.  I discussed with the patient that most people either abstain from alcohol or drink within safe limits (<=14/week and <=4 drinks/occasion for males, <=7/weeks and <= 3 drinks/occasion for females) and that the risk for alcohol disorders and other health effects rises proportionally with the number of drinks per week and how often a drinker exceeds daily limits.  Discussed cessation/primary prevention of drug use and availability of treatment for abuse.   Diet: Encouraged to adjust caloric intake to maintain  or achieve ideal body weight, to reduce intake of dietary saturated fat and total fat, to limit sodium intake by avoiding high sodium foods and not adding table salt, and to maintain adequate dietary potassium and calcium preferably from fresh fruits, vegetables, and low-fat dairy products.    stressed the importance of regular exercise  Injury prevention: Discussed safety belts, safety helmets, smoke detector, smoking near bedding or upholstery.   Dental health: Discussed importance of regular tooth brushing, flossing, and dental visits.   Follow up plan: NEXT PREVENTATIVE PHYSICAL DUE IN 1 YEAR. Return in about 6 months (around 08/09/2020) for HTN/HLD, CKD.

## 2020-02-07 NOTE — Patient Instructions (Signed)
DASH Eating Plan DASH stands for "Dietary Approaches to Stop Hypertension." The DASH eating plan is a healthy eating plan that has been shown to reduce high blood pressure (hypertension). It may also reduce your risk for type 2 diabetes, heart disease, and stroke. The DASH eating plan may also help with weight loss. What are tips for following this plan?  General guidelines  Avoid eating more than 2,300 mg (milligrams) of salt (sodium) a day. If you have hypertension, you may need to reduce your sodium intake to 1,500 mg a day.  Limit alcohol intake to no more than 1 drink a day for nonpregnant women and 2 drinks a day for men. One drink equals 12 oz of beer, 5 oz of wine, or 1 oz of hard liquor.  Work with your health care provider to maintain a healthy body weight or to lose weight. Ask what an ideal weight is for you.  Get at least 30 minutes of exercise that causes your heart to beat faster (aerobic exercise) most days of the week. Activities may include walking, swimming, or biking.  Work with your health care provider or diet and nutrition specialist (dietitian) to adjust your eating plan to your individual calorie needs. Reading food labels   Check food labels for the amount of sodium per serving. Choose foods with less than 5 percent of the Daily Value of sodium. Generally, foods with less than 300 mg of sodium per serving fit into this eating plan.  To find whole grains, look for the word "whole" as the first word in the ingredient list. Shopping  Buy products labeled as "low-sodium" or "no salt added."  Buy fresh foods. Avoid canned foods and premade or frozen meals. Cooking  Avoid adding salt when cooking. Use salt-free seasonings or herbs instead of table salt or sea salt. Check with your health care provider or pharmacist before using salt substitutes.  Do not fry foods. Cook foods using healthy methods such as baking, boiling, grilling, and broiling instead.  Cook with  heart-healthy oils, such as olive, canola, soybean, or sunflower oil. Meal planning  Eat a balanced diet that includes: ? 5 or more servings of fruits and vegetables each day. At each meal, try to fill half of your plate with fruits and vegetables. ? Up to 6-8 servings of whole grains each day. ? Less than 6 oz of lean meat, poultry, or fish each day. A 3-oz serving of meat is about the same size as a deck of cards. One egg equals 1 oz. ? 2 servings of low-fat dairy each day. ? A serving of nuts, seeds, or beans 5 times each week. ? Heart-healthy fats. Healthy fats called Omega-3 fatty acids are found in foods such as flaxseeds and coldwater fish, like sardines, salmon, and mackerel.  Limit how much you eat of the following: ? Canned or prepackaged foods. ? Food that is high in trans fat, such as fried foods. ? Food that is high in saturated fat, such as fatty meat. ? Sweets, desserts, sugary drinks, and other foods with added sugar. ? Full-fat dairy products.  Do not salt foods before eating.  Try to eat at least 2 vegetarian meals each week.  Eat more home-cooked food and less restaurant, buffet, and fast food.  When eating at a restaurant, ask that your food be prepared with less salt or no salt, if possible. What foods are recommended? The items listed may not be a complete list. Talk with your dietitian about   what dietary choices are best for you. Grains Whole-grain or whole-wheat bread. Whole-grain or whole-wheat pasta. Brown rice. Oatmeal. Quinoa. Bulgur. Whole-grain and low-sodium cereals. Pita bread. Low-fat, low-sodium crackers. Whole-wheat flour tortillas. Vegetables Fresh or frozen vegetables (raw, steamed, roasted, or grilled). Low-sodium or reduced-sodium tomato and vegetable juice. Low-sodium or reduced-sodium tomato sauce and tomato paste. Low-sodium or reduced-sodium canned vegetables. Fruits All fresh, dried, or frozen fruit. Canned fruit in natural juice (without  added sugar). Meat and other protein foods Skinless chicken or turkey. Ground chicken or turkey. Pork with fat trimmed off. Fish and seafood. Egg whites. Dried beans, peas, or lentils. Unsalted nuts, nut butters, and seeds. Unsalted canned beans. Lean cuts of beef with fat trimmed off. Low-sodium, lean deli meat. Dairy Low-fat (1%) or fat-free (skim) milk. Fat-free, low-fat, or reduced-fat cheeses. Nonfat, low-sodium ricotta or cottage cheese. Low-fat or nonfat yogurt. Low-fat, low-sodium cheese. Fats and oils Soft margarine without trans fats. Vegetable oil. Low-fat, reduced-fat, or light mayonnaise and salad dressings (reduced-sodium). Canola, safflower, olive, soybean, and sunflower oils. Avocado. Seasoning and other foods Herbs. Spices. Seasoning mixes without salt. Unsalted popcorn and pretzels. Fat-free sweets. What foods are not recommended? The items listed may not be a complete list. Talk with your dietitian about what dietary choices are best for you. Grains Baked goods made with fat, such as croissants, muffins, or some breads. Dry pasta or rice meal packs. Vegetables Creamed or fried vegetables. Vegetables in a cheese sauce. Regular canned vegetables (not low-sodium or reduced-sodium). Regular canned tomato sauce and paste (not low-sodium or reduced-sodium). Regular tomato and vegetable juice (not low-sodium or reduced-sodium). Pickles. Olives. Fruits Canned fruit in a light or heavy syrup. Fried fruit. Fruit in cream or butter sauce. Meat and other protein foods Fatty cuts of meat. Ribs. Fried meat. Bacon. Sausage. Bologna and other processed lunch meats. Salami. Fatback. Hotdogs. Bratwurst. Salted nuts and seeds. Canned beans with added salt. Canned or smoked fish. Whole eggs or egg yolks. Chicken or turkey with skin. Dairy Whole or 2% milk, cream, and half-and-half. Whole or full-fat cream cheese. Whole-fat or sweetened yogurt. Full-fat cheese. Nondairy creamers. Whipped toppings.  Processed cheese and cheese spreads. Fats and oils Butter. Stick margarine. Lard. Shortening. Ghee. Bacon fat. Tropical oils, such as coconut, palm kernel, or palm oil. Seasoning and other foods Salted popcorn and pretzels. Onion salt, garlic salt, seasoned salt, table salt, and sea salt. Worcestershire sauce. Tartar sauce. Barbecue sauce. Teriyaki sauce. Soy sauce, including reduced-sodium. Steak sauce. Canned and packaged gravies. Fish sauce. Oyster sauce. Cocktail sauce. Horseradish that you find on the shelf. Ketchup. Mustard. Meat flavorings and tenderizers. Bouillon cubes. Hot sauce and Tabasco sauce. Premade or packaged marinades. Premade or packaged taco seasonings. Relishes. Regular salad dressings. Where to find more information:  National Heart, Lung, and Blood Institute: www.nhlbi.nih.gov  American Heart Association: www.heart.org Summary  The DASH eating plan is a healthy eating plan that has been shown to reduce high blood pressure (hypertension). It may also reduce your risk for type 2 diabetes, heart disease, and stroke.  With the DASH eating plan, you should limit salt (sodium) intake to 2,300 mg a day. If you have hypertension, you may need to reduce your sodium intake to 1,500 mg a day.  When on the DASH eating plan, aim to eat more fresh fruits and vegetables, whole grains, lean proteins, low-fat dairy, and heart-healthy fats.  Work with your health care provider or diet and nutrition specialist (dietitian) to adjust your eating plan to your   individual calorie needs. This information is not intended to replace advice given to you by your health care provider. Make sure you discuss any questions you have with your health care provider. Document Revised: 05/29/2017 Document Reviewed: 06/09/2016 Elsevier Patient Education  2020 Elsevier Inc.  

## 2020-02-07 NOTE — Assessment & Plan Note (Signed)
Chronic, ongoing with initial BP elevated but repeat at goal for age + home BP at goal.  Continue current medication regimen and adjust as needed.  Recommend continue to monitor BP at home regularly and document for provider + focus on DASH diet.  CMP, TSH, CBC today.  Refills sent in.  Return in 6 months.

## 2020-02-07 NOTE — Assessment & Plan Note (Signed)
#   Iron deficiency/anemia/ CKD.  Hemoglobin 12.3; RECOMMEND vitron c [PO iron constipates]; discussed compliance with PO iron.   # CKD stage III-creatnine 1.3; STABLE.   # DISPOSITION:  # Follow up in 12 months-MD/labs- cbc/bmp-/iron studies/ferritin- Dr.B

## 2020-02-07 NOTE — Assessment & Plan Note (Signed)
Chronic, ongoing.  Continue current medication regimen and adjust as needed. Lipid panel today. 

## 2020-02-07 NOTE — Assessment & Plan Note (Deleted)
#   Iron deficiency/anemia/ CKD.  Hemoglobin 12.3; RECOMMEND vitron c [PO iron constipates]  # CKD stage III-creatnine 1.4-Stable.   # counselled re: vaccine. Awaiting with health couty dept.   # DISPOSITION: mychart report # Follow up in 6 months-MD/labs- cbc/bmp-/iron studies/ferritin- Dr.B

## 2020-02-08 ENCOUNTER — Encounter: Payer: Self-pay | Admitting: Nurse Practitioner

## 2020-02-08 ENCOUNTER — Other Ambulatory Visit: Payer: Self-pay | Admitting: Nurse Practitioner

## 2020-02-08 DIAGNOSIS — E039 Hypothyroidism, unspecified: Secondary | ICD-10-CM | POA: Insufficient documentation

## 2020-02-08 DIAGNOSIS — R7989 Other specified abnormal findings of blood chemistry: Secondary | ICD-10-CM

## 2020-02-08 LAB — CBC WITH DIFFERENTIAL/PLATELET
Basophils Absolute: 0.1 10*3/uL (ref 0.0–0.2)
Basos: 1 %
EOS (ABSOLUTE): 0.3 10*3/uL (ref 0.0–0.4)
Eos: 5 %
Hematocrit: 42 % (ref 37.5–51.0)
Hemoglobin: 13.7 g/dL (ref 13.0–17.7)
Immature Grans (Abs): 0 10*3/uL (ref 0.0–0.1)
Immature Granulocytes: 0 %
Lymphocytes Absolute: 2 10*3/uL (ref 0.7–3.1)
Lymphs: 32 %
MCH: 27.8 pg (ref 26.6–33.0)
MCHC: 32.6 g/dL (ref 31.5–35.7)
MCV: 85 fL (ref 79–97)
Monocytes Absolute: 0.6 10*3/uL (ref 0.1–0.9)
Monocytes: 9 %
Neutrophils Absolute: 3.4 10*3/uL (ref 1.4–7.0)
Neutrophils: 53 %
Platelets: 295 10*3/uL (ref 150–450)
RBC: 4.93 x10E6/uL (ref 4.14–5.80)
RDW: 12.9 % (ref 11.6–15.4)
WBC: 6.4 10*3/uL (ref 3.4–10.8)

## 2020-02-08 LAB — COMPREHENSIVE METABOLIC PANEL
ALT: 20 IU/L (ref 0–44)
AST: 21 IU/L (ref 0–40)
Albumin/Globulin Ratio: 1.9 (ref 1.2–2.2)
Albumin: 4.6 g/dL (ref 3.7–4.7)
Alkaline Phosphatase: 76 IU/L (ref 48–121)
BUN/Creatinine Ratio: 15 (ref 10–24)
BUN: 19 mg/dL (ref 8–27)
Bilirubin Total: 0.6 mg/dL (ref 0.0–1.2)
CO2: 22 mmol/L (ref 20–29)
Calcium: 9.5 mg/dL (ref 8.6–10.2)
Chloride: 99 mmol/L (ref 96–106)
Creatinine, Ser: 1.25 mg/dL (ref 0.76–1.27)
GFR calc Af Amer: 63 mL/min/{1.73_m2} (ref >59)
GFR calc non Af Amer: 55 mL/min/{1.73_m2} — ABNORMAL LOW (ref >59)
Globulin, Total: 2.4 g/dL (ref 1.5–4.5)
Glucose: 107 mg/dL — ABNORMAL HIGH (ref 65–99)
Potassium: 4.9 mmol/L (ref 3.5–5.2)
Sodium: 135 mmol/L (ref 134–144)
Total Protein: 7 g/dL (ref 6.0–8.5)

## 2020-02-08 LAB — LIPID PANEL W/O CHOL/HDL RATIO
Cholesterol, Total: 188 mg/dL (ref 100–199)
HDL: 68 mg/dL (ref >39)
LDL Chol Calc (NIH): 104 mg/dL — ABNORMAL HIGH (ref 0–99)
Triglycerides: 86 mg/dL (ref 0–149)
VLDL Cholesterol Cal: 16 mg/dL (ref 5–40)

## 2020-02-08 LAB — TSH: TSH: 4.75 u[IU]/mL — ABNORMAL HIGH (ref 0.450–4.500)

## 2020-02-08 NOTE — Addendum Note (Signed)
Addended by: Dorris Fetch on: 02/08/2020 02:30 PM   Modules accepted: Orders

## 2020-02-08 NOTE — Progress Notes (Signed)
Contacted via [facility] [place], your labs have returned.   - CBC shows no anemia - Kidney function remains stable with some mild kidney disease we will continue to monitor.  Ensure you drink plenty of water daily. - Continue your Atorvastatin as your LDL was slightly elevated above goal on these labs.  Make sure you take it daily. - TSH, thyroid lab, was slightly elevated.  I have added a Free T4 to further check this.  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  Will recheck next visit if the Free T4 returns normal. Any questions? Keep being awesome!! Kindest regards, Deuntae Kocsis

## 2020-02-09 LAB — T4, FREE: Free T4: 1.09 ng/dL (ref 0.82–1.77)

## 2020-02-09 LAB — SPECIMEN STATUS REPORT

## 2020-02-10 NOTE — Progress Notes (Signed)
Contacted via MyChart  Free T4 is normal.  No medications for thyroid at this time, we will continue to monitor levels at your visits.  Have a good day!!

## 2020-02-16 DIAGNOSIS — N1832 Chronic kidney disease, stage 3b: Secondary | ICD-10-CM | POA: Diagnosis not present

## 2020-02-16 DIAGNOSIS — I10 Essential (primary) hypertension: Secondary | ICD-10-CM | POA: Diagnosis not present

## 2020-02-16 DIAGNOSIS — D631 Anemia in chronic kidney disease: Secondary | ICD-10-CM | POA: Diagnosis not present

## 2020-02-16 DIAGNOSIS — N1831 Chronic kidney disease, stage 3a: Secondary | ICD-10-CM | POA: Diagnosis not present

## 2020-02-16 DIAGNOSIS — R809 Proteinuria, unspecified: Secondary | ICD-10-CM | POA: Diagnosis not present

## 2020-07-26 DIAGNOSIS — N1831 Chronic kidney disease, stage 3a: Secondary | ICD-10-CM | POA: Diagnosis not present

## 2020-07-26 DIAGNOSIS — I10 Essential (primary) hypertension: Secondary | ICD-10-CM | POA: Diagnosis not present

## 2020-07-26 DIAGNOSIS — R809 Proteinuria, unspecified: Secondary | ICD-10-CM | POA: Diagnosis not present

## 2020-07-26 DIAGNOSIS — D631 Anemia in chronic kidney disease: Secondary | ICD-10-CM | POA: Diagnosis not present

## 2020-08-09 ENCOUNTER — Ambulatory Visit (INDEPENDENT_AMBULATORY_CARE_PROVIDER_SITE_OTHER): Payer: Medicare Other | Admitting: Nurse Practitioner

## 2020-08-09 ENCOUNTER — Encounter: Payer: Self-pay | Admitting: Nurse Practitioner

## 2020-08-09 ENCOUNTER — Other Ambulatory Visit: Payer: Self-pay

## 2020-08-09 VITALS — BP 134/70 | HR 65 | Temp 98.1°F | Ht 66.34 in | Wt 149.2 lb

## 2020-08-09 DIAGNOSIS — D631 Anemia in chronic kidney disease: Secondary | ICD-10-CM

## 2020-08-09 DIAGNOSIS — Z23 Encounter for immunization: Secondary | ICD-10-CM | POA: Diagnosis not present

## 2020-08-09 DIAGNOSIS — E782 Mixed hyperlipidemia: Secondary | ICD-10-CM

## 2020-08-09 DIAGNOSIS — Z1159 Encounter for screening for other viral diseases: Secondary | ICD-10-CM

## 2020-08-09 DIAGNOSIS — N1831 Chronic kidney disease, stage 3a: Secondary | ICD-10-CM | POA: Diagnosis not present

## 2020-08-09 DIAGNOSIS — R7301 Impaired fasting glucose: Secondary | ICD-10-CM

## 2020-08-09 DIAGNOSIS — R7989 Other specified abnormal findings of blood chemistry: Secondary | ICD-10-CM

## 2020-08-09 DIAGNOSIS — T148XXA Other injury of unspecified body region, initial encounter: Secondary | ICD-10-CM | POA: Diagnosis not present

## 2020-08-09 DIAGNOSIS — I10 Essential (primary) hypertension: Secondary | ICD-10-CM | POA: Diagnosis not present

## 2020-08-09 NOTE — Assessment & Plan Note (Signed)
Stable with recent nephrology labs noting level of 89 -- monitor glucose.

## 2020-08-09 NOTE — Progress Notes (Signed)
BP 134/70   Pulse 65   Temp 98.1 F (36.7 C) (Oral)   Ht 5' 6.34" (1.685 m)   Wt 149 lb 3.2 oz (67.7 kg)   SpO2 98%   BMI 23.84 kg/m    Subjective:    Patient ID: Roger Byrd, male    DOB: 03-Jul-1940, 80 y.o.   MRN: [id number]  HPI: Roger Byrd is a 80 y.o. male  Chief Complaint  Patient presents with  . Hypertension  . Hyperlipidemia  . Chronic Kidney Disease   HYPERTENSION / HYPERLIPIDEMIA Continues on Amlodipine, ASA, Lisinopril, and Atorvastatin. Satisfied with current treatment? yes Duration of hypertension: chronic BP monitoring frequency: a few times a week BP range: 131/69 this morning -- 120-130/60-70 range average BP medication side effects: no Duration of hyperlipidemia: chronic Cholesterol medication side effects: no Cholesterol supplements: none Medication compliance: good compliance Aspirin: yes Recent stressors: no Recurrent headaches: no Visual changes: no Palpitations: no Dyspnea: no Chest pain: no Lower extremity edema: no Dizzy/lightheaded: no   CHRONIC KIDNEY DISEASE Followed by nephrology, last 07/26/20  Sees every 6 months.  Last January 2022 -- CRT 1.33 and GFR 50, PTH 49. Saw hematology for anemia with CKD, is taking occasional oral iron -- recent H/H 10.1/41.5, MCV 27.5 -- last saw hematology 02/07/20. CKD status: stable Medications renally dose: yes Previous renal evaluation: yes Pneumovax:  Up to Date Influenza Vaccine:  Up to Date  IFG Noted on past labs, recent 41 with nephrology -- improving.   Polydipsia/polyuria: no Visual disturbance: no Chest pain: no Paresthesias: no   ELEVATED TSH Recent August labs with TSH 4.750 and Free T4 1.09.  No current medications. Fatigue: no Cold intolerance: no Heat intolerance: no Weight gain: no Weight loss: no Constipation: occasional Diarrhea/loose stools: no Palpitations: no Lower extremity edema: no Anxiety/depressed mood: no  ABRASION Banged head against metal on  cabinet recently, area is healing.  Tetanus is not up to date. Duration: days Location: left side scalp Painful: no Itching: no Onset: sudden Context: not changing  Relevant past medical, surgical, family and social history reviewed and updated as indicated. Interim medical history since our last visit reviewed. Allergies and medications reviewed and updated.  Review of Systems  Constitutional: Negative for activity change, diaphoresis, fatigue and fever.  Respiratory: Negative for cough, chest tightness, shortness of breath and wheezing.   Cardiovascular: Negative for chest pain, palpitations and leg swelling.  Gastrointestinal: Negative.   Endocrine: Negative for cold intolerance, heat intolerance, polydipsia, polyphagia and polyuria.  Neurological: Negative.   Psychiatric/Behavioral: Negative.     Per HPI unless specifically indicated above     Objective:    BP 134/70   Pulse 65   Temp 98.1 F (36.7 C) (Oral)   Ht 5' 6.34" (1.685 m)   Wt 149 lb 3.2 oz (67.7 kg)   SpO2 98%   BMI 23.84 kg/m   Wt Readings from Last 3 Encounters:  08/09/20 149 lb 3.2 oz (67.7 kg)  02/07/20 151 lb 9.6 oz (68.8 kg)  02/07/20 150 lb 12.8 oz (68.4 kg)    Physical Exam Vitals and nursing note reviewed.  Constitutional:      General: He is awake. He is not in acute distress.    Appearance: He is well-developed and well-groomed. He is not ill-appearing.  HENT:     Head: Normocephalic and atraumatic.     Right Ear: Hearing normal. No drainage.     Left Ear: Hearing normal. No drainage.  Eyes:  General: Lids are normal.        Right eye: No discharge.        Left eye: No discharge.     Conjunctiva/sclera: Conjunctivae normal.     Pupils: Pupils are equal, round, and reactive to light.  Neck:     Thyroid: No thyromegaly.     Vascular: No carotid bruit.     Trachea: Trachea normal.  Cardiovascular:     Rate and Rhythm: Normal rate and regular rhythm.     Heart sounds: Normal heart  sounds, S1 normal and S2 normal. No murmur heard. No gallop.   Pulmonary:     Effort: Pulmonary effort is normal. No accessory muscle usage or respiratory distress.     Breath sounds: Normal breath sounds.  Abdominal:     General: Bowel sounds are normal.     Palpations: Abdomen is soft. There is no hepatomegaly or splenomegaly.  Musculoskeletal:        General: Normal range of motion.     Cervical back: Normal range of motion and neck supple.     Right lower leg: No edema.     Left lower leg: No edema.  Skin:    General: Skin is warm and dry.     Capillary Refill: Capillary refill takes less than 2 seconds.  Neurological:     Mental Status: He is alert and oriented to person, place, and time.     Deep Tendon Reflexes: Reflexes are normal and symmetric.     Reflex Scores:      Brachioradialis reflexes are 2+ on the right side and 2+ on the left side.      Patellar reflexes are 2+ on the right side and 2+ on the left side. Psychiatric:        Attention and Perception: Attention normal.        Mood and Affect: Mood normal.        Speech: Speech normal.        Behavior: Behavior normal. Behavior is cooperative.        Thought Content: Thought content normal.    Results for orders placed or performed in visit on 16/60/63  Basic metabolic panel  Result Value Ref Range   Sodium 137 135 - 145 mmol/L   Potassium 4.4 3.5 - 5.1 mmol/L   Chloride 102 98 - 111 mmol/L   CO2 26 22 - 32 mmol/L   Glucose, Bld 115 (H) 70 - 99 mg/dL   BUN 26 (H) 8 - 23 mg/dL   Creatinine, Ser 1.29 (H) 0.61 - 1.24 mg/dL   Calcium 9.0 8.9 - 10.3 mg/dL   GFR calc non Af Amer 53 (L) >60 mL/min   GFR calc Af Amer >60 >60 mL/min   Anion gap 9 5 - 15  CBC with Differential  Result Value Ref Range   WBC 6.7 4.0 - 10.5 K/uL   RBC 4.48 4.22 - 5.81 MIL/uL   Hemoglobin 12.8 (L) 13.0 - 17.0 g/dL   HCT 36.8 (L) 39.0 - 52.0 %   MCV 82.1 80.0 - 100.0 fL   MCH 28.6 26.0 - 34.0 pg   MCHC 34.8 30.0 - 36.0 g/dL   RDW  13.4 11.5 - 15.5 %   Platelets 262 150 - 400 K/uL   nRBC 0.0 0.0 - 0.2 %   Neutrophils Relative % 56 %   Neutro Abs 3.8 1.7 - 7.7 K/uL   Lymphocytes Relative 30 %   Lymphs Abs 2.0 0.7 -  4.0 K/uL   Monocytes Relative 10 %   Monocytes Absolute 0.6 0.1 - 1.0 K/uL   Eosinophils Relative 3 %   Eosinophils Absolute 0.2 0.0 - 0.5 K/uL   Basophils Relative 1 %   Basophils Absolute 0.1 0.0 - 0.1 K/uL   Immature Granulocytes 0 %   Abs Immature Granulocytes 0.02 0.00 - 0.07 K/uL  Ferritin  Result Value Ref Range   Ferritin 45 24 - 336 ng/mL  Iron and TIBC  Result Value Ref Range   Iron 89 45 - 182 ug/dL   TIBC 333 250 - 450 ug/dL   Saturation Ratios 27 17.9 - 39.5 %   UIBC 244 ug/dL      Assessment & Plan:   Problem List Items Addressed This Visit      Cardiovascular and Mediastinum   Hypertension    Chronic, ongoing with initial BP elevated but repeat at goal for age + home BP at goal.  Continue current medication regimen and adjust as needed.  Recommend continue to monitor BP at home regularly and document for provider + focus on DASH diet.  Kidney function labs up to date with nephrology recently.  Return in 6 months for physical.        Endocrine   IFG (impaired fasting glucose)    Stable with recent nephrology labs noting level of 89 -- monitor glucose.        Genitourinary   Anemia due to stage 3a chronic kidney disease (HCC)    Chronic, stable at this time.  Continue collaboration with hematology and nephrology + current medication regimen.        CKD (chronic kidney disease) - Primary    Chronic, ongoing, followed by nephrology.  Continue this collaboration and current medication regimen, including Lisinopril for kidney protection.   Kidney function labs up to date with nephrology recently.  Return in 6 months for physical.        Other   Hyperlipidemia    Chronic, ongoing.  Continue current medication regimen and adjust as needed.  Lipid panel today.       Relevant Orders   Lipid Panel w/o Chol/HDL Ratio   Elevated TSH    Noted on recent labs with no symptoms -- recheck labs today: TSH and Free T4.      Relevant Orders   TSH   T4, free    Other Visit Diagnoses    Abrasion       Will update tetanus today due to recent abrasion to scalp on metal.  Healing well.   Relevant Orders   Tdap vaccine greater than or equal to 7yo IM   Need for hepatitis C screening test       Hep C screening on labs today, discussed with patient.   Relevant Orders   Hepatitis C antibody       Follow up plan: Return in about 6 months (around 02/06/2021) for Annual physical due after 02/06/21.

## 2020-08-09 NOTE — Assessment & Plan Note (Signed)
Chronic, ongoing.  Continue current medication regimen and adjust as needed. Lipid panel today. 

## 2020-08-09 NOTE — Assessment & Plan Note (Signed)
Noted on recent labs with no symptoms -- recheck labs today: TSH and Free T4.

## 2020-08-09 NOTE — Assessment & Plan Note (Signed)
Chronic, stable at this time.  Continue collaboration with hematology and nephrology + current medication regimen.

## 2020-08-09 NOTE — Patient Instructions (Signed)

## 2020-08-09 NOTE — Assessment & Plan Note (Signed)
Chronic, ongoing with initial BP elevated but repeat at goal for age + home BP at goal.  Continue current medication regimen and adjust as needed.  Recommend continue to monitor BP at home regularly and document for provider + focus on DASH diet.  Kidney function labs up to date with nephrology recently.  Return in 6 months for physical.

## 2020-08-09 NOTE — Assessment & Plan Note (Addendum)
Chronic, ongoing, followed by nephrology.  Continue this collaboration and current medication regimen, including Lisinopril for kidney protection.   Kidney function labs up to date with nephrology recently.  Return in 6 months for physical.

## 2020-08-10 LAB — LIPID PANEL W/O CHOL/HDL RATIO
Cholesterol, Total: 192 mg/dL (ref 100–199)
HDL: 69 mg/dL (ref >39)
LDL Chol Calc (NIH): 89 mg/dL (ref 0–99)
Triglycerides: 203 mg/dL — ABNORMAL HIGH (ref 0–149)
VLDL Cholesterol Cal: 34 mg/dL (ref 5–40)

## 2020-08-10 LAB — T4, FREE: Free T4: 1.06 ng/dL (ref 0.82–1.77)

## 2020-08-10 LAB — TSH: TSH: 5.81 u[IU]/mL — ABNORMAL HIGH (ref 0.450–4.500)

## 2020-08-10 LAB — HEPATITIS C ANTIBODY: Hep C Virus Ab: <0.1 s/co ratio (ref 0.0–0.9)

## 2020-08-10 NOTE — Progress Notes (Signed)
Contacted via MyChart   Good morning Mr. Fritsche, your labs have returned and overall cholesterol levels look stable.  Would like to see triglycerides a little lower, focus on diet changes and continue Atorvastatin.  Your TSH is still a little elevated, however in your age group I do not start medicine until that TSH is >6 consistently, yours is not.  Plus your Free T4 is normal.  We will continue to monitor this closely.  Any questions? Keep being awesome!!  Thank you for allowing me to participate in your care. Kindest regards, Henrine Screws '

## 2020-08-30 DIAGNOSIS — Z961 Presence of intraocular lens: Secondary | ICD-10-CM | POA: Diagnosis not present

## 2020-11-15 ENCOUNTER — Telehealth: Payer: Self-pay | Admitting: Nurse Practitioner

## 2020-11-15 NOTE — Telephone Encounter (Signed)
Patient called to reschedule his AWV for today.  He would like it to be next week.  Please call patient to reschedule.

## 2020-11-15 NOTE — Telephone Encounter (Signed)
Reschedule

## 2020-11-19 ENCOUNTER — Ambulatory Visit: Payer: Medicare Other

## 2020-11-29 DIAGNOSIS — D631 Anemia in chronic kidney disease: Secondary | ICD-10-CM | POA: Diagnosis not present

## 2020-11-29 DIAGNOSIS — I10 Essential (primary) hypertension: Secondary | ICD-10-CM | POA: Diagnosis not present

## 2020-11-29 DIAGNOSIS — N1831 Chronic kidney disease, stage 3a: Secondary | ICD-10-CM | POA: Diagnosis not present

## 2020-11-29 DIAGNOSIS — R809 Proteinuria, unspecified: Secondary | ICD-10-CM | POA: Diagnosis not present

## 2020-12-03 ENCOUNTER — Ambulatory Visit (INDEPENDENT_AMBULATORY_CARE_PROVIDER_SITE_OTHER): Payer: Medicare Other

## 2020-12-03 VITALS — Ht 68.0 in | Wt 150.0 lb

## 2020-12-03 DIAGNOSIS — Z0000 Encounter for general adult medical examination without abnormal findings: Secondary | ICD-10-CM | POA: Diagnosis not present

## 2020-12-03 NOTE — Progress Notes (Signed)
I connected with Roger Byrd today by telephone and verified that I am speaking with the correct person using two identifiers. Location patient: home Location provider: work Persons participating in the virtual visit: Copeland Neisen, Glenna Durand LPN.   I discussed the limitations, risks, security and privacy concerns of performing an evaluation and management service by telephone and the availability of in person appointments. I also discussed with the patient that there may be a patient responsible charge related to this service. The patient expressed understanding and verbally consented to this telephonic visit.    Interactive audio and video telecommunications were attempted between this provider and patient, however failed, due to patient having technical difficulties OR patient did not have access to video capability.  We continued and completed visit with audio only.     Vital signs may be patient reported or missing.  Subjective:   Roger Byrd is a 80 y.o. male who presents for Medicare Annual/Subsequent preventive examination.  Review of Systems     Cardiac Risk Factors include: advanced age (>31men, >46 women);hypertension;male gender     Objective:    Today's Vitals   12/03/20 1511  Weight: 150 lb (68 kg)  Height: 5\' 8"  (1.727 m)   Body mass index is 22.81 kg/m.  Advanced Directives 12/03/2020 11/14/2019 02/08/2019 11/09/2018 08/10/2018 05/10/2018 03/04/2018  Does Patient Have a Medical Advance Directive? No No No Yes No No No  Type of Advance Directive - - - Living will;Healthcare Power of Attorney - - -  Does patient want to make changes to medical advance directive? - - - No - Patient declined - - -  Copy of [place] in Chart? - - - No - copy requested - - -  Would patient like information on creating a medical advance directive? - - No - Patient declined - No - Patient declined - No - Patient declined    Current Medications (verified) Outpatient  Encounter Medications as of 12/03/2020  Medication Sig  . amLODipine (NORVASC) 5 MG tablet Take 1 tablet (5 mg total) by mouth daily.  Marland Kitchen aspirin 81 MG tablet Take 81 mg by mouth every evening.   Marland Kitchen atorvastatin (LIPITOR) 40 MG tablet TAKE 1 TABLET BY MOUTH DAILY AT 6 PM.  . lisinopril (ZESTRIL) 20 MG tablet Take 1 tablet (20 mg total) by mouth daily.  . Vitamins-Lipotropics (LIPOFLAVONOID PO) Take 2 capsules by mouth in the morning, at noon, and at bedtime.   No facility-administered encounter medications on file as of 12/03/2020.    Allergies (verified) Patient has no known allergies.   History: Past Medical History:  Diagnosis Date  . Anxiety   . Chronic kidney disease   . Hyperlipidemia   . Hypertension   . Insomnia   . Thyroid disease    Past Surgical History:  Procedure Laterality Date  . BACK SURGERY     x2  . CARPAL TUNNEL RELEASE Right 03/08/2018   Procedure: CARPAL TUNNEL RELEASE;  Surgeon: Earnestine Leys, MD;  Location: [facility] ORS;  Service: Orthopedics;  Laterality: Right;  . COLONOSCOPY WITH PROPOFOL N/A 07/19/2015   Procedure: COLONOSCOPY WITH PROPOFOL;  Surgeon: Manya Silvas, MD;  Location: [facility] ENDOSCOPY;  Service: Endoscopy;  Laterality: N/A;   Family History  Problem Relation Age of Onset  . Hypertension Mother   . Heart disease Mother        heart attack  . Hypertension Sister   . Emphysema Father   . Hypertension Daughter   .  Hypertension Son   . Hypertension Sister   . Hypertension Sister   . Emphysema Sister    Social History   Socioeconomic History  . Marital status: Widowed    Spouse name: Not on file  . Number of children: Not on file  . Years of education: 38  . Highest education level: 12th grade  Occupational History  . Occupation: retired   Tobacco Use  . Smoking status: Former Smoker    Quit date: 08/08/1970    Years since quitting: 50.3  . Smokeless tobacco: Never Used  Vaping Use  . Vaping Use: Never used  Substance and Sexual  Activity  . Alcohol use: Yes    Alcohol/week: 14.0 standard drinks    Types: 14 Glasses of wine per week    Comment: 6 oz glasses/ red wine   . Drug use: No  . Sexual activity: Not Currently  Other Topics Concern  . Not on file  Social History Narrative  . Not on file   Social Determinants of Health   Financial Resource Strain: Low Risk   . Difficulty of Paying Living Expenses: Not hard at all  Food Insecurity: No Food Insecurity  . Worried About Charity fundraiser in the Last Year: Never true  . Ran Out of Food in the Last Year: Never true  Transportation Needs: No Transportation Needs  . Lack of Transportation (Medical): No  . Lack of Transportation (Non-Medical): No  Physical Activity: Sufficiently Active  . Days of Exercise per Week: 3 days  . Minutes of Exercise per Session: 150+ min  Stress: No Stress Concern Present  . Feeling of Stress : Not at all  Social Connections: Not on file    Tobacco Counseling Counseling given: Not Answered   Clinical Intake:  Pre-visit preparation completed: Yes  Pain : No/denies pain     Nutritional Status: BMI of 19-24  Normal Nutritional Risks: None Diabetes: No  How often do you need to have someone help you when you read instructions, pamphlets, or other written materials from your doctor or pharmacy?: 1 - Never What is the last grade level you completed in school?: 12th grade  Diabetic? no  Interpreter Needed?: No  Information entered by :: NAllen LPN   Activities of Daily Living In your present state of health, do you have any difficulty performing the following activities: 12/03/2020 02/07/2020  Hearing? N N  Vision? N N  Difficulty concentrating or making decisions? N N  Walking or climbing stairs? N N  Dressing or bathing? N N  Doing errands, shopping? N N  Preparing Food and eating ? N -  Using the Toilet? N -  In the past six months, have you accidently leaked urine? N -  Do you have problems with loss of  bowel control? N -  Managing your Medications? N -  Managing your Finances? N -  Housekeeping or managing your Housekeeping? N -  Some recent data might be hidden    Patient Care Team: Venita Lick, NP as PCP - General (Nurse Practitioner) Anthonette Legato, MD (Nephrology) Cammie Sickle, MD as Consulting Physician (Internal Medicine)  Indicate any recent Medical Services you may have received from other than [facility] providers in the past year (date may be approximate).     Assessment:   This is a routine wellness examination for Roger Byrd.  Hearing/Vision screen  Hearing Screening   125Hz  250Hz  500Hz  1000Hz  2000Hz  3000Hz  4000Hz  6000Hz  8000Hz   Right ear:  Left ear:           Vision Screening Comments: Regular eye exams, [facility]  Dietary issues and exercise activities discussed: Current Exercise Habits: Home exercise routine, Time (Minutes): > 60, Frequency (Times/Week): 3, Weekly Exercise (Minutes/Week): 0  Goals Addressed            This Visit's Progress   . Patient Stated       12/03/2020, stay active      Depression Screen PHQ 2/9 Scores 12/03/2020 08/09/2020 02/07/2020 11/14/2019 08/18/2019 05/28/2017 05/27/2016  PHQ - 2 Score 0 0 0 0 0 0 0  PHQ- 9 Score - - - - - 0 0    Fall Risk Fall Risk  12/03/2020 08/09/2020 02/07/2020 11/14/2019 01/27/2019  Falls in the past year? 0 0 0 0 0  Number falls in past yr: - - 0 0 -  Injury with Fall? - - 0 0 -  Risk for fall due to : Medication side effect - - - -  Follow up Falls evaluation completed;Education provided;Falls prevention discussed - Falls evaluation completed - Falls evaluation completed    FALL RISK PREVENTION PERTAINING TO THE HOME:  Any stairs in or around the home? Yes  If so, are there any without handrails? No  Home free of loose throw rugs in walkways, pet beds, electrical cords, etc? Yes  Adequate lighting in your home to reduce risk of falls? Yes   ASSISTIVE DEVICES UTILIZED TO PREVENT  FALLS:  Life alert? No  Use of a cane, walker or w/c? No  Grab bars in the bathroom? Yes  Shower chair or bench in shower? No  Elevated toilet seat or a handicapped toilet? Yes   TIMED UP AND GO:  Was the test performed? No .  Cognitive Function:     6CIT Screen 12/03/2020 05/28/2017  What Year? 0 points 0 points  What month? 0 points 0 points  What time? 0 points 0 points  Count back from 20 0 points 0 points  Months in reverse 4 points 0 points  Repeat phrase 0 points 2 points  Total Score 4 2    Immunizations Immunization History  Administered Date(s) Administered  . Influenza, High Dose Seasonal PF 05/14/2018, 04/19/2019  . Influenza,inj,Quad PF,6+ Mos 04/24/2017  . Influenza-Unspecified 03/18/2016, 04/24/2017, 06/19/2020  . PFIZER(Purple Top)SARS-COV-2 Vaccination 08/08/2019, 08/29/2019, 06/19/2020  . Pneumococcal Conjugate-13 04/10/2014  . Pneumococcal Polysaccharide-23 01/02/2008  . Td 12/20/2009  . Tdap 08/09/2020  . Zoster, Live 01/02/2008    TDAP status: Up to date  Flu Vaccine status: Up to date  Pneumococcal vaccine status: Up to date  Covid-19 vaccine status: Completed vaccines  Qualifies for Shingles Vaccine? Yes   Zostavax completed Yes   Shingrix Completed?: No.    Education has been provided regarding the importance of this vaccine. Patient has been advised to call insurance company to determine out of pocket expense if they have not yet received this vaccine. Advised may also receive vaccine at local pharmacy or Health Dept. Verbalized acceptance and understanding.  Screening Tests Health Maintenance  Topic Date Due  . Zoster Vaccines- Shingrix (1 of 2) Never done  . COLONOSCOPY (Pts 45-51yrs Insurance coverage will need to be confirmed)  08/09/2021 (Originally 07/18/2020)  . INFLUENZA VACCINE  01/28/2021  . TETANUS/TDAP  08/09/2030  . COVID-19 Vaccine  Completed  . Hepatitis C Screening  Completed  . PNA vac Low Risk Adult  Completed  .  Pneumococcal Vaccine 16-60 Years old  Aged  Out  . HPV VACCINES  Aged Out    Health Maintenance  Health Maintenance Due  Topic Date Due  . Zoster Vaccines- Shingrix (1 of 2) Never done    Colorectal cancer screening: Type of screening: Colonoscopy. Completed 07/19/2015. Repeat every 5 years  Lung Cancer Screening: (Low Dose CT Chest recommended if Age 17-80 years, 30 pack-year currently smoking OR have quit w/in 15years.) does not qualify.   Lung Cancer Screening Referral: no  Additional Screening:  Hepatitis C Screening: does qualify; Completed 08/09/2020  Vision Screening: Recommended annual ophthalmology exams for early detection of glaucoma and other disorders of the eye. Is the patient up to date with their annual eye exam?  Yes  Who is the provider or what is the name of the office in which the patient attends annual eye exams? [facility] If pt is not established with a provider, would they like to be referred to a provider to establish care? No .   Dental Screening: Recommended annual dental exams for proper oral hygiene  Community Resource Referral / Chronic Care Management: CRR required this visit?  No   CCM required this visit?  No      Plan:     I have personally reviewed and noted the following in the patient's chart:   . Medical and social history . Use of alcohol, tobacco or illicit drugs  . Current medications and supplements including opioid prescriptions. Patient is not currently taking opioid prescriptions. . Functional ability and status . Nutritional status . Physical activity . Advanced directives . List of other physicians . Hospitalizations, surgeries, and ER visits in previous 12 months . Vitals . Screenings to include cognitive, depression, and falls . Referrals and appointments  In addition, I have reviewed and discussed with patient certain preventive protocols, quality metrics, and best practice recommendations. A written  personalized care plan for preventive services as well as general preventive health recommendations were provided to patient.     Kellie Simmering, LPN   09/03/1222   Nurse Notes:

## 2020-12-03 NOTE — Patient Instructions (Signed)
Roger Byrd , Thank you for taking time to come for your Medicare Wellness Visit. I appreciate your ongoing commitment to your health goals. Please review the following plan we discussed and let me know if I can assist you in the future.   Screening recommendations/referrals: Colonoscopy: completed 07/19/2015 Recommended yearly ophthalmology/optometry visit for glaucoma screening and checkup Recommended yearly dental visit for hygiene and checkup  Vaccinations: Influenza vaccine: completed 06/19/2020, due 01/28/2021 Pneumococcal vaccine: completed 04/10/2014 Tdap vaccine: completed 08/09/2020 Shingles vaccine: discussed   Covid-19: 06/19/2020, 08/29/2019, 08/08/2019  Advanced directives: Advance directive discussed with you today.  Conditions/risks identified: none  Next appointment: Follow up in one year for your annual wellness visit.   Preventive Care 80 Years and Older, Male Preventive care refers to lifestyle choices and visits with your health care provider that can promote health and wellness. What does preventive care include?  A yearly physical exam. This is also called an annual well check.  Dental exams once or twice a year.  Routine eye exams. Ask your health care provider how often you should have your eyes checked.  Personal lifestyle choices, including:  Daily care of your teeth and gums.  Regular physical activity.  Eating a healthy diet.  Avoiding tobacco and drug use.  Limiting alcohol use.  Practicing safe sex.  Taking low doses of aspirin every day.  Taking vitamin and mineral supplements as recommended by your health care provider. What happens during an annual well check? The services and screenings done by your health care provider during your annual well check will depend on your age, overall health, lifestyle risk factors, and family history of disease. Counseling  Your health care provider may ask you questions about your:  Alcohol  use.  Tobacco use.  Drug use.  Emotional well-being.  Home and relationship well-being.  Sexual activity.  Eating habits.  History of falls.  Memory and ability to understand (cognition).  Work and work Statistician. Screening  You may have the following tests or measurements:  Height, weight, and BMI.  Blood pressure.  Lipid and cholesterol levels. These may be checked every 5 years, or more frequently if you are over 75 years old.  Skin check.  Lung cancer screening. You may have this screening every year starting at age 22 if you have a 30-pack-year history of smoking and currently smoke or have quit within the past 15 years.  Fecal occult blood test (FOBT) of the stool. You may have this test every year starting at age 28.  Flexible sigmoidoscopy or colonoscopy. You may have a sigmoidoscopy every 5 years or a colonoscopy every 10 years starting at age 46.  Prostate cancer screening. Recommendations will vary depending on your family history and other risks.  Hepatitis C blood test.  Hepatitis B blood test.  Sexually transmitted disease (STD) testing.  Diabetes screening. This is done by checking your blood sugar (glucose) after you have not eaten for a while (fasting). You may have this done every 1-3 years.  Abdominal aortic aneurysm (AAA) screening. You may need this if you are a current or former smoker.  Osteoporosis. You may be screened starting at age 66 if you are at high risk. Talk with your health care provider about your test results, treatment options, and if necessary, the need for more tests. Vaccines  Your health care provider may recommend certain vaccines, such as:  Influenza vaccine. This is recommended every year.  Tetanus, diphtheria, and acellular pertussis (Tdap, Td) vaccine. You may  need a Td booster every 10 years.  Zoster vaccine. You may need this after age 58.  Pneumococcal 13-valent conjugate (PCV13) vaccine. One dose is  recommended after age 49.  Pneumococcal polysaccharide (PPSV23) vaccine. One dose is recommended after age 19. Talk to your health care provider about which screenings and vaccines you need and how often you need them. This information is not intended to replace advice given to you by your health care provider. Make sure you discuss any questions you have with your health care provider. Document Released: 07/13/2015 Document Revised: 03/05/2016 Document Reviewed: 04/17/2015 Elsevier Interactive Patient Education  2017 [place] Prevention in the Home Falls can cause injuries. They can happen to people of all ages. There are many things you can do to make your home safe and to help prevent falls. What can I do on the outside of my home?  Regularly fix the edges of walkways and driveways and fix any cracks.  Remove anything that might make you trip as you walk through a door, such as a raised step or threshold.  Trim any bushes or trees on the path to your home.  Use bright outdoor lighting.  Clear any walking paths of anything that might make someone trip, such as rocks or tools.  Regularly check to see if handrails are loose or broken. Make sure that both sides of any steps have handrails.  Any raised decks and porches should have guardrails on the edges.  Have any leaves, snow, or ice cleared regularly.  Use sand or salt on walking paths during winter.  Clean up any spills in your garage right away. This includes oil or grease spills. What can I do in the bathroom?  Use night lights.  Install grab bars by the toilet and in the tub and shower. Do not use towel bars as grab bars.  Use non-skid mats or decals in the tub or shower.  If you need to sit down in the shower, use a plastic, non-slip stool.  Keep the floor dry. Clean up any water that spills on the floor as soon as it happens.  Remove soap buildup in the tub or shower regularly.  Attach bath mats  securely with double-sided non-slip rug tape.  Do not have throw rugs and other things on the floor that can make you trip. What can I do in the bedroom?  Use night lights.  Make sure that you have a light by your bed that is easy to reach.  Do not use any sheets or blankets that are too big for your bed. They should not hang down onto the floor.  Have a firm chair that has side arms. You can use this for support while you get dressed.  Do not have throw rugs and other things on the floor that can make you trip. What can I do in the kitchen?  Clean up any spills right away.  Avoid walking on wet floors.  Keep items that you use a lot in easy-to-reach places.  If you need to reach something above you, use a strong step stool that has a grab bar.  Keep electrical cords out of the way.  Do not use floor polish or wax that makes floors slippery. If you must use wax, use non-skid floor wax.  Do not have throw rugs and other things on the floor that can make you trip. What can I do with my stairs?  Do not leave any items on  the stairs.  Make sure that there are handrails on both sides of the stairs and use them. Fix handrails that are broken or loose. Make sure that handrails are as long as the stairways.  Check any carpeting to make sure that it is firmly attached to the stairs. Fix any carpet that is loose or worn.  Avoid having throw rugs at the top or bottom of the stairs. If you do have throw rugs, attach them to the floor with carpet tape.  Make sure that you have a light switch at the top of the stairs and the bottom of the stairs. If you do not have them, ask someone to add them for you. What else can I do to help prevent falls?  Wear shoes that:  Do not have high heels.  Have rubber bottoms.  Are comfortable and fit you well.  Are closed at the toe. Do not wear sandals.  If you use a stepladder:  Make sure that it is fully opened. Do not climb a closed  stepladder.  Make sure that both sides of the stepladder are locked into place.  Ask someone to hold it for you, if possible.  Clearly mark and make sure that you can see:  Any grab bars or handrails.  First and last steps.  Where the edge of each step is.  Use tools that help you move around (mobility aids) if they are needed. These include:  Canes.  Walkers.  Scooters.  Crutches.  Turn on the lights when you go into a dark area. Replace any light bulbs as soon as they burn out.  Set up your furniture so you have a clear path. Avoid moving your furniture around.  If any of your floors are uneven, fix them.  If there are any pets around you, be aware of where they are.  Review your medicines with your doctor. Some medicines can make you feel dizzy. This can increase your chance of falling. Ask your doctor what other things that you can do to help prevent falls. This information is not intended to replace advice given to you by your health care provider. Make sure you discuss any questions you have with your health care provider. Document Released: 04/12/2009 Document Revised: 11/22/2015 Document Reviewed: 07/21/2014 Elsevier Interactive Patient Education  2017 [employer].

## 2020-12-20 ENCOUNTER — Other Ambulatory Visit: Payer: Self-pay

## 2020-12-20 ENCOUNTER — Ambulatory Visit (INDEPENDENT_AMBULATORY_CARE_PROVIDER_SITE_OTHER): Payer: Medicare Other | Admitting: Nurse Practitioner

## 2020-12-20 ENCOUNTER — Encounter: Payer: Self-pay | Admitting: Nurse Practitioner

## 2020-12-20 VITALS — BP 142/72 | HR 66 | Temp 98.3°F | Wt 148.6 lb

## 2020-12-20 DIAGNOSIS — H9313 Tinnitus, bilateral: Secondary | ICD-10-CM

## 2020-12-20 DIAGNOSIS — R7989 Other specified abnormal findings of blood chemistry: Secondary | ICD-10-CM | POA: Diagnosis not present

## 2020-12-20 DIAGNOSIS — I10 Essential (primary) hypertension: Secondary | ICD-10-CM

## 2020-12-20 DIAGNOSIS — D631 Anemia in chronic kidney disease: Secondary | ICD-10-CM | POA: Diagnosis not present

## 2020-12-20 DIAGNOSIS — N1831 Chronic kidney disease, stage 3a: Secondary | ICD-10-CM

## 2020-12-20 MED ORDER — LISINOPRIL 10 MG PO TABS: 10.0000 mg | ORAL_TABLET | Freq: Every day | ORAL | 4 refills | Status: DC

## 2020-12-20 NOTE — Assessment & Plan Note (Signed)
Ongoing for over 3-4 years.  He does lateralize to right on tuning fork exam.  Will place referral to ENT for further assessment.  Ears clear on exam.

## 2020-12-20 NOTE — Assessment & Plan Note (Signed)
Noted on recent labs with some occasional constipation -- recheck labs today: TSH and Free T4.  Consider starting medication if ongoing elevations.

## 2020-12-20 NOTE — Patient Instructions (Signed)

## 2020-12-20 NOTE — Progress Notes (Signed)
BP (!) 142/72 (BP Location: Left Arm)   Pulse 66   Temp 98.3 F (36.8 C) (Oral)   Wt 148 lb 9.6 oz (67.4 kg)   SpO2 97%   BMI 22.59 kg/m    Subjective:    Patient ID: Roger Byrd, male    DOB: 1940-09-01, 80 y.o.   MRN: [id number]  HPI: Roger Byrd is a 80 y.o. male  Chief Complaint  Patient presents with   Hypotension    Patient states he first noticed his blood pressure being low about 2-3 weeks ago. Patient states he only has changed his diet and that when he noticed his blood pressure reading low. Patient states he has stopped taking his medication for blood pressure due to his reading being low.    Tinnitus    Patient states he has noticed some ringing in his ears and states he has tried OTC medications (2 bottles) and states that has not helped and states it will come and go, but it sounds like "bees in his ears." Patient states he would like to discuss and states it has been going on for years.    HYPERTENSION Recently stopped taking all blood pressure medications due to low BP, started on June 10th, was playing golf and BP 90/50.  On June 11th was 122/66 and 133/70 when he stopped taking medication.  Then on June 14th his BP started going up -- took pills and then BP dropped to 97/54.  He recently quit drinking red wine daily and eating better, wonders if this helped his BP.  Was off kilter when BP low.  Previously was taking Amlodipine 5 MG daily and Lisinopril 20 MG daily.  Last labs continued to note ongoing mild elevation in TSH 5.810 -- no current medications, he does endorse occasional constipation. Hypertension status: stable  Satisfied with current treatment? no Duration of hypertension: chronic BP monitoring frequency:  daily BP range: 127/73 this morning -- on average 120-130/60-70 with occasional elevations to 160/80 range BP medication side effects:  no Medication compliance: good compliance Previous BP meds:amlodipine and lisinopril Aspirin:  yes Recurrent headaches: no Visual changes: no Palpitations: no Dyspnea: no Chest pain: no Lower extremity edema: no Dizzy/lightheaded: no   CHRONIC KIDNEY DISEASE Followed by nephrology, last 11/29/20  Sees every 6 months.  Labs at visit -- CRT 1.21 and GFR 57, K+ 5.1, PTH 28. Saw hematology for anemia with CKD -- recent H/H 14.0/42.7, MCV 82.8.  Last saw hematology 02/07/20. CKD status: stable Medications renally dose: yes Previous renal evaluation: yes Pneumovax:  Up to Date Influenza Vaccine:  Up to Date  TINNITUS Has had ringing in ears for a "long time" = >3-4years.  Took and OTC vitamin  = Lipo Flavonid, but this did not help. 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 Interim medical history since our last visit reviewed. Allergies and medications reviewed and updated.  Review of Systems  Constitutional:  Negative for activity  change, diaphoresis, fatigue and fever.  HENT:  Positive for tinnitus.   Respiratory:  Negative for cough, chest tightness, shortness of breath and wheezing.   Cardiovascular:  Negative for chest pain, palpitations and leg swelling.  Gastrointestinal: Negative.   Endocrine: Negative for cold intolerance, heat intolerance, polydipsia, polyphagia and polyuria.  Skin: Negative.   Psychiatric/Behavioral:  Negative.     Per HPI unless specifically indicated above     Objective:    BP (!) 142/72 (BP Location: Left Arm)   Pulse 66   Temp 98.3 F (36.8 C) (Oral)   Wt 148 lb 9.6 oz (67.4 kg)   SpO2 97%   BMI 22.59 kg/m   Wt Readings from Last 3 Encounters:  12/20/20 148 lb 9.6 oz (67.4 kg)  12/03/20 150 lb (68 kg)  08/09/20 149 lb 3.2 oz (67.7 kg)    Physical Exam Vitals and nursing note reviewed.  Constitutional:      General: He is awake. He is not in acute distress.    Appearance: He is well-developed and well-groomed. He is not ill-appearing or toxic-appearing.  HENT:     Head: Normocephalic and atraumatic.     Right Ear: Hearing, tympanic membrane, ear canal and external ear normal. No drainage.     Left Ear: Hearing, tympanic membrane, ear canal and external ear normal. No drainage.     Ears:     Weber exam findings: Lateralizes right. Eyes:     General: Lids are normal.        Right eye: No discharge.        Left eye: No discharge.     Conjunctiva/sclera: Conjunctivae normal.     Pupils: Pupils are equal, round, and reactive to light.  Neck:     Thyroid: No thyromegaly.     Vascular: No carotid bruit.     Trachea: Trachea normal.  Cardiovascular:     Rate and Rhythm: Normal rate and regular rhythm.     Heart sounds: Normal heart sounds, S1 normal and S2 normal. No murmur heard.   No gallop.  Pulmonary:     Effort: Pulmonary effort is normal. No accessory muscle usage or respiratory distress.     Breath sounds: Normal breath sounds.  Abdominal:     General: Bowel sounds are normal.     Palpations: Abdomen is soft. There is no hepatomegaly or splenomegaly.  Musculoskeletal:        General: Normal range of motion.     Cervical back: Normal range of motion and neck supple.     Right lower leg: No edema.     Left lower leg: No edema.  Skin:    General: Skin is warm and dry.     Capillary Refill: Capillary refill takes less than 2 seconds.     Findings: No rash.   Neurological:     Mental Status: He is alert and oriented to person, place, and time.     Deep Tendon Reflexes: Reflexes are normal and symmetric.  Psychiatric:        Attention and Perception: Attention normal.        Mood and Affect: Mood normal.        Speech: Speech normal.        Behavior: Behavior normal. Behavior is cooperative.        Thought Content: Thought content normal.    Results for orders placed or performed in visit on 08/09/20  Lipid Panel w/o Chol/HDL Ratio  Result Value Ref Range  Cholesterol, Total 192 100 - 199 mg/dL   Triglycerides 203 (H) 0 - 149 mg/dL   HDL 69 >39 mg/dL   VLDL Cholesterol Cal 34 5 - 40 mg/dL   LDL Chol Calc (NIH) 89 0 - 99 mg/dL  TSH  Result Value Ref Range   TSH 5.810 (H) 0.450 - 4.500 uIU/mL  T4, free  Result Value Ref Range   Free T4 1.06 0.82 - 1.77 ng/dL  Hepatitis C antibody  Result Value Ref Range   Hep C Virus Ab <0.1 0.0 - 0.9 s/co ratio      Assessment & Plan:   Problem List Items Addressed This Visit       Cardiovascular and Mediastinum   Hypertension    Chronic, ongoing with initial BP elevated on exam today -- he stopped taking all medications due to low BP and feeling off kilter.  At this time recommend we stop Amlodipine and decrease Lisinopril to 10 MG daily (he will take 1/2 his current 20 MG tablets).  Discussed at length with him. Recommend continue to monitor BP at home regularly and document for provider + focus on DASH diet.  Kidney function labs up to date with nephrology recently.  Return in 4 weeks.       Relevant Medications   lisinopril (ZESTRIL) 10 MG tablet     Genitourinary   Anemia due to stage 3a chronic kidney disease (HCC)    Chronic, stable at this time.  Continue collaboration with hematology and nephrology + current medication regimen.         CKD (chronic kidney disease) - Primary    Chronic, ongoing, followed by nephrology.  Continue this collaboration and current medication regimen,  including Lisinopril for kidney protection.   Kidney function labs up to date with nephrology recently, note reviewed.  Return in 6 months.         Other   Elevated TSH    Noted on recent labs with some occasional constipation -- recheck labs today: TSH and Free T4.  Consider starting medication if ongoing elevations.       Relevant Orders   T4, free   TSH     Follow up plan: Return in about 4 weeks (around 01/17/2021) for HTN and THYROID.

## 2020-12-20 NOTE — Assessment & Plan Note (Signed)
Chronic, ongoing with initial BP elevated on exam today -- he stopped taking all medications due to low BP and feeling off kilter.  At this time recommend we stop Amlodipine and decrease Lisinopril to 10 MG daily (he will take 1/2 his current 20 MG tablets).  Discussed at length with him. Recommend continue to monitor BP at home regularly and document for provider + focus on DASH diet.  Kidney function labs up to date with nephrology recently.  Return in 4 weeks.

## 2020-12-20 NOTE — Assessment & Plan Note (Signed)
Chronic, ongoing, followed by nephrology.  Continue this collaboration and current medication regimen, including Lisinopril for kidney protection.   Kidney function labs up to date with nephrology recently, note reviewed.  Return in 6 months.

## 2020-12-20 NOTE — Assessment & Plan Note (Signed)
Chronic, stable at this time.  Continue collaboration with hematology and nephrology + current medication regimen.

## 2020-12-21 ENCOUNTER — Other Ambulatory Visit: Payer: Self-pay | Admitting: Nurse Practitioner

## 2020-12-21 LAB — T4, FREE: Free T4: 1.08 ng/dL (ref 0.82–1.77)

## 2020-12-21 LAB — TSH: TSH: 5.89 u[IU]/mL — ABNORMAL HIGH (ref 0.450–4.500)

## 2020-12-21 MED ORDER — LEVOTHYROXINE SODIUM 25 MCG PO TABS: 25.0000 ug | ORAL_TABLET | Freq: Every day | ORAL | 4 refills | Status: DC

## 2020-12-21 NOTE — Progress Notes (Signed)
Contacted via [place] morning Jacobb, your labs have returned and TSH remains on higher side with Free T4 normal.  I would like to start a low dose of Levothyroxine 25 MCG daily. I will send this in.  Recommend to take this in morning 30 minutes prior to eating and prior to other medications.  I would then like to follow-up in 6 weeks in office, please schedule this.  Any questions? Keep being awesome!!  Thank you for allowing me to participate in your care.  I appreciate you. Kindest regards, Kaizen Ibsen

## 2020-12-24 ENCOUNTER — Ambulatory Visit: Payer: Self-pay | Admitting: *Deleted

## 2020-12-24 NOTE — Telephone Encounter (Signed)
Pt scheduled to see Dr.Johnson on 12/25/2020 as he did not want to wait to see pcp as he stated he is not going to ER and he wanted a cardiologist referral as soon as possible.

## 2020-12-24 NOTE — Telephone Encounter (Signed)
C/o chest tightness after last Dr. Visit . C/o shortness of breath after walking up hills and gives out of breath playing golf. Chest tightness comes and goes with exertion. Takes 1- 2 minutes to recover at times. C/o no energy. Denies chest pain at this time. Denies difficulty breathing, dizziness, nausea , sweating. Requesting cardiac referral. Care advise given. Patient verbalized understanding of care advise and to call back or go to ED if systems worsen. Please advise

## 2020-12-24 NOTE — Telephone Encounter (Signed)
Reason for Disposition  [1] Chest pain(s) lasting a few seconds AND [2] persists > 3 days  Answer Assessment - Initial Assessment Questions 1. LOCATION: "Where does it hurt?"       Upper chest with exertion 2. RADIATION: "Does the pain go anywhere else?" (e.g., into neck, jaw, arms, back)     na 3. ONSET: "When did the chest pain begin?" (Minutes, hours or days)      Recently  4. PATTERN "Does the pain come and go, or has it been constant since it started?"  "Does it get worse with exertion?"      Comes and goes, when walking up hill or exertion 5. DURATION: "How long does it last" (e.g., seconds, minutes, hours)     1-2 minutes 6. SEVERITY: "How bad is the pain?"  (e.g., Scale 1-10; mild, moderate, or severe)    - MILD (1-3): doesn't interfere with normal activities     - MODERATE (4-7): interferes with normal activities or awakens from sleep    - SEVERE (8-10): excruciating pain, unable to do any normal activities       Na  7. CARDIAC RISK FACTORS: "Do you have any history of heart problems or risk factors for heart disease?" (e.g., angina, prior heart attack; diabetes, high blood pressure, high cholesterol, smoker, or strong family history of heart disease)     HTN 8. PULMONARY RISK FACTORS: "Do you have any history of lung disease?"  (e.g., blood clots in lung, asthma, emphysema, birth control pills)     na 9. CAUSE: "What do you think is causing the chest pain?"     Not sure , heart blockage 10. OTHER SYMPTOMS: "Do you have any other symptoms?" (e.g., dizziness, nausea, vomiting, sweating, fever, difficulty breathing, cough)       Shortness of breath chest tightness 11. PREGNANCY: "Is there any chance you are pregnant?" "When was your last menstrual period?"       na  Protocols used: Chest Pain-A-AH

## 2020-12-24 NOTE — Telephone Encounter (Signed)
Pt verbalized understanding.

## 2020-12-25 ENCOUNTER — Other Ambulatory Visit: Payer: Self-pay

## 2020-12-25 ENCOUNTER — Encounter: Payer: Self-pay | Admitting: Family Medicine

## 2020-12-25 ENCOUNTER — Ambulatory Visit (INDEPENDENT_AMBULATORY_CARE_PROVIDER_SITE_OTHER): Payer: Medicare Other | Admitting: Family Medicine

## 2020-12-25 VITALS — BP 140/56 | HR 57 | Temp 98.1°F | Ht 66.42 in | Wt 149.6 lb

## 2020-12-25 DIAGNOSIS — Z87891 Personal history of nicotine dependence: Secondary | ICD-10-CM | POA: Diagnosis not present

## 2020-12-25 DIAGNOSIS — I10 Essential (primary) hypertension: Secondary | ICD-10-CM | POA: Diagnosis not present

## 2020-12-25 DIAGNOSIS — R0600 Dyspnea, unspecified: Secondary | ICD-10-CM | POA: Diagnosis not present

## 2020-12-25 DIAGNOSIS — I272 Pulmonary hypertension, unspecified: Secondary | ICD-10-CM | POA: Diagnosis not present

## 2020-12-25 DIAGNOSIS — J811 Chronic pulmonary edema: Secondary | ICD-10-CM | POA: Diagnosis not present

## 2020-12-25 DIAGNOSIS — E02 Subclinical iodine-deficiency hypothyroidism: Secondary | ICD-10-CM | POA: Diagnosis not present

## 2020-12-25 DIAGNOSIS — R079 Chest pain, unspecified: Secondary | ICD-10-CM

## 2020-12-25 DIAGNOSIS — I214 Non-ST elevation (NSTEMI) myocardial infarction: Secondary | ICD-10-CM | POA: Diagnosis not present

## 2020-12-25 DIAGNOSIS — I081 Rheumatic disorders of both mitral and tricuspid valves: Secondary | ICD-10-CM | POA: Diagnosis not present

## 2020-12-25 DIAGNOSIS — I129 Hypertensive chronic kidney disease with stage 1 through stage 4 chronic kidney disease, or unspecified chronic kidney disease: Secondary | ICD-10-CM | POA: Diagnosis not present

## 2020-12-25 DIAGNOSIS — Z20822 Contact with and (suspected) exposure to covid-19: Secondary | ICD-10-CM | POA: Diagnosis not present

## 2020-12-25 DIAGNOSIS — N183 Chronic kidney disease, stage 3 unspecified: Secondary | ICD-10-CM | POA: Diagnosis not present

## 2020-12-25 DIAGNOSIS — Z7982 Long term (current) use of aspirin: Secondary | ICD-10-CM | POA: Diagnosis not present

## 2020-12-25 DIAGNOSIS — R748 Abnormal levels of other serum enzymes: Secondary | ICD-10-CM | POA: Diagnosis not present

## 2020-12-25 DIAGNOSIS — I2510 Atherosclerotic heart disease of native coronary artery without angina pectoris: Secondary | ICD-10-CM | POA: Diagnosis not present

## 2020-12-25 DIAGNOSIS — R0602 Shortness of breath: Secondary | ICD-10-CM | POA: Diagnosis not present

## 2020-12-25 DIAGNOSIS — R0789 Other chest pain: Secondary | ICD-10-CM | POA: Diagnosis not present

## 2020-12-25 DIAGNOSIS — N1831 Chronic kidney disease, stage 3a: Secondary | ICD-10-CM | POA: Diagnosis not present

## 2020-12-25 DIAGNOSIS — I252 Old myocardial infarction: Secondary | ICD-10-CM | POA: Insufficient documentation

## 2020-12-25 DIAGNOSIS — E785 Hyperlipidemia, unspecified: Secondary | ICD-10-CM | POA: Diagnosis not present

## 2020-12-25 NOTE — Progress Notes (Signed)
EKG interpreted by me on 12/25/20. NSR at 64bpm with ST depression in V3-4.

## 2020-12-25 NOTE — Progress Notes (Signed)
BP (!) 140/56 (BP Location: Right Arm, Patient Position: Standing)   Pulse (!) 57   Temp 98.1 F (36.7 C) (Oral)   Ht 5' 6.42" (1.687 m)   Wt 149 lb 9.6 oz (67.9 kg)   SpO2 98%   BMI 23.84 kg/m    Subjective:    Patient ID: Roger Byrd, male    DOB: [birth year], 80 y.o.   MRN: [id number]  HPI: [name] is a 80 y.o. male  Chief Complaint  Patient presents with   Dizziness    States that he feels off balance   Shortness of Breath    When walking when he went golfing yesterday    Fatigue    After taking a shower this morning he felt plumb wore out.   Chest Pain    Had some chest tightness in chest a few days ago, states he had no pain.   BP MED    Has not taken his lisinopril since Thursday last week   Referral    Wants referral to Cardiology    CHEST TIGHTNESS/SOB Time since onset: yesterday Onset: sudden Quality: tightness Severity: mild Location: upper sternum Radiation: none Episode duration: couple of minutes Frequency: 1x a couple of days ago Related to exertion: yes Activity when pain started: playing golf Trauma: no Anxiety/recent stressors: no Status: worse Treatments attempted: nothing  Current pain status: pain free Shortness of breath: yes Cough: yes, non-productive Nausea: no Diaphoresis: no Heartburn: no Palpitations: no   Relevant past medical, surgical, family and social history reviewed and updated as indicated. Interim medical history since our last visit reviewed. Allergies and medications reviewed and updated.  Review of Systems  Constitutional:  Positive for fatigue. Negative for activity change, appetite change, chills, diaphoresis, fever and unexpected weight change.  HENT: Negative.    Respiratory:  Positive for chest tightness and shortness of breath. Negative for apnea, cough, choking, wheezing and stridor.   Cardiovascular: Negative.   Neurological:  Positive for dizziness and light-headedness. Negative for tremors,  seizures, syncope, facial asymmetry, speech difficulty, weakness, numbness and headaches.  Psychiatric/Behavioral: Negative.     Per HPI unless specifically indicated above     Objective:    BP (!) 140/56 (BP Location: Right Arm, Patient Position: Standing)   Pulse (!) 57   Temp 98.1 F (36.7 C) (Oral)   Ht 5' 6.42" (1.687 m)   Wt 149 lb 9.6 oz (67.9 kg)   SpO2 98%   BMI 23.84 kg/m   Wt Readings from Last 3 Encounters:  12/25/20 149 lb 9.6 oz (67.9 kg)  12/20/20 148 lb 9.6 oz (67.4 kg)  12/03/20 150 lb (68 kg)   No data found.  Physical Exam Vitals and nursing note reviewed.  Constitutional:      General: He is not in acute distress.    Appearance: Normal appearance. He is not ill-appearing, toxic-appearing or diaphoretic.  HENT:     Head: Normocephalic and atraumatic.     Right Ear: External ear normal.     Left Ear: External ear normal.     Nose: Nose normal.     Mouth/Throat:     Mouth: Mucous membranes are moist.     Pharynx: Oropharynx is clear.  Eyes:     General: No scleral icterus.       Right eye: No discharge.        Left eye: No discharge.     Extraocular Movements: Extraocular movements intact.     Conjunctiva/sclera:  Conjunctivae normal.     Pupils: Pupils are equal, round, and reactive to light.  Cardiovascular:     Rate and Rhythm: Normal rate and regular rhythm.     Pulses: Normal pulses.     Heart sounds: Normal heart sounds. No murmur heard.   No friction rub. No gallop.  Pulmonary:     Effort: Pulmonary effort is normal. No respiratory distress.     Breath sounds: Normal breath sounds. No stridor. No wheezing, rhonchi or rales.  Chest:     Chest wall: No tenderness.  Musculoskeletal:        General: Normal range of motion.     Cervical back: Normal range of motion and neck supple.  Skin:    General: Skin is warm and dry.     Capillary Refill: Capillary refill takes less than 2 seconds.     Coloration: Skin is not jaundiced or pale.      Findings: No bruising, erythema, lesion or rash.  Neurological:     General: No focal deficit present.     Mental Status: He is alert and oriented to person, place, and time. Mental status is at baseline.  Psychiatric:        Mood and Affect: Mood normal.        Behavior: Behavior normal.        Thought Content: Thought content normal.        Judgment: Judgment normal.    Results for orders placed or performed in visit on 12/20/20  T4, free  Result Value Ref Range   Free T4 1.08 0.82 - 1.77 ng/dL  TSH  Result Value Ref Range   TSH 5.890 (H) 0.450 - 4.500 uIU/mL      Assessment & Plan:   Problem List Items Addressed This Visit       Other   SOB (shortness of breath) - Primary    New, sudden onset of SOB with a couple episodes of chest tightness over the past few days. EKG with ST depression in V3-4. Will send to ER for further evaluation. Patient prefers [facility]. Report called. Referral to cardiology placed today for after ER visit.        Relevant Orders   Ambulatory referral to Cardiology   Other Visit Diagnoses     Chest pain, unspecified type       See discussion under SOB   Relevant Orders   EKG 12-Lead (Completed)   Ambulatory referral to Cardiology        Follow up plan: Return if symptoms worsen or fail to improve.

## 2020-12-25 NOTE — Assessment & Plan Note (Signed)
New, sudden onset of SOB with a couple episodes of chest tightness over the past few days. EKG with ST depression in V3-4. Will send to ER for further evaluation. Patient prefers [facility] Report called. Referral to cardiology placed today for after ER visit.

## 2020-12-26 DIAGNOSIS — I081 Rheumatic disorders of both mitral and tricuspid valves: Secondary | ICD-10-CM | POA: Diagnosis not present

## 2020-12-26 DIAGNOSIS — I2510 Atherosclerotic heart disease of native coronary artery without angina pectoris: Secondary | ICD-10-CM | POA: Diagnosis not present

## 2020-12-26 DIAGNOSIS — I214 Non-ST elevation (NSTEMI) myocardial infarction: Secondary | ICD-10-CM | POA: Diagnosis not present

## 2020-12-26 DIAGNOSIS — I272 Pulmonary hypertension, unspecified: Secondary | ICD-10-CM | POA: Diagnosis not present

## 2021-01-08 DIAGNOSIS — I2119 ST elevation (STEMI) myocardial infarction involving other coronary artery of inferior wall: Secondary | ICD-10-CM | POA: Diagnosis not present

## 2021-01-08 DIAGNOSIS — Z7689 Persons encountering health services in other specified circumstances: Secondary | ICD-10-CM | POA: Diagnosis not present

## 2021-01-08 DIAGNOSIS — I2510 Atherosclerotic heart disease of native coronary artery without angina pectoris: Secondary | ICD-10-CM | POA: Insufficient documentation

## 2021-01-17 ENCOUNTER — Ambulatory Visit: Payer: Medicare Other | Admitting: Nurse Practitioner

## 2021-02-06 ENCOUNTER — Inpatient Hospital Stay (HOSPITAL_BASED_OUTPATIENT_CLINIC_OR_DEPARTMENT_OTHER): Payer: Medicare Other | Admitting: Internal Medicine

## 2021-02-06 ENCOUNTER — Inpatient Hospital Stay: Payer: Medicare Other | Attending: Internal Medicine

## 2021-02-06 ENCOUNTER — Other Ambulatory Visit: Payer: Self-pay

## 2021-02-06 DIAGNOSIS — I129 Hypertensive chronic kidney disease with stage 1 through stage 4 chronic kidney disease, or unspecified chronic kidney disease: Secondary | ICD-10-CM | POA: Diagnosis not present

## 2021-02-06 DIAGNOSIS — N1831 Chronic kidney disease, stage 3a: Secondary | ICD-10-CM | POA: Diagnosis not present

## 2021-02-06 DIAGNOSIS — D631 Anemia in chronic kidney disease: Secondary | ICD-10-CM | POA: Insufficient documentation

## 2021-02-06 DIAGNOSIS — E785 Hyperlipidemia, unspecified: Secondary | ICD-10-CM | POA: Insufficient documentation

## 2021-02-06 DIAGNOSIS — Z7982 Long term (current) use of aspirin: Secondary | ICD-10-CM | POA: Insufficient documentation

## 2021-02-06 DIAGNOSIS — N182 Chronic kidney disease, stage 2 (mild): Secondary | ICD-10-CM | POA: Insufficient documentation

## 2021-02-06 DIAGNOSIS — Z79899 Other long term (current) drug therapy: Secondary | ICD-10-CM | POA: Diagnosis not present

## 2021-02-06 DIAGNOSIS — I2510 Atherosclerotic heart disease of native coronary artery without angina pectoris: Secondary | ICD-10-CM | POA: Insufficient documentation

## 2021-02-06 DIAGNOSIS — Z955 Presence of coronary angioplasty implant and graft: Secondary | ICD-10-CM | POA: Diagnosis not present

## 2021-02-06 DIAGNOSIS — E079 Disorder of thyroid, unspecified: Secondary | ICD-10-CM | POA: Insufficient documentation

## 2021-02-06 LAB — CBC WITH DIFFERENTIAL/PLATELET
Abs Immature Granulocytes: 0.02 10*3/uL (ref 0.00–0.07)
Basophils Absolute: 0.1 10*3/uL (ref 0.0–0.1)
Basophils Relative: 1 %
Eosinophils Absolute: 0.3 10*3/uL (ref 0.0–0.5)
Eosinophils Relative: 4 %
HCT: 40.3 % (ref 39.0–52.0)
Hemoglobin: 13.2 g/dL (ref 13.0–17.0)
Immature Granulocytes: 0 %
Lymphocytes Relative: 25 %
Lymphs Abs: 1.7 10*3/uL (ref 0.7–4.0)
MCH: 26.5 pg (ref 26.0–34.0)
MCHC: 32.8 g/dL (ref 30.0–36.0)
MCV: 80.9 fL (ref 80.0–100.0)
Monocytes Absolute: 0.6 10*3/uL (ref 0.1–1.0)
Monocytes Relative: 9 %
Neutro Abs: 4.2 10*3/uL (ref 1.7–7.7)
Neutrophils Relative %: 61 %
Platelets: 288 10*3/uL (ref 150–400)
RBC: 4.98 MIL/uL (ref 4.22–5.81)
RDW: 13.5 % (ref 11.5–15.5)
WBC: 6.8 10*3/uL (ref 4.0–10.5)
nRBC: 0 % (ref 0.0–0.2)

## 2021-02-06 LAB — IRON AND TIBC
Iron: 60 ug/dL (ref 45–182)
Saturation Ratios: 17 % — ABNORMAL LOW (ref 17.9–39.5)
TIBC: 346 ug/dL (ref 250–450)
UIBC: 286 ug/dL

## 2021-02-06 LAB — BASIC METABOLIC PANEL
Anion gap: 8 (ref 5–15)
BUN: 22 mg/dL (ref 8–23)
CO2: 23 mmol/L (ref 22–32)
Calcium: 8.8 mg/dL — ABNORMAL LOW (ref 8.9–10.3)
Chloride: 106 mmol/L (ref 98–111)
Creatinine, Ser: 1.27 mg/dL — ABNORMAL HIGH (ref 0.61–1.24)
GFR, Estimated: 57 mL/min — ABNORMAL LOW (ref >60)
Glucose, Bld: 96 mg/dL (ref 70–99)
Potassium: 4 mmol/L (ref 3.5–5.1)
Sodium: 137 mmol/L (ref 135–145)

## 2021-02-06 LAB — FERRITIN: Ferritin: 29 ng/mL (ref 24–336)

## 2021-02-06 NOTE — Progress Notes (Signed)
[facility] CONSULT NOTE  Patient Care Team: Venita Lick, NP as PCP - General (Nurse Practitioner) Anthonette Legato, MD (Nephrology) Cammie Sickle, MD as Consulting Physician (Internal Medicine)  CHIEF COMPLAINTS/PURPOSE OF CONSULTATION: Anemia  # ANEMIA/ CKD-II; last colo- 3 years [Dr.Elliot]; awaiting re-eval.   # CKD- stage II-III [Dr.Kolluru]  Oncology History   No history exists.   HISTORY OF PRESENTING ILLNESS:   Roger Byrd 80 y.o.  male with a history of mild CKD stage II-III and also iron deficient anemia-is here for follow-up.  In the interim patient diagnosed with CAD status post stenting at [facility].  Otherwise patient continues to be physically active.  Denies any blood in stools black or stool.  No nausea vomiting.  Review of Systems  Constitutional:  Negative for chills, diaphoresis, fever, malaise/fatigue and weight loss.  HENT:  Negative for nosebleeds and sore throat.   Eyes:  Negative for double vision.  Respiratory:  Negative for cough, hemoptysis, sputum production, shortness of breath and wheezing.   Cardiovascular:  Negative for chest pain, palpitations, orthopnea and leg swelling.  Gastrointestinal:  Negative for abdominal pain, blood in stool, constipation, diarrhea, heartburn, melena, nausea and vomiting.  Genitourinary:  Negative for dysuria, frequency and urgency.  Musculoskeletal:  Negative for back pain and joint pain.  Skin: Negative.  Negative for itching and rash.  Neurological:  Negative for dizziness, tingling, focal weakness, weakness and headaches.  Endo/Heme/Allergies:  Does not bruise/bleed easily.  Psychiatric/Behavioral:  Negative for depression. The patient is not nervous/anxious and does not have insomnia.     MEDICAL HISTORY:  Past Medical History:  Diagnosis Date   Anxiety    Chronic kidney disease    Hyperlipidemia    Hypertension    Insomnia    Thyroid disease     SURGICAL HISTORY: Past Surgical  History:  Procedure Laterality Date   BACK SURGERY     x2   CARPAL TUNNEL RELEASE Right 03/08/2018   Procedure: CARPAL TUNNEL RELEASE;  Surgeon: Earnestine Leys, MD;  Location: [facility] ORS;  Service: Orthopedics;  Laterality: Right;   COLONOSCOPY WITH PROPOFOL N/A 07/19/2015   Procedure: COLONOSCOPY WITH PROPOFOL;  Surgeon: Manya Silvas, MD;  Location: [facility] ENDOSCOPY;  Service: Endoscopy;  Laterality: N/A;    SOCIAL HISTORY: Social History   Socioeconomic History   Marital status: Widowed    Spouse name: Not on file   Number of children: Not on file   Years of education: 12   Highest education level: 12th grade  Occupational History   Occupation: retired   Tobacco Use   Smoking status: Former    Types: Cigarettes    Quit date: 08/08/1970    Years since quitting: 50.5   Smokeless tobacco: Never  Vaping Use   Vaping Use: Never used  Substance and Sexual Activity   Alcohol use: Yes    Alcohol/week: 14.0 standard drinks    Types: 14 Glasses of wine per week    Comment: 6 oz glasses/ red wine    Drug use: No   Sexual activity: Not Currently  Other Topics Concern   Not on file  Social History Narrative   Not on file   Social Determinants of Health   Financial Resource Strain: Low Risk    Difficulty of Paying Living Expenses: Not hard at all  Food Insecurity: No Food Insecurity   Worried About Charity fundraiser in the Last Year: Never true   Arboriculturist in  the Last Year: Never true  Transportation Needs: No Transportation Needs   Lack of Transportation (Medical): No   Lack of Transportation (Non-Medical): No  Physical Activity: Sufficiently Active   Days of Exercise per Week: 3 days   Minutes of Exercise per Session: 150+ min  Stress: No Stress Concern Present   Feeling of Stress : Not at all  Social Connections: Not on file  Intimate Partner Violence: Not on file    FAMILY HISTORY: Family History  Problem Relation Age of Onset   Hypertension Mother    Heart  disease Mother        heart attack   Hypertension Sister    Emphysema Father    Hypertension Daughter    Hypertension Son    Hypertension Sister    Hypertension Sister    Emphysema Sister     ALLERGIES:  has No Known Allergies.  MEDICATIONS:  Current Outpatient Medications  Medication Sig Dispense Refill   aspirin 81 MG tablet Take 81 mg by mouth every evening.      atorvastatin (LIPITOR) 80 MG tablet Take 1 tablet by mouth daily at 12 noon.     carvedilol (COREG) 3.125 MG tablet Take 1 tablet by mouth 2 (two) times daily.     ticagrelor (BRILINTA) 90 MG TABS tablet Take 1 tablet by mouth 2 (two) times daily.     No current facility-administered medications for this visit.      Marland Kitchen  PHYSICAL EXAMINATION: ECOG PERFORMANCE STATUS: 0 - Asymptomatic  Vitals:   02/06/21 1345  BP: (!) 167/76  Pulse: (!) 55  Resp: 18  Temp: (!) 95.7 F (35.4 C)   Filed Weights   02/06/21 1349  Weight: 145 lb 12.8 oz (66.1 kg)    Physical Exam HENT:     Head: Normocephalic and atraumatic.     Mouth/Throat:     Pharynx: No oropharyngeal exudate.  Eyes:     Pupils: Pupils are equal, round, and reactive to light.  Cardiovascular:     Rate and Rhythm: Normal rate and regular rhythm.  Pulmonary:     Effort: No respiratory distress.     Breath sounds: No wheezing.  Abdominal:     General: Bowel sounds are normal. There is no distension.     Palpations: Abdomen is soft. There is no mass.     Tenderness: There is no abdominal tenderness. There is no guarding or rebound.  Musculoskeletal:        General: No tenderness. Normal range of motion.     Cervical back: Normal range of motion and neck supple.  Skin:    General: Skin is warm.  Neurological:     Mental Status: He is alert and oriented to person, place, and time.  Psychiatric:        Mood and Affect: Affect normal.     LABORATORY DATA:  I have reviewed the data as listed Lab Results  Component Value Date   WBC 6.8  02/06/2021   HGB 13.2 02/06/2021   HCT 40.3 02/06/2021   MCV 80.9 02/06/2021   PLT 288 02/06/2021   Recent Labs    02/07/20 1439 02/06/21 1307  NA 137 137  K 4.4 4.0  CL 102 106  CO2 26 23  GLUCOSE 115* 96  BUN 26* 22  CREATININE 1.29* 1.27*  CALCIUM 9.0 8.8*  GFRNONAA 53* 57*  GFRAA >60  --     RADIOGRAPHIC STUDIES: I have personally reviewed the radiological images as listed and  agreed with the findings in the report. No results found.  ASSESSMENT & PLAN:   Anemia due to stage 3a chronic kidney disease # Iron deficiency/anemia/ CKD.  Hemoglobin 13.2.  Patient currently off iron pills [?  Absorption issues with his antiplatelet therapy].  Recommend increase intake of iron rich foods/dietary supplementation.   # CKD stage III-creatnine 1.3; STABLE.   # CAD s/p stent- [facility]- June 2022; Dr.K]- Stable.   #Patient is clinically stable and will follow up with [place] as needed.  Patient will call us if any concerns in the near future.  Patient agrees with the plan.  # DISPOSITION:  # Follow up as needed- Dr.B  All questions were answered. The patient knows to call the clinic with any problems, questions or concerns.    Cammie Sickle, MD 02/06/2021 2:18 PM

## 2021-02-06 NOTE — Assessment & Plan Note (Addendum)
#   Iron deficiency/anemia/ CKD.  Hemoglobin 13.2.  Patient currently off iron pills [?  Absorption issues with his antiplatelet therapy].  Recommend increase intake of iron rich foods/dietary supplementation.   # CKD stage III-creatnine 1.3; STABLE.   # CAD s/p stent- [facility]- June 2022; Dr.K]- Stable.   #Patient is clinically stable and will follow up with [place] as needed.  Patient will call us if any concerns in the near future.  Patient agrees with the plan.  # DISPOSITION:  # Follow up as needed- Dr.B

## 2021-02-07 DIAGNOSIS — I2510 Atherosclerotic heart disease of native coronary artery without angina pectoris: Secondary | ICD-10-CM | POA: Diagnosis not present

## 2021-02-07 DIAGNOSIS — I2119 ST elevation (STEMI) myocardial infarction involving other coronary artery of inferior wall: Secondary | ICD-10-CM | POA: Diagnosis not present

## 2021-02-09 ENCOUNTER — Encounter: Payer: Self-pay | Admitting: Nurse Practitioner

## 2021-02-14 ENCOUNTER — Encounter: Payer: Self-pay | Admitting: Nurse Practitioner

## 2021-02-14 ENCOUNTER — Ambulatory Visit (INDEPENDENT_AMBULATORY_CARE_PROVIDER_SITE_OTHER): Payer: Medicare Other | Admitting: Nurse Practitioner

## 2021-02-14 ENCOUNTER — Other Ambulatory Visit: Payer: Self-pay

## 2021-02-14 VITALS — BP 128/64 | HR 62 | Temp 98.0°F | Ht 66.0 in | Wt 146.6 lb

## 2021-02-14 DIAGNOSIS — R7301 Impaired fasting glucose: Secondary | ICD-10-CM | POA: Diagnosis not present

## 2021-02-14 DIAGNOSIS — N1831 Chronic kidney disease, stage 3a: Secondary | ICD-10-CM | POA: Diagnosis not present

## 2021-02-14 DIAGNOSIS — I252 Old myocardial infarction: Secondary | ICD-10-CM

## 2021-02-14 DIAGNOSIS — I2510 Atherosclerotic heart disease of native coronary artery without angina pectoris: Secondary | ICD-10-CM | POA: Diagnosis not present

## 2021-02-14 DIAGNOSIS — E782 Mixed hyperlipidemia: Secondary | ICD-10-CM

## 2021-02-14 DIAGNOSIS — R7989 Other specified abnormal findings of blood chemistry: Secondary | ICD-10-CM

## 2021-02-14 DIAGNOSIS — Z0000 Encounter for general adult medical examination without abnormal findings: Secondary | ICD-10-CM | POA: Insufficient documentation

## 2021-02-14 DIAGNOSIS — N400 Enlarged prostate without lower urinary tract symptoms: Secondary | ICD-10-CM

## 2021-02-14 DIAGNOSIS — I10 Essential (primary) hypertension: Secondary | ICD-10-CM | POA: Diagnosis not present

## 2021-02-14 DIAGNOSIS — D631 Anemia in chronic kidney disease: Secondary | ICD-10-CM | POA: Diagnosis not present

## 2021-02-14 LAB — MICROALBUMIN, URINE WAIVED
Creatinine, Urine Waived: 10 mg/dL (ref 10–300)
Microalb, Ur Waived: 10 mg/L (ref 0–19)

## 2021-02-14 NOTE — Assessment & Plan Note (Signed)
Chronic, ongoing with initial BP elevated on exam today, but repeat at goal and closer to home readings.  Average at home 130/70 with occasional higher or lower readings.  Had one episode recently of hypotension post golf game, recommend he wear compression hose and drink plenty of fluid when out on course + play shorter game if not feeling well.  At this time continue current medication regimen and adjust as needed. Recommend continue to monitor BP at home regularly and document for provider + focus on DASH diet.  Kidney function labs + CBC up to date with nephrology recently.  Return in 6 months, continue to collaborate with cardiology.

## 2021-02-14 NOTE — Assessment & Plan Note (Signed)
Health maintenance reviewed with patient today: - Colonoscopy = wishes to hold off on this until new year due to recent NSTEMI - Tetanus = up to date and due next 08/09/2030 - Shingrix = wishes to check with his insurance

## 2021-02-14 NOTE — Assessment & Plan Note (Signed)
Noted on labs with some occasional constipation -- recheck labs today: TSH, antibody, and Free T4.  Consider restarting medication, has Levothyroxine 25 MCG left at home, as was discontinued in hospital.

## 2021-02-14 NOTE — Assessment & Plan Note (Signed)
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  Continue collaboration with cardiology and current medication regimen as prescribed by them.  Lipid panel today. 

## 2021-02-14 NOTE — Assessment & Plan Note (Addendum)
Chronic, ongoing, followed by nephrology.  Continue this collaboration and current medication regimen, may benefit return to ACE or ARB in future, this was discontinued in June 2022 due to NSTEMI.   Kidney function labs up to date with nephrology recently, note reviewed and labs showing improvement.  Urine ALB today.  Return in 6 months.

## 2021-02-14 NOTE — Progress Notes (Signed)
BP 128/64 (BP Location: Left Arm, Patient Position: Sitting, Cuff Size: Normal)   Pulse 62 Comment: apical  Temp 98 F (36.7 C) (Oral)   Ht '5\' 6"'$  (1.676 m)   Wt 146 lb 9.6 oz (66.5 kg)   SpO2 96%   BMI 23.66 kg/m    Subjective:    Patient ID: Roger Byrd, male    DOB: [birth year], 80 y.o.   MRN: [id number]  HPI: Roger Byrd is a 80 y.o. male presenting on 02/14/2021 for comprehensive medical examination. Current medical complaints include:none  He currently lives with: son Interim Problems from his last visit: no   HYPERTENSION / HYPERLIPIDEMIA Recent NSTEMI on 12/25/20 (inferior myocardial infarction), admitted to hospital and left heart cath performed.  Saw cardiology, Dr. Nehemiah Massed, on 01/08/21 for initial visit.  Taking Carvedilol 3.125 MG BID (started by cardiology at recent visit), ASA, Brilinta, Atorvastatin.  Return to cardiology on 02/25/21.  Had stress test last week and acho on 02/07/21 = EF >55% with normal LV function and mild LVH.  Recently had episode of hypotension after golfing with BP dropping to 98/67 and HR 128, this increased after 5 minutes to 137/68 with HR 57 after sitting and resting.    Recent A1c mild elevation at 5.9% in June 2022. Satisfied with current treatment? yes Duration of hypertension: chronic BP monitoring frequency: daily BP range: 98/67 (after golfing) to 154/79 -- average 130/70  BP medication side effects: no Duration of hyperlipidemia: chronic Cholesterol medication side effects: no Cholesterol supplements: none Medication compliance: good compliance Aspirin: yes Recent stressors: no Recurrent headaches: no Visual changes: no Palpitations: no Dyspnea: no Chest pain: no Lower extremity edema: no Dizzy/lightheaded: no   CHRONIC KIDNEY DISEASE Followed by nephrology, saw them last 11/29/20 with labs and visit.  Sees every 6 months.  Last June labs -- CRT 1.21 and GFR 57, PTH 28.  Saw hematology for anemia with CKD, is currently off  iron tablets -- last saw 02/06/21 -- to follow-up as needed only. CKD status: stable Medications renally dose: yes Previous renal evaluation: yes Pneumovax:  Up to Date Influenza Vaccine:  Up to Date  HYPOTHYROIDISM Was on Levothyroxine 25 MCG, this was discontinued in the hospital. Thyroid control status:stable Satisfied with current treatment? yes Medication side effects: no Medication compliance: good compliance Etiology of hypothyroidism:  Recent dose adjustment:no Fatigue: no Cold intolerance: no Heat intolerance: no Weight gain: no Weight loss: no Constipation: no Diarrhea/loose stools: no Palpitations: no Lower extremity edema: no Anxiety/depressed mood: no   Functional Status Survey: Is the patient deaf or have difficulty hearing?: No Does the patient have difficulty seeing, even when wearing glasses/contacts?: No Does the patient have difficulty concentrating, remembering, or making decisions?: No Does the patient have difficulty walking or climbing stairs?: No Does the patient have difficulty dressing or bathing?: No Does the patient have difficulty doing errands alone such as visiting a doctor's office or shopping?: No  FALL RISK: Fall Risk  02/14/2021 12/25/2020 12/03/2020 08/09/2020 02/07/2020  Falls in the past year? 0 0 0 0 0  Number falls in past yr: 0 0 - - 0  Injury with Fall? 0 0 - - 0  Risk for fall due to : No Fall Risks No Fall Risks Medication side effect - -  Follow up Follow up appointment Falls evaluation completed Falls evaluation completed;Education provided;Falls prevention discussed - Falls evaluation completed    Depression Screen Depression screen [facility] 2/9 02/14/2021 12/25/2020 12/20/2020 12/03/2020 08/09/2020  Decreased  Interest 0 0 0 0 0  Down, Depressed, Hopeless 0 0 0 0 0  PHQ - 2 Score 0 0 0 0 0  Altered sleeping - - - - -  Tired, decreased energy - - - - -  Change in appetite - - - - -  Feeling bad or failure about yourself  - - - - -   Trouble concentrating - - - - -  Moving slowly or fidgety/restless - - - - -  Suicidal thoughts - - - - -  PHQ-9 Score - - - - -  Difficult doing work/chores - - - - -    Advanced Directives <no information>  Past Medical History:  Past Medical History:  Diagnosis Date   Anxiety    Chronic kidney disease    Heart attack ([place])    Hyperlipidemia    Hypertension    Insomnia    Thyroid disease     Surgical History:  Past Surgical History:  Procedure Laterality Date   BACK SURGERY     x2   CARPAL TUNNEL RELEASE Right 03/08/2018   Procedure: CARPAL TUNNEL RELEASE;  Surgeon: Earnestine Leys, MD;  Location: [facility] ORS;  Service: Orthopedics;  Laterality: Right;   COLONOSCOPY WITH PROPOFOL N/A 07/19/2015   Procedure: COLONOSCOPY WITH PROPOFOL;  Surgeon: Manya Silvas, MD;  Location: [facility] ENDOSCOPY;  Service: Endoscopy;  Laterality: N/A;   CORONARY STENT PLACEMENT      Medications:  Current Outpatient Medications on File Prior to Visit  Medication Sig   aspirin 81 MG tablet Take 81 mg by mouth every evening.    atorvastatin (LIPITOR) 80 MG tablet Take 1 tablet by mouth daily at 12 noon.   carvedilol (COREG) 3.125 MG tablet Take 1 tablet by mouth 2 (two) times daily.   ticagrelor (BRILINTA) 90 MG TABS tablet Take 1 tablet by mouth 2 (two) times daily.   No current facility-administered medications on file prior to visit.    Allergies:  No Known Allergies  Social History:  Social History   Socioeconomic History   Marital status: Widowed    Spouse name: Not on file   Number of children: Not on file   Years of education: 12   Highest education level: 12th grade  Occupational History   Occupation: retired   Tobacco Use   Smoking status: Former    Types: Cigarettes    Quit date: 08/08/1970    Years since quitting: 50.5   Smokeless tobacco: Never  Vaping Use   Vaping Use: Never used  Substance and Sexual Activity   Alcohol use: Yes    Alcohol/week: 14.0 standard  drinks    Types: 14 Glasses of wine per week    Comment: 6 oz glasses/ red wine    Drug use: No   Sexual activity: Not Currently  Other Topics Concern   Not on file  Social History Narrative   Not on file   Social Determinants of Health   Financial Resource Strain: Low Risk    Difficulty of Paying Living Expenses: Not hard at all  Food Insecurity: No Food Insecurity   Worried About Charity fundraiser in the Last Year: Never true   Ran Out of Food in the Last Year: Never true  Transportation Needs: No Transportation Needs   Lack of Transportation (Medical): No   Lack of Transportation (Non-Medical): No  Physical Activity: Sufficiently Active   Days of Exercise per Week: 3 days   Minutes of Exercise per Session:  150+ min  Stress: No Stress Concern Present   Feeling of Stress : Not at all  Social Connections: Not on file  Intimate Partner Violence: Not At Risk   Fear of Current or Ex-Partner: No   Emotionally Abused: No   Physically Abused: No   Sexually Abused: No   Social History   Tobacco Use  Smoking Status Former   Types: Cigarettes   Quit date: 08/08/1970   Years since quitting: 50.5  Smokeless Tobacco Never   Social History   Substance and Sexual Activity  Alcohol Use Yes   Alcohol/week: 14.0 standard drinks   Types: 14 Glasses of wine per week   Comment: 6 oz glasses/ red wine     Family History:  Family History  Problem Relation Age of Onset   Hypertension Mother    Heart disease Mother        heart attack   Hypertension Sister    Emphysema Father    Hypertension Daughter    Hypertension Son    Hypertension Sister    Hypertension Sister    Emphysema Sister     Past medical history, surgical history, medications, allergies, family history and social history reviewed with patient today and changes made to appropriate areas of the chart.   Review of Systems - negative All other ROS negative except what is listed above and in the HPI.       Objective:    BP 128/64 (BP Location: Left Arm, Patient Position: Sitting, Cuff Size: Normal)   Pulse 62 Comment: apical  Temp 98 F (36.7 C) (Oral)   Ht '5\' 6"'$  (1.676 m)   Wt 146 lb 9.6 oz (66.5 kg)   SpO2 96%   BMI 23.66 kg/m   Wt Readings from Last 3 Encounters:  02/14/21 146 lb 9.6 oz (66.5 kg)  02/06/21 145 lb 12.8 oz (66.1 kg)  12/25/20 149 lb 9.6 oz (67.9 kg)    Physical Exam Vitals and nursing note reviewed.  Constitutional:      General: He is awake. He is not in acute distress.    Appearance: He is well-developed and well-groomed. He is not ill-appearing.  HENT:     Head: Normocephalic and atraumatic.     Right Ear: Hearing, tympanic membrane, ear canal and external ear normal. No drainage.     Left Ear: Hearing, tympanic membrane, ear canal and external ear normal. No drainage.     Nose: Nose normal.     Mouth/Throat:     Pharynx: Uvula midline.  Eyes:     General: Lids are normal.        Right eye: No discharge.        Left eye: No discharge.     Extraocular Movements: Extraocular movements intact.     Conjunctiva/sclera: Conjunctivae normal.     Pupils: Pupils are equal, round, and reactive to light.     Visual Fields: Right eye visual fields normal and left eye visual fields normal.  Neck:     Thyroid: No thyromegaly.     Vascular: No carotid bruit or JVD.     Trachea: Trachea normal.  Cardiovascular:     Rate and Rhythm: Normal rate and regular rhythm.     Heart sounds: Normal heart sounds, S1 normal and S2 normal. No murmur heard.   No gallop.  Pulmonary:     Effort: Pulmonary effort is normal. No accessory muscle usage or respiratory distress.     Breath sounds: Normal breath sounds.  Abdominal:     General: Bowel sounds are normal.     Palpations: Abdomen is soft. There is no hepatomegaly or splenomegaly.     Tenderness: There is no abdominal tenderness.  Genitourinary:    Comments: Deferred per patient request Musculoskeletal:        General:  Normal range of motion.     Cervical back: Normal range of motion and neck supple.     Right lower leg: No edema.     Left lower leg: No edema.  Lymphadenopathy:     Head:     Right side of head: No submental, submandibular, tonsillar, preauricular or posterior auricular adenopathy.     Left side of head: No submental, submandibular, tonsillar, preauricular or posterior auricular adenopathy.     Cervical: No cervical adenopathy.  Skin:    General: Skin is warm and dry.     Capillary Refill: Capillary refill takes less than 2 seconds.     Findings: No rash.  Neurological:     Mental Status: He is alert and oriented to person, place, and time.     Cranial Nerves: Cranial nerves are intact.     Gait: Gait is intact.     Deep Tendon Reflexes: Reflexes are normal and symmetric.     Reflex Scores:      Brachioradialis reflexes are 2+ on the right side and 2+ on the left side.      Patellar reflexes are 2+ on the right side and 2+ on the left side. Psychiatric:        Attention and Perception: Attention normal.        Mood and Affect: Mood normal.        Speech: Speech normal.        Behavior: Behavior normal. Behavior is cooperative.        Thought Content: Thought content normal.        Cognition and Memory: Cognition normal.        Judgment: Judgment normal.   Results for orders placed or performed in visit on 02/06/21  Iron and TIBC  Result Value Ref Range   Iron 60 45 - 182 ug/dL   TIBC 346 250 - 450 ug/dL   Saturation Ratios 17 (L) 17.9 - 39.5 %   UIBC 286 ug/dL  Ferritin  Result Value Ref Range   Ferritin 29 24 - 336 ng/mL  Basic metabolic panel  Result Value Ref Range   Sodium 137 135 - 145 mmol/L   Potassium 4.0 3.5 - 5.1 mmol/L   Chloride 106 98 - 111 mmol/L   CO2 23 22 - 32 mmol/L   Glucose, Bld 96 70 - 99 mg/dL   BUN 22 8 - 23 mg/dL   Creatinine, Ser 1.27 (H) 0.61 - 1.24 mg/dL   Calcium 8.8 (L) 8.9 - 10.3 mg/dL   GFR, Estimated 57 (L) >60 mL/min   Anion gap 8 5  - 15  CBC with Differential  Result Value Ref Range   WBC 6.8 4.0 - 10.5 K/uL   RBC 4.98 4.22 - 5.81 MIL/uL   Hemoglobin 13.2 13.0 - 17.0 g/dL   HCT 40.3 39.0 - 52.0 %   MCV 80.9 80.0 - 100.0 fL   MCH 26.5 26.0 - 34.0 pg   MCHC 32.8 30.0 - 36.0 g/dL   RDW 13.5 11.5 - 15.5 %   Platelets 288 150 - 400 K/uL   nRBC 0.0 0.0 - 0.2 %   Neutrophils Relative % 61 %  Neutro Abs 4.2 1.7 - 7.7 K/uL   Lymphocytes Relative 25 %   Lymphs Abs 1.7 0.7 - 4.0 K/uL   Monocytes Relative 9 %   Monocytes Absolute 0.6 0.1 - 1.0 K/uL   Eosinophils Relative 4 %   Eosinophils Absolute 0.3 0.0 - 0.5 K/uL   Basophils Relative 1 %   Basophils Absolute 0.1 0.0 - 0.1 K/uL   Immature Granulocytes 0 %   Abs Immature Granulocytes 0.02 0.00 - 0.07 K/uL      Assessment & Plan:   Problem List Items Addressed This Visit       Cardiovascular and Mediastinum   Hypertension    Chronic, ongoing with initial BP elevated on exam today, but repeat at goal and closer to home readings.  Average at home 130/70 with occasional higher or lower readings.  Had one episode recently of hypotension post golf game, recommend he wear compression hose and drink plenty of fluid when out on course + play shorter game if not feeling well.  At this time continue current medication regimen and adjust as needed. Recommend continue to monitor BP at home regularly and document for provider + focus on DASH diet.  Kidney function labs + CBC up to date with nephrology recently.  Return in 6 months, continue to collaborate with cardiology.      Relevant Orders   Microalbumin, Urine Waived   Coronary artery disease involving native coronary artery of native heart    NSTEMI, (inferior myocardial infarction), on 12/25/20 with heart cath.  At this time continue current medication regimen and collaboration with cardiology.  Lipid panel today.        Endocrine   IFG (impaired fasting glucose)    Recent A1c in June 2022 was 5.9%, recommend diet  focus and plan to recheck next visit.        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  Continue collaboration with hematology as needed only and nephrology as scheduled + current medication regimen.        CKD (chronic kidney disease) - Primary    Chronic, ongoing, followed by nephrology.  Continue this collaboration and current medication regimen, may benefit return to ACE or ARB in future, this was discontinued in June 2022 due to NSTEMI.   Kidney function labs up to date with nephrology recently, note reviewed and labs showing improvement.  Urine ALB today.  Return in 6 months.        Other   Hyperlipidemia    Chronic, ongoing.  Continue current medication regimen and adjust as needed.  Lipid panel today.      Relevant Orders   Lipid Panel w/o Chol/HDL Ratio   Elevated TSH    Noted on labs with some occasional constipation -- recheck labs today: TSH, antibody, and Free T4.  Consider restarting medication, has Levothyroxine 25 MCG left at home, as was discontinued in hospital.      Relevant Orders   T4, free   TSH   Thyroid peroxidase antibody   History of non-ST elevation myocardial infarction (NSTEMI)    On 12/25/20, (inferior myocardial infarction).  Continue collaboration with cardiology and current medication regimen as prescribed by them.  Lipid panel today.      Health care maintenance    Health maintenance reviewed with patient today: - Colonoscopy = wishes to hold off on this until new year due to  recent NSTEMI - Tetanus = up to date and due next 08/09/2030 - Shingrix = wishes to check with his insurance      Other Visit Diagnoses     Benign prostatic hyperplasia without lower urinary tract symptoms       PSA on labs today   Relevant Orders   PSA   Annual physical exam       Annual physical with labs today and health maintenance reviewed.       Discussed aspirin  prophylaxis for myocardial infarction prevention and decision was made to continue ASA  LABORATORY TESTING:  Health maintenance labs ordered today as discussed above.   The natural history of prostate cancer and ongoing controversy regarding screening and potential treatment outcomes of prostate cancer has been discussed with the patient. The meaning of a false positive PSA and a false negative PSA has been discussed. He indicates understanding of the limitations of this screening test and wishes not to proceed with screening PSA testing.   IMMUNIZATIONS:   - Tdap: Tetanus vaccination status reviewed: Up To Date - Influenza: Up to date - Pneumovax: Up to date - Prevnar: Up to date - Zostavax vaccine: Not Up to date -- he wishes to check with insurance  SCREENING: - Colonoscopy: Not Up To Date -- wants to hold off due to recent NSTEMI Discussed with patient purpose of the colonoscopy is to detect colon cancer at curable precancerous or early stages   - AAA Screening: Not applicable  -Hearing Test: Not applicable  -Spirometry: Not applicable   PATIENT COUNSELING:    Sexuality: Discussed sexually transmitted diseases, partner selection, use of condoms, avoidance of unintended pregnancy  and contraceptive alternatives.   Advised to avoid cigarette smoking.  I discussed with the patient that most people either abstain from alcohol or drink within safe limits (<=14/week and <=4 drinks/occasion for males, <=7/weeks and <= 3 drinks/occasion for females) and that the risk for alcohol disorders and other health effects rises proportionally with the number of drinks per week and how often a drinker exceeds daily limits.  Discussed cessation/primary prevention of drug use and availability of treatment for abuse.   Diet: Encouraged to adjust caloric intake to maintain  or achieve ideal body weight, to reduce intake of dietary saturated fat and total fat, to limit sodium intake by avoiding high  sodium foods and not adding table salt, and to maintain adequate dietary potassium and calcium preferably from fresh fruits, vegetables, and low-fat dairy products.    Stressed the importance of regular exercise  Injury prevention: Discussed safety belts, safety helmets, smoke detector, smoking near bedding or upholstery.   Dental health: Discussed importance of regular tooth brushing, flossing, and dental visits.   Follow up plan: NEXT PREVENTATIVE PHYSICAL DUE IN 1 YEAR. Return in about 6 months (around 08/17/2021) for HTN/HLD, ELEVATED TSH, CKD.

## 2021-02-14 NOTE — Assessment & Plan Note (Signed)
Chronic, stable at this time with improved labs in August 2022.  Continue collaboration with hematology as needed only and nephrology as scheduled + current medication regimen.   

## 2021-02-14 NOTE — Patient Instructions (Signed)
https://www.nhlbi.nih.gov/files/docs/public/heart/dash_brief.pdf">  DASH Eating Plan DASH stands for Dietary Approaches to Stop Hypertension. The DASH eating plan is a healthy eating plan that has been shown to: Reduce high blood pressure (hypertension). Reduce your risk for type 2 diabetes, heart disease, and stroke. Help with weight loss. What are tips for following this plan? Reading food labels Check food labels for the amount of salt (sodium) per serving. Choose foods with less than 5 percent of the Daily Value of sodium. Generally, foods with less than 300 milligrams (mg) of sodium per serving fit into this eating plan. To find whole grains, look for the word "whole" as the first word in the ingredient list. Shopping Buy products labeled as "low-sodium" or "no salt added." Buy fresh foods. Avoid canned foods and pre-made or frozen meals. Cooking Avoid adding salt when cooking. Use salt-free seasonings or herbs instead of table salt or sea salt. Check with your health care provider or pharmacist before using salt substitutes. Do not fry foods. Cook foods using healthy methods such as baking, boiling, grilling, roasting, and broiling instead. Cook with heart-healthy oils, such as olive, canola, avocado, soybean, or sunflower oil. Meal planning  Eat a balanced diet that includes: 4 or more servings of fruits and 4 or more servings of vegetables each day. Try to fill one-half of your plate with fruits and vegetables. 6-8 servings of whole grains each day. Less than 6 oz (170 g) of lean meat, poultry, or fish each day. A 3-oz (85-g) serving of meat is about the same size as a deck of cards. One egg equals 1 oz (28 g). 2-3 servings of low-fat dairy each day. One serving is 1 cup (237 mL). 1 serving of nuts, seeds, or beans 5 times each week. 2-3 servings of heart-healthy fats. Healthy fats called omega-3 fatty acids are found in foods such as walnuts, flaxseeds, fortified milks, and eggs.  These fats are also found in cold-water fish, such as sardines, salmon, and mackerel. Limit how much you eat of: Canned or prepackaged foods. Food that is high in trans fat, such as some fried foods. Food that is high in saturated fat, such as fatty meat. Desserts and other sweets, sugary drinks, and other foods with added sugar. Full-fat dairy products. Do not salt foods before eating. Do not eat more than 4 egg yolks a week. Try to eat at least 2 vegetarian meals a week. Eat more home-cooked food and less restaurant, buffet, and fast food.  Lifestyle When eating at a restaurant, ask that your food be prepared with less salt or no salt, if possible. If you drink alcohol: Limit how much you use to: 0-1 drink a day for women who are not pregnant. 0-2 drinks a day for men. Be aware of how much alcohol is in your drink. In the [place] one drink equals one 12 oz bottle of beer (355 mL), one 5 oz glass of wine (148 mL), or one 1 oz glass of hard liquor (44 mL). General information Avoid eating more than 2,300 mg of salt a day. If you have hypertension, you may need to reduce your sodium intake to 1,500 mg a day. Work with your health care provider to maintain a healthy body weight or to lose weight. Ask what an ideal weight is for you. Get at least 30 minutes of exercise that causes your heart to beat faster (aerobic exercise) most days of the week. Activities may include walking, swimming, or biking. Work with your health care provider   or dietitian to adjust your eating plan to your individual calorie needs. What foods should I eat? Fruits All fresh, dried, or frozen fruit. Canned fruit in natural juice (without addedsugar). Vegetables Fresh or frozen vegetables (raw, steamed, roasted, or grilled). Low-sodium or reduced-sodium tomato and vegetable juice. Low-sodium or reduced-sodium tomatosauce and tomato paste. Low-sodium or reduced-sodium canned vegetables. Grains Whole-grain or  whole-wheat bread. Whole-grain or whole-wheat pasta. Brown rice. Oatmeal. Quinoa. Bulgur. Whole-grain and low-sodium cereals. Pita bread.Low-fat, low-sodium crackers. Whole-wheat flour tortillas. Meats and other proteins Skinless chicken or turkey. Ground chicken or turkey. Pork with fat trimmed off. Fish and seafood. Egg whites. Dried beans, peas, or lentils. Unsalted nuts, nut butters, and seeds. Unsalted canned beans. Lean cuts of beef with fat trimmed off. Low-sodium, lean precooked or cured meat, such as sausages or meatloaves. Dairy Low-fat (1%) or fat-free (skim) milk. Reduced-fat, low-fat, or fat-free cheeses. Nonfat, low-sodium ricotta or cottage cheese. Low-fat or nonfatyogurt. Low-fat, low-sodium cheese. Fats and oils Soft margarine without trans fats. Vegetable oil. Reduced-fat, low-fat, or light mayonnaise and salad dressings (reduced-sodium). Canola, safflower, olive, avocado, soybean, andsunflower oils. Avocado. Seasonings and condiments Herbs. Spices. Seasoning mixes without salt. Other foods Unsalted popcorn and pretzels. Fat-free sweets. The items listed above may not be a complete list of foods and beverages you can eat. Contact a dietitian for more information. What foods should I avoid? Fruits Canned fruit in a light or heavy syrup. Fried fruit. Fruit in cream or buttersauce. Vegetables Creamed or fried vegetables. Vegetables in a cheese sauce. Regular canned vegetables (not low-sodium or reduced-sodium). Regular canned tomato sauce and paste (not low-sodium or reduced-sodium). Regular tomato and vegetable juice(not low-sodium or reduced-sodium). Pickles. Olives. Grains Baked goods made with fat, such as croissants, muffins, or some breads. Drypasta or rice meal packs. Meats and other proteins Fatty cuts of meat. Ribs. Fried meat. Bacon. Bologna, salami, and other precooked or cured meats, such as sausages or meat loaves. Fat from the back of a pig (fatback). Bratwurst.  Salted nuts and seeds. Canned beans with added salt. Canned orsmoked fish. Whole eggs or egg yolks. Chicken or turkey with skin. Dairy Whole or 2% milk, cream, and half-and-half. Whole or full-fat cream cheese. Whole-fat or sweetened yogurt. Full-fat cheese. Nondairy creamers. Whippedtoppings. Processed cheese and cheese spreads. Fats and oils Butter. Stick margarine. Lard. Shortening. Ghee. Bacon fat. Tropical oils, suchas coconut, palm kernel, or palm oil. Seasonings and condiments Onion salt, garlic salt, seasoned salt, table salt, and sea salt. Worcestershire sauce. Tartar sauce. Barbecue sauce. Teriyaki sauce. Soy sauce, including reduced-sodium. Steak sauce. Canned and packaged gravies. Fish sauce. Oyster sauce. Cocktail sauce. Store-bought horseradish. Ketchup. Mustard. Meat flavorings and tenderizers. Bouillon cubes. Hot sauces. Pre-made or packaged marinades. Pre-made or packaged taco seasonings. Relishes. Regular saladdressings. Other foods Salted popcorn and pretzels. The items listed above may not be a complete list of foods and beverages you should avoid. Contact a dietitian for more information. Where to find more information National Heart, Lung, and Blood Institute: www.nhlbi.nih.gov American Heart Association: www.heart.org Academy of Nutrition and Dietetics: www.eatright.org National Kidney Foundation: www.kidney.org Summary The DASH eating plan is a healthy eating plan that has been shown to reduce high blood pressure (hypertension). It may also reduce your risk for type 2 diabetes, heart disease, and stroke. When on the DASH eating plan, aim to eat more fresh fruits and vegetables, whole grains, lean proteins, low-fat dairy, and heart-healthy fats. With the DASH eating plan, you should limit salt (sodium) intake to 2,300   mg a day. If you have hypertension, you may need to reduce your sodium intake to 1,500 mg a day. Work with your health care provider or dietitian to adjust  your eating plan to your individual calorie needs. This information is not intended to replace advice given to you by your health care provider. Make sure you discuss any questions you have with your healthcare provider. Document Revised: 05/20/2019 Document Reviewed: 05/20/2019 Elsevier Patient Education  2022 Elsevier Inc.  

## 2021-02-14 NOTE — Assessment & Plan Note (Signed)
Recent A1c in June 2022 was 5.9%, recommend diet focus and plan to recheck next visit.

## 2021-02-14 NOTE — Assessment & Plan Note (Signed)
Chronic, ongoing.  Continue current medication regimen and adjust as needed. Lipid panel today. 

## 2021-02-14 NOTE — Assessment & Plan Note (Signed)
NSTEMI, (inferior myocardial infarction), on 12/25/20 with heart cath.  At this time continue current medication regimen and collaboration with cardiology.  Lipid panel today. 

## 2021-02-15 LAB — PSA: Prostate Specific Ag, Serum: 2 ng/mL (ref 0.0–4.0)

## 2021-02-15 LAB — T4, FREE: Free T4: 1.14 ng/dL (ref 0.82–1.77)

## 2021-02-15 LAB — LIPID PANEL W/O CHOL/HDL RATIO
Cholesterol, Total: 135 mg/dL (ref 100–199)
HDL: 44 mg/dL (ref >39)
LDL Chol Calc (NIH): 77 mg/dL (ref 0–99)
Triglycerides: 67 mg/dL (ref 0–149)
VLDL Cholesterol Cal: 14 mg/dL (ref 5–40)

## 2021-02-15 LAB — TSH: TSH: 6.75 u[IU]/mL — ABNORMAL HIGH (ref 0.450–4.500)

## 2021-02-15 LAB — THYROID PEROXIDASE ANTIBODY: Thyroperoxidase Ab SerPl-aCnc: <8 IU/mL (ref 0–34)

## 2021-02-15 MED ORDER — LEVOTHYROXINE SODIUM 25 MCG PO TABS: 25.0000 ug | ORAL_TABLET | Freq: Every day | ORAL | 3 refills | Status: DC

## 2021-02-15 NOTE — Addendum Note (Signed)
Addended by: Marnee Guarneri T on: 02/15/2021 07:54 PM   Modules accepted: Orders

## 2021-02-15 NOTE — Progress Notes (Signed)
Contacted via [place] -- please call Monday and ensure he received below message -- admin crew please call patient Monday and schedule 6 week follow-up: Good evening Roger Byrd, your labs have returned.  Cholesterol levels are improving and trending down, continue current medications.  Prostate lab is normal.  Thyroid lab is trending up and now above 6, at this time I would like to start you on thyroid medicine.  I am going to send in Levothyroxine 25 MCG, please start taking this every morning 30 minutes prior to other medications and food.  I would like you to return in 6 weeks for visit and lab recheck.  Any questions? Keep being awesome!!  Thank you for allowing me to participate in your care.  I appreciate you. Kindest regards, Roger Byrd

## 2021-02-19 ENCOUNTER — Ambulatory Visit: Payer: Medicare Other | Admitting: Nurse Practitioner

## 2021-02-25 DIAGNOSIS — I2510 Atherosclerotic heart disease of native coronary artery without angina pectoris: Secondary | ICD-10-CM | POA: Diagnosis not present

## 2021-02-25 DIAGNOSIS — I10 Essential (primary) hypertension: Secondary | ICD-10-CM | POA: Diagnosis not present

## 2021-02-25 DIAGNOSIS — E782 Mixed hyperlipidemia: Secondary | ICD-10-CM | POA: Diagnosis not present

## 2021-03-11 ENCOUNTER — Emergency Department: Payer: Medicare Other

## 2021-03-11 ENCOUNTER — Other Ambulatory Visit: Payer: Self-pay

## 2021-03-11 ENCOUNTER — Observation Stay
Admission: EM | Admit: 2021-03-11 | Discharge: 2021-03-12 | Disposition: A | Discharge status: Home / Self Care | Payer: Medicare Other | Attending: Internal Medicine | Admitting: Internal Medicine

## 2021-03-11 DIAGNOSIS — I129 Hypertensive chronic kidney disease with stage 1 through stage 4 chronic kidney disease, or unspecified chronic kidney disease: Secondary | ICD-10-CM | POA: Diagnosis not present

## 2021-03-11 DIAGNOSIS — I10 Essential (primary) hypertension: Secondary | ICD-10-CM

## 2021-03-11 DIAGNOSIS — N1831 Chronic kidney disease, stage 3a: Secondary | ICD-10-CM | POA: Diagnosis not present

## 2021-03-11 DIAGNOSIS — Z87891 Personal history of nicotine dependence: Secondary | ICD-10-CM | POA: Insufficient documentation

## 2021-03-11 DIAGNOSIS — I2510 Atherosclerotic heart disease of native coronary artery without angina pectoris: Secondary | ICD-10-CM | POA: Diagnosis not present

## 2021-03-11 DIAGNOSIS — Z79899 Other long term (current) drug therapy: Secondary | ICD-10-CM | POA: Diagnosis not present

## 2021-03-11 DIAGNOSIS — I471 Supraventricular tachycardia: Secondary | ICD-10-CM

## 2021-03-11 DIAGNOSIS — Z20822 Contact with and (suspected) exposure to covid-19: Secondary | ICD-10-CM | POA: Insufficient documentation

## 2021-03-11 DIAGNOSIS — Z955 Presence of coronary angioplasty implant and graft: Secondary | ICD-10-CM | POA: Diagnosis not present

## 2021-03-11 DIAGNOSIS — R079 Chest pain, unspecified: Secondary | ICD-10-CM | POA: Diagnosis present

## 2021-03-11 DIAGNOSIS — R0789 Other chest pain: Principal | ICD-10-CM | POA: Insufficient documentation

## 2021-03-11 DIAGNOSIS — Z7982 Long term (current) use of aspirin: Secondary | ICD-10-CM | POA: Insufficient documentation

## 2021-03-11 DIAGNOSIS — E039 Hypothyroidism, unspecified: Secondary | ICD-10-CM | POA: Insufficient documentation

## 2021-03-11 DIAGNOSIS — R000 Tachycardia, unspecified: Secondary | ICD-10-CM

## 2021-03-11 HISTORY — DX: Hypothyroidism, unspecified: E03.9

## 2021-03-11 HISTORY — DX: Cardiac arrhythmia, unspecified: I49.9

## 2021-03-11 HISTORY — DX: Atherosclerotic heart disease of native coronary artery without angina pectoris: I25.10

## 2021-03-11 LAB — BASIC METABOLIC PANEL
Anion gap: 10 (ref 5–15)
BUN: 27 mg/dL — ABNORMAL HIGH (ref 8–23)
CO2: 24 mmol/L (ref 22–32)
Calcium: 9.2 mg/dL (ref 8.9–10.3)
Chloride: 106 mmol/L (ref 98–111)
Creatinine, Ser: 1.45 mg/dL — ABNORMAL HIGH (ref 0.61–1.24)
GFR, Estimated: 49 mL/min — ABNORMAL LOW (ref >60)
Glucose, Bld: 136 mg/dL — ABNORMAL HIGH (ref 70–99)
Potassium: 3.8 mmol/L (ref 3.5–5.1)
Sodium: 140 mmol/L (ref 135–145)

## 2021-03-11 LAB — PROTIME-INR
INR: 1.1 (ref 0.8–1.2)
Prothrombin Time: 13.7 seconds (ref 11.4–15.2)

## 2021-03-11 LAB — CBC
HCT: 43.1 % (ref 39.0–52.0)
Hemoglobin: 14.2 g/dL (ref 13.0–17.0)
MCH: 26.7 pg (ref 26.0–34.0)
MCHC: 32.9 g/dL (ref 30.0–36.0)
MCV: 81.2 fL (ref 80.0–100.0)
Platelets: 335 10*3/uL (ref 150–400)
RBC: 5.31 MIL/uL (ref 4.22–5.81)
RDW: 13.9 % (ref 11.5–15.5)
WBC: 7.7 10*3/uL (ref 4.0–10.5)
nRBC: 0 % (ref 0.0–0.2)

## 2021-03-11 LAB — HEPATIC FUNCTION PANEL
ALT: 32 U/L (ref 0–44)
AST: 24 U/L (ref 15–41)
Albumin: 3.7 g/dL (ref 3.5–5.0)
Alkaline Phosphatase: 48 U/L (ref 38–126)
Bilirubin, Direct: <0.1 mg/dL (ref 0.0–0.2)
Total Bilirubin: 0.4 mg/dL (ref 0.3–1.2)
Total Protein: 6.3 g/dL — ABNORMAL LOW (ref 6.5–8.1)

## 2021-03-11 LAB — APTT: aPTT: 30 seconds (ref 24–36)

## 2021-03-11 LAB — TROPONIN I (HIGH SENSITIVITY)
Troponin I (High Sensitivity): 19 ng/L — ABNORMAL HIGH (ref <18)
Troponin I (High Sensitivity): 33 ng/L — ABNORMAL HIGH (ref <18)

## 2021-03-11 MED ORDER — IRBESARTAN 75 MG PO TABS
37.5000 mg | ORAL_TABLET | Freq: Every day | ORAL | Status: DC
  Administered 2021-03-12: 37.5 mg via ORAL
  Filled 2021-03-11: qty 0.5

## 2021-03-11 MED ORDER — ACETAMINOPHEN 650 MG RE SUPP: 650.0000 mg | Freq: Four times a day (QID) | RECTAL | Status: DC | PRN

## 2021-03-11 MED ORDER — ACETAMINOPHEN 325 MG PO TABS: 650.0000 mg | ORAL_TABLET | Freq: Four times a day (QID) | ORAL | Status: DC | PRN

## 2021-03-11 MED ORDER — NITROGLYCERIN 0.4 MG SL SUBL: 0.4000 mg | SUBLINGUAL_TABLET | SUBLINGUAL | Status: DC | PRN

## 2021-03-11 MED ORDER — LEVOTHYROXINE SODIUM 25 MCG PO TABS
25.0000 ug | ORAL_TABLET | Freq: Every day | ORAL | Status: DC
  Administered 2021-03-12: 25 ug via ORAL
  Filled 2021-03-11: qty 1

## 2021-03-11 MED ORDER — MORPHINE SULFATE (PF) 2 MG/ML IV SOLN
2.0000 mg | INTRAVENOUS | Status: DC | PRN
Start: 2021-03-11 — End: 2021-03-12

## 2021-03-11 MED ORDER — DILTIAZEM HCL 25 MG/5ML IV SOLN
10.0000 mg | Freq: Once | INTRAVENOUS | Status: AC
  Administered 2021-03-11: 10 mg via INTRAVENOUS
  Filled 2021-03-11: qty 5

## 2021-03-11 MED ORDER — ATORVASTATIN CALCIUM 80 MG PO TABS
80.0000 mg | ORAL_TABLET | Freq: Every day | ORAL | Status: DC
  Filled 2021-03-11: qty 1

## 2021-03-11 MED ORDER — METOPROLOL TARTRATE 25 MG PO TABS: 12.5000 mg | ORAL_TABLET | Freq: Two times a day (BID) | ORAL | 0 refills | Status: DC

## 2021-03-11 MED ORDER — MAGNESIUM HYDROXIDE 400 MG/5ML PO SUSP
30.0000 mL | Freq: Every day | ORAL | Status: DC | PRN
Start: 2021-03-11 — End: 2021-03-12

## 2021-03-11 MED ORDER — ONDANSETRON HCL 4 MG/2ML IJ SOLN: 4.0000 mg | Freq: Four times a day (QID) | INTRAMUSCULAR | Status: DC | PRN

## 2021-03-11 MED ORDER — ENOXAPARIN SODIUM 40 MG/0.4ML IJ SOSY
40.0000 mg | PREFILLED_SYRINGE | Freq: Every day | INTRAMUSCULAR | Status: DC
  Filled 2021-03-11: qty 0.4

## 2021-03-11 MED ORDER — CARVEDILOL 3.125 MG PO TABS
3.1250 mg | ORAL_TABLET | Freq: Two times a day (BID) | ORAL | Status: DC
  Administered 2021-03-12: 3.125 mg via ORAL
  Filled 2021-03-11: qty 1

## 2021-03-11 MED ORDER — ASPIRIN EC 81 MG PO TBEC
81.0000 mg | DELAYED_RELEASE_TABLET | Freq: Every evening | ORAL | Status: DC
  Administered 2021-03-12: 81 mg via ORAL
  Filled 2021-03-11: qty 1

## 2021-03-11 MED ORDER — SODIUM CHLORIDE 0.9 % IV SOLN: INTRAVENOUS | Status: DC

## 2021-03-11 MED ORDER — TICAGRELOR 90 MG PO TABS
90.0000 mg | ORAL_TABLET | Freq: Two times a day (BID) | ORAL | Status: DC
  Administered 2021-03-12 (×2): 90 mg via ORAL
  Filled 2021-03-11 (×2): qty 1

## 2021-03-11 MED ORDER — SODIUM CHLORIDE 0.9 % IV BOLUS
500.0000 mL | Freq: Once | INTRAVENOUS | Status: AC
  Administered 2021-03-11: 500 mL via INTRAVENOUS

## 2021-03-11 MED ORDER — TRAZODONE HCL 50 MG PO TABS: 25.0000 mg | ORAL_TABLET | Freq: Every evening | ORAL | Status: DC | PRN

## 2021-03-11 MED ORDER — ONDANSETRON HCL 4 MG PO TABS: 4.0000 mg | ORAL_TABLET | Freq: Four times a day (QID) | ORAL | Status: DC | PRN

## 2021-03-11 NOTE — ED Notes (Signed)
Care transferred, report received from [place], [place]

## 2021-03-11 NOTE — ED Provider Notes (Signed)
EKG is reviewed inter by me at 1825 Heart rate 60 Cures 120 QTc 460 Normal sinus rhythm, occasional PAC.  No evidence of acute ischemia or ectopy otherwise denoted   Delman Kitten, MD 03/11/21 1829

## 2021-03-11 NOTE — ED Triage Notes (Signed)
Pt to ED for chest tightness that started today with shob. Denies n/v.  Hx of stent in June. Tachy in triage

## 2021-03-11 NOTE — Discharge Instructions (Signed)
Follow-up with Dr. Nehemiah Massed.  Please call in the morning for an appointment Continue your regular medications Dr. Nehemiah Massed wants to add metoprolol 12.5 mg daily

## 2021-03-11 NOTE — ED Provider Notes (Signed)
[facility] Emergency Department Provider Note  ____________________________________________   Event Date/Time   First MD Initiated Contact with Patient 03/11/21 1705     (approximate)  I have reviewed the triage vital signs and the nursing notes.   HISTORY  Chief Complaint Chest Pain    HPI Roger Byrd is a 80 y.o. male presents emergency department complaining of chest tightness and rapid heart rate.  Patient states symptoms started today and had some shortness of breath.  He denies chest pain.  States is just tight.  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   Hypothyroidism 02/08/2020   Low back pain 02/16/2019   Carpal tunnel syndrome 10/26/2017   IFG (impaired fasting glucose) 09/14/2017   Advanced care planning/counseling discussion 05/27/2016   CKD (chronic kidney disease) 05/01/2015   Hypertension 04/30/2015   Hyperlipidemia 04/30/2015   Anemia due to stage 3a chronic kidney disease ([place]) 04/30/2015    Past Surgical History:  Procedure Laterality Date   BACK SURGERY     x2   CARPAL TUNNEL RELEASE Right 03/08/2018   Procedure: CARPAL TUNNEL RELEASE;  Surgeon: Earnestine Leys, MD;  Location: [facility] ORS;  Service: Orthopedics;  Laterality: Right;   COLONOSCOPY WITH PROPOFOL N/A 07/19/2015   Procedure: COLONOSCOPY WITH PROPOFOL;  Surgeon: Manya Silvas, MD;  Location: [facility] ENDOSCOPY;  Service: Endoscopy;  Laterality: N/A;   CORONARY STENT PLACEMENT      Prior to Admission medications   Medication Sig Start Date End Date  Taking? Authorizing Provider  aspirin 81 MG tablet Take 81 mg by mouth every evening.    Yes [provider]  atorvastatin (LIPITOR) 80 MG tablet Take 1 tablet by mouth daily at 12 noon. 05/01/15  Yes [provider]  carvedilol (COREG) 3.125 MG tablet Take 1 tablet by mouth 2 (two) times daily. 01/08/21 01/08/22 Yes [provider]  levothyroxine (SYNTHROID) 25 MCG tablet Take 1 tablet (25 mcg total) by mouth daily. 02/15/21  Yes Cannady, Henrine Screws T, NP  ticagrelor (BRILINTA) 90 MG TABS tablet Take 1 tablet by mouth 2 (two) times daily. 12/27/20  Yes [provider]  valsartan (DIOVAN) 80 MG tablet Take 40 mg by mouth at bedtime.   Yes [provider]    Allergies Patient has no known allergies.  Family History  Problem Relation Age of Onset   Hypertension Mother    Heart disease Mother        heart attack   Hypertension Sister    Emphysema Father    Hypertension Daughter    Hypertension Son    Hypertension Sister    Hypertension Sister    Emphysema Sister     Social History Social History   Tobacco Use   Smoking status: Former    Types: Cigarettes    Quit date: 08/08/1970    Years since quitting: 50.6   Smokeless tobacco: Never  Vaping Use   Vaping Use: Never used  Substance Use Topics   Alcohol use: Yes    Alcohol/week: 14.0 standard  drinks    Types: 14 Glasses of wine per week    Comment: 6 oz glasses/ red wine    Drug use: No    Review of Systems  Constitutional: No fever/chills Eyes: No visual changes. ENT: No sore throat. Respiratory: Denies cough Cardiovascular: Denies chest tightness, denies pain, complains of rapid heart rate Gastrointestinal: Denies abdominal pain Genitourinary: Negative for dysuria. Musculoskeletal: Negative for back pain. Skin: Negative for rash. Psychiatric: no mood changes,     ____________________________________________   PHYSICAL EXAM:  VITAL SIGNS: ED Triage Vitals  Enc Vitals  Group     BP 03/11/21 1659 111/76     Pulse Rate 03/11/21 1659 (!) 140     Resp 03/11/21 1659 20     Temp 03/11/21 1659 (!) 97.5 F (36.4 C)     Temp Source 03/11/21 1659 Oral     SpO2 03/11/21 1659 100 %     Weight 03/11/21 1702 146 lb (66.2 kg)     Height 03/11/21 1702 '5\' 8"'$  (1.727 m)     Head Circumference --      Peak Flow --      Pain Score 03/11/21 1701 3     Pain Loc --      Pain Edu? --      Excl. in [place]? --     Constitutional: Alert and oriented. Well appearing and in no acute distress. Eyes: Conjunctivae are normal.  Head: Atraumatic. Nose: No congestion/rhinnorhea. Mouth/Throat: Mucous membranes are moist.   Neck:  supple no lymphadenopathy noted Cardiovascular: Tachycardic regular rhythm. Heart sounds are normal Respiratory: Normal respiratory effort.  No retractions, lungs c t a  Abd: soft nontender bs normal all 4 quad GU: deferred Musculoskeletal: FROM all extremities, warm and well perfused Neurologic:  Normal speech and language.  Skin:  Skin is warm, dry and intact. No rash noted. Psychiatric: Mood and affect are normal. Speech and behavior are normal.  ____________________________________________   LABS (all labs ordered are listed, but only abnormal results are displayed)  Labs Reviewed  BASIC METABOLIC PANEL - Abnormal; Notable for the following components:      Result Value   Glucose, Bld 136 (*)    BUN 27 (*)    Creatinine, Ser 1.45 (*)    GFR, Estimated 49 (*)    All other components within normal limits  TROPONIN I (HIGH SENSITIVITY) - Abnormal; Notable for the following components:   Troponin I (High Sensitivity) 19 (*)    All other components within normal limits  CBC  HEPATIC FUNCTION PANEL  PROTIME-INR  APTT  TROPONIN I (HIGH SENSITIVITY)   ____________________________________________   ____________________________________________  RADIOLOGY  Chest  x-ray  ____________________________________________   PROCEDURES  Procedure(s) performed: EKG shows tachycardia at 137, see physician read by Dr. Jacqualine Code  Procedures    ____________________________________________   INITIAL IMPRESSION / ASSESSMENT AND PLAN / ED COURSE  Pertinent labs & imaging results that were available during my care of the patient were reviewed by me and considered in my medical decision making (see chart for details).    The patient is a 80 year old male presents emergency department with rapid heart rate and chest tightness.  See HPI.  Patient has history of stent in June 2022.  His cardiologist is Dr. Nehemiah Massed.  Patient appears to be stable although he is very tachycardic.  DDx: A. fib, atrial flutter, sinus tachycardia, PE  Labs are reassuring, CBC and metabolic panel are in the patient's normal trend.  Troponin is minimally  elevated at 19, will await second troponin  Chest x-ray  Consult cardiology.  Dr. Alveria Apley states looks like atrial fib or atrial flutter and recommends Cardizem IV.  Thinks the patient will convert back to normal sinus rhythm just with medication.   Patient was given Cardizem 1010 mg IV.  Chest x-ray reviewed by me confirmed by radiology to be negative for any acute abnormality  Converted back to normal sinus rhythm, second EKG read by Dr. Jacqualine Code.  See physician read  Care transferred to Dr. Jacqualine Code.  Awaiting second troponin.  Roger Byrd was evaluated in Emergency Department on 03/11/2021 for the symptoms described in the history of present illness. He was evaluated in the context of the global COVID-19 pandemic, which necessitated consideration that the patient might be at risk for infection with the SARS-CoV-2 virus that causes COVID-19. Institutional protocols and algorithms that pertain to the evaluation of patients at risk for COVID-19 are in a state of rapid change based on information released by regulatory bodies including  the CDC and federal and state organizations. These policies and algorithms were followed during the patient's care in the ED.    As part of my medical decision making, I reviewed the following data within the [place] History obtained from family, Nursing notes reviewed and incorporated, Labs reviewed , EKG interpreted tachycardia, Old chart reviewed, Patient signed out to Dr. Jacqualine Code, Radiograph reviewed , A consult was requested and obtained from this/these consultant(s) Cardiology, Notes from prior ED visits, and [place] Controlled Substance Database  ____________________________________________   FINAL CLINICAL IMPRESSION(S) / ED DIAGNOSES  Final diagnoses:  Tachycardia      NEW MEDICATIONS STARTED DURING THIS VISIT:  New Prescriptions   No medications on file     Note:  This document was prepared using Dragon voice recognition software and may include unintentional dictation errors.    Versie Starks, PA-C 03/11/21 Kallie Edward, MD 03/12/21 [phone]

## 2021-03-11 NOTE — ED Provider Notes (Signed)
EKG from 1700 reviewed inter by me, heart rate 140 QRS 100 QTc 500 Supraventricular tachycardia.  Unclear underlying rhythm, possibly SVT, seems quite regular.  Flutter also considered possibility.  No evidence of acute ischemia denoted however   Delman Kitten, MD 03/11/21 [phone]

## 2021-03-11 NOTE — ED Provider Notes (Signed)
Admission discussed with Dr. Sidney Ace.  Patient remains asymptomatic in the ED, but on second troponin his troponin is not elevated.  Currently asymptomatic but given his symptoms associated palpitations as well as associated chest pressure and recent coronary procedure will admit for further care and management and observation.  Cardiac rule out.  Both patient and his son understand agreeable with this plan   Delman Kitten, MD 03/11/21 2353

## 2021-03-11 NOTE — H&P (Signed)
[place]   PATIENT NAME: Roger Byrd    MR#:  [id number]  DATE OF BIRTH:  December 30, 1940  DATE OF ADMISSION:  03/11/2021  PRIMARY CARE PHYSICIAN: Venita Lick, NP   Patient is coming from: Home  REQUESTING/REFERRING PHYSICIAN: Delman Kitten, MD  CHIEF COMPLAINT:   Chief Complaint  Patient presents with   Chest Pain    HISTORY OF PRESENT ILLNESS:  Roger Byrd is a 80 y.o. male with medical history significant for coronary artery disease status post PCI and stent on 12/26/2020, hypertension, dyslipidemia, hypothyroidism, anxiety and chronic kidney disease, presented to the ER with acute onset of palpitations that started this afternoon with associated chest tightness that she graded 3/10 in severity with no radiation.  He denies any leg pain or edema or recent travels or surgeries.  No cough or wheezing or hemoptysis.  No fever or chills.  No bleeding diathesis.  No nausea or vomiting or abdominal pain or diaphoresis.  ED Course: Upon presenting to the emergency room heart rate was 128 and blood pressure 99/71 and later 125/96 then 119/57 with respiratory rate later on 23 and pulse oximetry has been normal.  Labs revealed a BUN of 27 and creatinine of 1.45 slightly up from previous levels of 22/1.27 on 02/06/2021.  LFTs showed total bili of 6.3 and was otherwise normal.  High-sensitivity troponin was 19 and later 33.  CBC was within normal.  Coagulation profile was normal.  High-sensitivity troponin I was EKG as reviewed by me : Showed significant tachycardia with a rate of 137 with left anterior fascicular block and minimal voltage criteria for LVH.  Repeat EKG after IV Cardizem 10 mg showed normal sinus rhythm with a rate of 63 with PACs, prolonged PR interval and nonspecific interventricular conduction delay and LAD. Imaging: The view chest x-ray showed aortic atherosclerosis with no acute cardiopulmonary disease.Patient  Patient was given 500 mL IV normal saline bolus in  addition to 10 mg of IV Cardizem as mentioned above.  Will be admitted to an observation progressive unit bed for further evaluation and management. PAST MEDICAL HISTORY:   Past Medical History:  Diagnosis Date   Anxiety    Chronic kidney disease    Heart attack ([place])    Hyperlipidemia    Hypertension    Insomnia    Thyroid disease     PAST SURGICAL HISTORY:   Past Surgical History:  Procedure Laterality Date   BACK SURGERY     x2   CARPAL TUNNEL RELEASE Right 03/08/2018   Procedure: CARPAL TUNNEL RELEASE;  Surgeon: Earnestine Leys, MD;  Location: [facility] ORS;  Service: Orthopedics;  Laterality: Right;   COLONOSCOPY WITH PROPOFOL N/A 07/19/2015   Procedure: COLONOSCOPY WITH PROPOFOL;  Surgeon: Manya Silvas, MD;  Location: [facility] ENDOSCOPY;  Service: Endoscopy;  Laterality: N/A;   CORONARY STENT PLACEMENT      SOCIAL HISTORY:   Social History   Tobacco Use   Smoking status: Former    Types: Cigarettes    Quit date: 08/08/1970    Years since quitting: 50.6   Smokeless tobacco: Never  Substance Use Topics   Alcohol use: Yes    Alcohol/week: 14.0 standard drinks    Types: 14 Glasses of wine per week    Comment: 6 oz glasses/ red wine     FAMILY HISTORY:   Family History  Problem Relation Age of Onset   Hypertension Mother    Heart disease Mother  heart attack   Hypertension Sister    Emphysema Father    Hypertension Daughter    Hypertension Son    Hypertension Sister    Hypertension Sister    Emphysema Sister     DRUG ALLERGIES:  No Known Allergies  REVIEW OF SYSTEMS:   ROS As per history of present illness. All pertinent systems were reviewed above. Constitutional, HEENT, cardiovascular, respiratory, GI, GU, musculoskeletal, neuro, psychiatric, endocrine, integumentary and hematologic systems were reviewed and are otherwise negative/unremarkable except for positive findings mentioned above in the HPI.   MEDICATIONS AT HOME:   Prior to Admission  medications   Medication Sig Start Date End Date Taking? Authorizing Provider  aspirin 81 MG tablet Take 81 mg by mouth every evening.    Yes [provider]  atorvastatin (LIPITOR) 80 MG tablet Take 1 tablet by mouth daily at 12 noon. 05/01/15  Yes [provider]  carvedilol (COREG) 3.125 MG tablet Take 1 tablet by mouth 2 (two) times daily. 01/08/21 01/08/22 Yes [provider]  levothyroxine (SYNTHROID) 25 MCG tablet Take 1 tablet (25 mcg total) by mouth daily. 02/15/21  Yes Cannady, Jolene T, NP  metoprolol tartrate (LOPRESSOR) 25 MG tablet Take 0.5 tablets (12.5 mg total) by mouth 2 (two) times daily. 03/11/21  Yes Fisher, Linden Dolin, PA-C  ticagrelor (BRILINTA) 90 MG TABS tablet Take 1 tablet by mouth 2 (two) times daily. 12/27/20  Yes [provider]  valsartan (DIOVAN) 80 MG tablet Take 40 mg by mouth at bedtime.   Yes [provider]      VITAL SIGNS:  Blood pressure (!) 144/66, pulse (!) 53, temperature (!) 97.5 F (36.4 C), temperature source Oral, resp. rate 18, height '5\' 8"'$  (1.727 m), weight 66.2 kg, SpO2 99 %.  PHYSICAL EXAMINATION:  Physical Exam  GENERAL:  80 y.o.-year-old Caucasian male patient lying in the bed with no acute distress.  EYES: Pupils equal, round, reactive to light and accommodation. No scleral icterus. Extraocular muscles intact.  HEENT: Head atraumatic, normocephalic. Oropharynx and nasopharynx clear.  NECK:  Supple, no jugular venous distention. No thyroid enlargement, no tenderness.  LUNGS: Normal breath sounds bilaterally, no wheezing, rales,rhonchi or crepitation. No use of accessory muscles of respiration.  CARDIOVASCULAR: Regular rate and rhythm, S1, S2 normal. No murmurs, rubs, or gallops.  ABDOMEN: Soft, nondistended, nontender. Bowel sounds present. No organomegaly or mass.  EXTREMITIES: No pedal edema, cyanosis, or clubbing.  NEUROLOGIC: Cranial nerves II through XII are intact. Muscle strength 5/5 in all  extremities. Sensation intact. Gait not checked.  PSYCHIATRIC: The patient is alert and oriented x 3.  Normal affect and good eye contact. SKIN: No obvious rash, lesion, or ulcer.   LABORATORY PANEL:   CBC Recent Labs  Lab 03/11/21 1702  WBC 7.7  HGB 14.2  HCT 43.1  PLT 335   ------------------------------------------------------------------------------------------------------------------  Chemistries  Recent Labs  Lab 03/11/21 1702 03/11/21 1923  NA 140  --   K 3.8  --   CL 106  --   CO2 24  --   GLUCOSE 136*  --   BUN 27*  --   CREATININE 1.45*  --   CALCIUM 9.2  --   AST  --  24  ALT  --  32  ALKPHOS  --  48  BILITOT  --  0.4   ------------------------------------------------------------------------------------------------------------------  Cardiac Enzymes No results for input(s): TROPONINI in the last 168 hours. ------------------------------------------------------------------------------------------------------------------  RADIOLOGY:  DG Chest 2 View  Result Date:  03/11/2021 CLINICAL DATA:  chest tightness that started today with shob. Denies n/v. Hx of stent in June. Tachy in triage EXAM: CHEST - 2 VIEW COMPARISON:  Chest x-ray 09/15/2017. FINDINGS: The heart and mediastinal contours are unchanged. Aortic calcification. Biapical pleural/pulmonary scarring. No focal consolidation. No pulmonary edema. No pleural effusion. No pneumothorax. No acute osseous abnormality. IMPRESSION: 1. No active cardiopulmonary disease. 2.  Aortic Atherosclerosis (ICD10-I70.0). Electronically Signed   By: Iven Finn M.D.   On: 03/11/2021 19:08      IMPRESSION AND PLAN:  Active Problems:   Chest pain  1.  Chest pain likely due to supraventricular tachycardia, rule out acute coronary syndrome, with history of coronary artery disease status post PCI and stent. - The patient be admitted to a progressive unit observation bed. - We will follow serial troponin I's. - He will  be hydrated with IV normal saline. - We will place him on as needed sublingual nitroglycerin for throat pain. - We will continue his aspirin and Brilinta. - We will continue statin and ARB as well as beta-blocker therapy. - Cardiology consult to be obtained. - I notified Dr. Nehemiah Massed about the patient.  2.  Essential hypertension. - We will continue Lopressor, Diovan and Coreg.  3.  Hypothyroidism. - We will continue Synthroid.  4.  Dyslipidemia. - We will continue statin therapy.  DVT prophylaxis: Lovenox.  Code Status: full code.  Family Communication:  The plan of care was discussed in details with the patient (and family). I answered all questions. The patient agreed to proceed with the above mentioned plan. Further management will depend upon hospital course. Disposition Plan: Back to previous home environment Consults called: Cardiology consult All the records are reviewed and case discussed with ED provider.  Status is: Observation  The patient remains OBS appropriate and will d/c before 2 midnights.  Dispo: The patient is from: Home              Anticipated d/c is to: Home              Patient currently is not medically stable to d/c.   Difficult to place patient No  TOTAL TIME TAKING CARE OF THIS PATIENT: 55 minutes.    Christel Mormon M.D on 03/11/2021 at 9:54 PM  [facility]   From 7 PM-7 AM, contact night-coverage [email or web address]  CC: Primary care physician; Venita Lick, NP

## 2021-03-12 ENCOUNTER — Encounter: Payer: Self-pay | Admitting: Family Medicine

## 2021-03-12 DIAGNOSIS — R079 Chest pain, unspecified: Secondary | ICD-10-CM | POA: Diagnosis not present

## 2021-03-12 DIAGNOSIS — I480 Paroxysmal atrial fibrillation: Secondary | ICD-10-CM | POA: Diagnosis not present

## 2021-03-12 LAB — RESP PANEL BY RT-PCR (FLU A&B, COVID) ARPGX2
Influenza A by PCR: NEGATIVE
Influenza B by PCR: NEGATIVE
SARS Coronavirus 2 by RT PCR: NEGATIVE

## 2021-03-12 LAB — BASIC METABOLIC PANEL
Anion gap: 5 (ref 5–15)
BUN: 23 mg/dL (ref 8–23)
CO2: 24 mmol/L (ref 22–32)
Calcium: 8.5 mg/dL — ABNORMAL LOW (ref 8.9–10.3)
Chloride: 111 mmol/L (ref 98–111)
Creatinine, Ser: 1.21 mg/dL (ref 0.61–1.24)
GFR, Estimated: >60 mL/min (ref >60)
Glucose, Bld: 113 mg/dL — ABNORMAL HIGH (ref 70–99)
Potassium: 3.8 mmol/L (ref 3.5–5.1)
Sodium: 140 mmol/L (ref 135–145)

## 2021-03-12 LAB — CBC
HCT: 37.4 % — ABNORMAL LOW (ref 39.0–52.0)
Hemoglobin: 12.6 g/dL — ABNORMAL LOW (ref 13.0–17.0)
MCH: 27.4 pg (ref 26.0–34.0)
MCHC: 33.7 g/dL (ref 30.0–36.0)
MCV: 81.3 fL (ref 80.0–100.0)
Platelets: 264 10*3/uL (ref 150–400)
RBC: 4.6 MIL/uL (ref 4.22–5.81)
RDW: 14 % (ref 11.5–15.5)
WBC: 7.7 10*3/uL (ref 4.0–10.5)
nRBC: 0 % (ref 0.0–0.2)

## 2021-03-12 MED ORDER — METOPROLOL TARTRATE 25 MG PO TABS
12.5000 mg | ORAL_TABLET | Freq: Two times a day (BID) | ORAL | Status: DC
  Administered 2021-03-12: 12.5 mg via ORAL
  Filled 2021-03-12: qty 1

## 2021-03-12 NOTE — Plan of Care (Signed)
  Problem: Education: Goal: Knowledge of General Education information will improve Description Including pain rating scale, medication(s)/side effects and non-pharmacologic comfort measures Outcome: Progressing   Problem: Health Behavior/Discharge Planning: Goal: Ability to manage health-related needs will improve Outcome: Progressing   

## 2021-03-12 NOTE — Progress Notes (Signed)
Discharge instructions, RX's and follow up appts explained and provided to patient and c/g verbalized understanding. Patient left floor via wheelchair accompanied by volunteers, no c/o pain or shortness of breath at d/c.   Ammiel Guiney, Tivis Ringer, RN

## 2021-03-12 NOTE — Plan of Care (Signed)
  Problem: Education: Goal: Knowledge of General Education information will improve Description: Including pain rating scale, medication(s)/side effects and non-pharmacologic comfort measures 03/12/2021 1425 by Emmaline Life, RN Outcome: Adequate for Discharge 03/12/2021 1143 by Emmaline Life, RN Outcome: Progressing   Problem: Health Behavior/Discharge Planning: Goal: Ability to manage health-related needs will improve 03/12/2021 1425 by Emmaline Life, RN Outcome: Adequate for Discharge 03/12/2021 1143 by Emmaline Life, RN Outcome: Progressing   Problem: Clinical Measurements: Goal: Ability to maintain clinical measurements within normal limits will improve Outcome: Adequate for Discharge Goal: Will remain free from infection Outcome: Adequate for Discharge Goal: Diagnostic test results will improve Outcome: Adequate for Discharge Goal: Respiratory complications will improve Outcome: Adequate for Discharge Goal: Cardiovascular complication will be avoided Outcome: Adequate for Discharge   Problem: Activity: Goal: Risk for activity intolerance will decrease Outcome: Adequate for Discharge   Problem: Nutrition: Goal: Adequate nutrition will be maintained Outcome: Adequate for Discharge   Problem: Coping: Goal: Level of anxiety will decrease Outcome: Adequate for Discharge   Problem: Elimination: Goal: Will not experience complications related to bowel motility Outcome: Adequate for Discharge Goal: Will not experience complications related to urinary retention Outcome: Adequate for Discharge   Problem: Pain Managment: Goal: General experience of comfort will improve Outcome: Adequate for Discharge   Problem: Safety: Goal: Ability to remain free from injury will improve Outcome: Adequate for Discharge   Problem: Skin Integrity: Goal: Risk for impaired skin integrity will decrease Outcome: Adequate for Discharge   Problem: Education: Goal:  Understanding of CV disease, CV risk reduction, and recovery process will improve Outcome: Adequate for Discharge Goal: Individualized Educational Video(s) Outcome: Adequate for Discharge   Problem: Activity: Goal: Ability to return to baseline activity level will improve Outcome: Adequate for Discharge   Problem: Cardiovascular: Goal: Ability to achieve and maintain adequate cardiovascular perfusion will improve Outcome: Adequate for Discharge Goal: Vascular access site(s) Level 0-1 will be maintained Outcome: Adequate for Discharge   Problem: Health Behavior/Discharge Planning: Goal: Ability to safely manage health-related needs after discharge will improve Outcome: Adequate for Discharge

## 2021-03-12 NOTE — Consult Note (Addendum)
[facility] Cardiology Consultation Note  Patient ID: Roger Byrd, MRN: [id number], DOB/AGE: [birth year] 80 y.o. Admit date: 03/11/2021   Date of Consult: 03/12/2021 Primary Physician: Venita Lick, NP Primary Cardiologist: Dr. Nehemiah Massed  Chief Complaint:  Chief Complaint  Patient presents with   Chest Pain   Reason for Consult:  Chest pain, SVT  HPI: 80 y.o. male with a past medical history of coronary artery disease s/p PCI and stent on 12/26/2020, hypertension, hyperlipidemia, hypothyroidism, CKD.  Patient presented to the ER with complaints of palpitations chest tightness yesterday afternoon.  Patient was found to have a heart rate of 128 and a blood pressure of 99/71.  Troponins initially 19 and later 33.  EKG in the ED showed tachycardia at a rate of 137.  Patient was given IV Cardizem 10 mg and converted to normal sinus rhythm at a rate of 63 with PACs, prolonged PR interval.  Chest x-ray showed no acute cardiopulmonary disease.  Patient was admitted for observation and further evaluation and management.  Today patient states that he has been doing well and has had no further episodes of palpitations or SVT since admission.  Patient denies any chest pain, shortness of breath, lower extremity swelling, fatigue.  He is clinically stable and has remained in normal sinus rhythm since administration of Cardizem last night.  Past Medical History:  Diagnosis Date   Anxiety    Chronic kidney disease    Coronary artery disease    Dysrhythmia    Heart attack ([place])    Hyperlipidemia    Hypertension    Hypothyroidism    Insomnia    Thyroid disease       Surgical History:  Past Surgical History:  Procedure Laterality Date   BACK SURGERY     x2   CARDIAC CATHETERIZATION     CARPAL TUNNEL RELEASE Right 03/08/2018   Procedure: CARPAL TUNNEL RELEASE;  Surgeon: Earnestine Leys, MD;  Location: [facility] ORS;  Service: Orthopedics;  Laterality: Right;   COLONOSCOPY WITH PROPOFOL N/A  07/19/2015   Procedure: COLONOSCOPY WITH PROPOFOL;  Surgeon: Manya Silvas, MD;  Location: [facility] ENDOSCOPY;  Service: Endoscopy;  Laterality: N/A;   CORONARY ANGIOPLASTY     CORONARY STENT PLACEMENT       Home Meds: Prior to Admission medications   Medication Sig Start Date End Date Taking? Authorizing Provider  aspirin 81 MG tablet Take 81 mg by mouth every evening.    Yes [provider]  atorvastatin (LIPITOR) 80 MG tablet Take 1 tablet by mouth daily at 12 noon. 05/01/15  Yes [provider]  carvedilol (COREG) 3.125 MG tablet Take 1 tablet by mouth 2 (two) times daily. 01/08/21 01/08/22 Yes [provider]  levothyroxine (SYNTHROID) 25 MCG tablet Take 1 tablet (25 mcg total) by mouth daily. 02/15/21  Yes Cannady, Jolene T, NP  metoprolol tartrate (LOPRESSOR) 25 MG tablet Take 0.5 tablets (12.5 mg total) by mouth 2 (two) times daily. 03/11/21  Yes Fisher, Linden Dolin, PA-C  ticagrelor (BRILINTA) 90 MG TABS tablet Take 1 tablet by mouth 2 (two) times daily. 12/27/20  Yes [provider]  valsartan (DIOVAN) 80 MG tablet Take 40 mg by mouth at bedtime.   Yes [provider]    Inpatient Medications:   aspirin EC  81 mg Oral QPM   atorvastatin  80 mg Oral Q1200   enoxaparin (LOVENOX) injection  40 mg Subcutaneous QHS   irbesartan  37.5 mg Oral Daily   levothyroxine  25 mcg Oral Daily   metoprolol tartrate  12.5 mg Oral BID   ticagrelor  90 mg Oral BID    sodium chloride 100 mL/hr at 03/12/21 0044    Allergies: No Known Allergies  Social History   Socioeconomic History   Marital status: Widowed    Spouse name: Not on file   Number of children: Not on file   Years of education: 12   Highest education level: 12th grade  Occupational History   Occupation: retired   Tobacco Use   Smoking status: Former    Types: Cigarettes    Quit date: 08/08/1970    Years since quitting: 50.6   Smokeless tobacco: Never  Vaping Use   Vaping Use: Never  used  Substance and Sexual Activity   Alcohol use: Yes    Alcohol/week: 14.0 standard drinks    Types: 14 Glasses of wine per week    Comment: 6 oz glasses/ red wine    Drug use: No   Sexual activity: Not Currently  Other Topics Concern   Not on file  Social History Narrative   Not on file   Social Determinants of Health   Financial Resource Strain: Low Risk    Difficulty of Paying Living Expenses: Not hard at all  Food Insecurity: No Food Insecurity   Worried About Charity fundraiser in the Last Year: Never true   Ran Out of Food in the Last Year: Never true  Transportation Needs: No Transportation Needs   Lack of Transportation (Medical): No   Lack of Transportation (Non-Medical): No  Physical Activity: Sufficiently Active   Days of Exercise per Week: 3 days   Minutes of Exercise per Session: 150+ min  Stress: No Stress Concern Present   Feeling of Stress : Not at all  Social Connections: Not on file  Intimate Partner Violence: Not At Risk   Fear of Current or Ex-Partner: No   Emotionally Abused: No   Physically Abused: No   Sexually Abused: No     Family History  Problem Relation Age of Onset   Hypertension Mother    Heart disease Mother        heart attack   Hypertension Sister    Emphysema Father    Hypertension Daughter    Hypertension Son    Hypertension Sister    Hypertension Sister    Emphysema Sister      Review of Systems Positive for none Negative for: General:  chills, fever, night sweats or weight changes.  Cardiovascular: PND orthopnea syncope dizziness  Dermatological skin lesions rashes Respiratory: Cough congestion Urologic: Frequent urination urination at night and hematuria Abdominal: negative for nausea, vomiting, diarrhea, bright red blood per rectum, melena, or hematemesis Neurologic: negative for visual changes, and/or hearing changes  All other systems reviewed and are otherwise negative except as noted above.  Labs: No results  for input(s): CKTOTAL, CKMB, TROPONINI in the last 72 hours. Lab Results  Component Value Date   WBC 7.7 03/12/2021   HGB 12.6 (L) 03/12/2021   HCT 37.4 (L) 03/12/2021   MCV 81.3 03/12/2021   PLT 264 03/12/2021    Recent Labs  Lab 03/11/21 1923 03/12/21 0447  NA  --  140  K  --  3.8  CL  --  111  CO2  --  24  BUN  --  23  CREATININE  --  1.21  CALCIUM  --  8.5*  PROT 6.3*  --   BILITOT 0.4  --  ALKPHOS 48  --   ALT 32  --   AST 24  --   GLUCOSE  --  113*   Lab Results  Component Value Date   CHOL 135 02/14/2021   HDL 44 02/14/2021   LDLCALC 77 02/14/2021   TRIG 67 02/14/2021   No results found for: DDIMER  Radiology/Studies:  DG Chest 2 View  Result Date: 03/11/2021 CLINICAL DATA:  chest tightness that started today with shob. Denies n/v. Hx of stent in June. Tachy in triage EXAM: CHEST - 2 VIEW COMPARISON:  Chest x-ray 09/15/2017. FINDINGS: The heart and mediastinal contours are unchanged. Aortic calcification. Biapical pleural/pulmonary scarring. No focal consolidation. No pulmonary edema. No pleural effusion. No pneumothorax. No acute osseous abnormality. IMPRESSION: 1. No active cardiopulmonary disease. 2.  Aortic Atherosclerosis (ICD10-I70.0). Electronically Signed   By: Iven Finn M.D.   On: 03/11/2021 19:08    EKG: ED EKG showed SVT 837 bpm with left anterior fascicular block Repeat EKG after administration of Cardizem showed sinus rhythm at 62 bpm with PACs   Weights: Filed Weights   03/11/21 1702 03/12/21 0217  Weight: 66.2 kg 66.1 kg     Physical Exam: Blood pressure 137/70, pulse (!) 48, temperature 97.6 F (36.4 C), temperature source Oral, resp. rate 18, height '5\' 8"'$  (1.727 m), weight 66.1 kg, SpO2 99 %. Body mass index is 22.16 kg/m. General: Well developed, well nourished, in no acute distress. Head eyes ears nose throat: Normocephalic, atraumatic, sclera non-icteric, no xanthomas, nares are without discharge. No apparent thyromegaly  and/or mass  Lungs: Normal respiratory effort.  no wheezes, no rales, no rhonchi.  Heart: RRR with normal S1 S2. no murmur gallop, no rub, PMI is normal size and placement, carotid upstroke normal without bruit, jugular venous pressure is normal Abdomen: Soft, non-tender, non-distended with normoactive bowel sounds. No hepatomegaly. No rebound/guarding. No obvious abdominal masses. Abdominal aorta is normal size without bruit Extremities: No edema. no cyanosis, no clubbing, no ulcers  Peripheral : 2+ bilateral upper extremity pulses, 2+ bilateral femoral pulses, 2+ bilateral dorsal pedal pulse Neuro: Alert and oriented. No facial asymmetry. No focal deficit. Moves all extremities spontaneously. Musculoskeletal: Normal muscle tone without kyphosis Psych:  Responds to questions appropriately with a normal affect.    Assessment: Supraventricular tachycardia currently remaining in normal sinus rhythm after Cardizem administration Coronary artery disease status post PCI and stent 11/2020 Hypertension Hyperlipidemia  Plan: -Discontinue carvedilol and replace with metoprolol 12.5 mg twice daily for heart rate control with episodic SVT -Continue dual antiplatelet therapy with Brilinta and aspirin -Continue irbesartan and statin for blood pressure control and management of hyperlipidemia -Patient is currently stable from a cardiovascular standpoint and ready for discharge.  Patient should follow-up as an outpatient in approximately 1 week.  Signed, Jettie Booze PA-C [facility] Card[facility] 03/12/2021, 9:21 AM The patient has been interviewed and examined. I agree with assessment and plan above. Serafina Royals MD [facility]

## 2021-03-12 NOTE — ED Notes (Signed)
Pt transported to 239 via wheelchair by RN, stable at transfer

## 2021-03-12 NOTE — Discharge Summary (Signed)
Physician Discharge Summary  Roger Byrd [id number] DOB: 05/02/1941 DOA: 03/11/2021  PCP: Venita Lick, NP  Admit date: 03/11/2021 Discharge date: 03/12/2021  Admitted From: Home Disposition:  Home  Discharge Condition:Stable CODE STATUS:FULL Diet recommendation: Heart Healthy  Brief/Interim Summary:   Roger Byrd is a 80 y.o. male with medical history significant for coronary artery disease status post PCI and stent on 12/26/2020, hypertension, dyslipidemia, hypothyroidism, anxiety and chronic kidney disease, presented to the ER with acute onset of palpitations  with associated chest tightness.Upon presenting to the emergency room heart rate was 128 and blood pressure 99/71 and later 125/96 then 119/57 with respiratory rate later on 23 and pulse oximetry has been normal.  Labs revealed a BUN of 27 and creatinine of 1.45 slightly up from previous levels of 22/1.27 on 02/06/2021.  LFTs showed total bili of 6.3 and was otherwise normal.  High-sensitivity troponin was 19 and later 33.  CBC was within normal.  Coagulation profile was normal.  High-sensitivity troponin were mildly elevated but flat trend. EKG Showed significant tachycardia with a rate of 137 with left anterior fascicular block and minimal voltage criteria for LVH.  Repeat EKG after IV Cardizem 10 mg showed normal sinus rhythm with a rate of 63 with PACs, prolonged PR interval and nonspecific interventricular conduction delay and LAD. Chest x-ray showed aortic atherosclerosis with no acute cardiopulmonary disease Patient was given 500 mL IV normal saline bolus in addition to 10 mg of IV Cardizem as mentioned above.  He was admitted for observation  for further evaluation and management.  Hospital course:  His hospital course remained stable.  He was tachycardic on presentation, currently in normal sinus rhythm this morning.  He was given IV Cardizem 1 dose 10 mg. Cardiology was also consulted and following.  Carvedilol  has been changed to metoprolol.  Patient seen and examined the bedside this morning.  Currently he is hemodynamically stable.  Comfortable.  Denies any chest pain or shortness of breath.  He is medically stable for discharge to home today.  He will continue aspirin and Brilinta and his blood pressure medications that he was taking at home.  He will follow-up with cardiology as an outpatient.  No further work-up planned during this admission.  Discharge Diagnoses:  Active Problems:   Chest pain    Discharge Instructions  Discharge Instructions     Diet - low sodium heart healthy   Complete by: As directed    Discharge instructions   Complete by: As directed    1)Please take prescribed medications as instructed 2)Follow up with your cardiologist as an outpatient   Increase activity slowly   Complete by: As directed       Allergies as of 03/12/2021   No Known Allergies      Medication List     STOP taking these medications    carvedilol 3.125 MG tablet Commonly known as: COREG       TAKE these medications    aspirin 81 MG tablet Take 81 mg by mouth every evening.   atorvastatin 80 MG tablet Commonly known as: LIPITOR Take 1 tablet by mouth daily at 12 noon.   levothyroxine 25 MCG tablet Commonly known as: SYNTHROID Take 1 tablet (25 mcg total) by mouth daily.   metoprolol tartrate 25 MG tablet Commonly known as: LOPRESSOR Take 0.5 tablets (12.5 mg total) by mouth 2 (two) times daily.   ticagrelor 90 MG Tabs tablet Commonly known as: BRILINTA Take 1 tablet by  mouth 2 (two) times daily.   valsartan 80 MG tablet Commonly known as: DIOVAN Take 40 mg by mouth at bedtime.        Follow-up Information     Corey Skains, MD. Schedule an appointment as soon as possible for a visit .   Specialty: Cardiology Why: call in the morning to be seen in 1-2 days Contact information: [address] [facility] [place]  [address] [phone]                No Known Allergies  Consultations: Cardiology   Procedures/Studies: DG Chest 2 View  Result Date: 03/11/2021 CLINICAL DATA:  chest tightness that started today with shob. Denies n/v. Hx of stent in June. Tachy in triage EXAM: CHEST - 2 VIEW COMPARISON:  Chest x-ray 09/15/2017. FINDINGS: The heart and mediastinal contours are unchanged. Aortic calcification. Biapical pleural/pulmonary scarring. No focal consolidation. No pulmonary edema. No pleural effusion. No pneumothorax. No acute osseous abnormality. IMPRESSION: 1. No active cardiopulmonary disease. 2.  Aortic Atherosclerosis (ICD10-I70.0). Electronically Signed   By: Iven Finn M.D.   On: 03/11/2021 19:08      Subjective: Patient seen and examined at the bedside this morning.  Hemodynamically stable for discharge today.  Discharge Exam: Vitals:   03/12/21 0811 03/12/21 1153  BP: 137/70 137/71  Pulse: (!) 48 (!) 52  Resp: 18 18  Temp: 97.6 F (36.4 C) (!) 97.5 F (36.4 C)  SpO2: 99% 99%   Vitals:   03/12/21 0217 03/12/21 0337 03/12/21 0811 03/12/21 1153  BP:  113/61 137/70 137/71  Pulse:  (!) 52 (!) 48 (!) 52  Resp:  '17 18 18  '$ Temp:  98.7 F (37.1 C) 97.6 F (36.4 C) (!) 97.5 F (36.4 C)  TempSrc:   Oral Oral  SpO2:  99% 99% 99%  Weight: 66.1 kg     Height: '5\' 8"'$  (1.727 m)       General: Pt is alert, awake, not in acute distress Cardiovascular: RRR, S1/S2 +, no rubs, no gallops Respiratory: CTA bilaterally, no wheezing, no rhonchi Abdominal: Soft, NT, ND, bowel sounds + Extremities: no edema, no cyanosis    The results of significant diagnostics from this hospitalization (including imaging, microbiology, ancillary and laboratory) are listed below for reference.     Microbiology: Recent Results (from the past 240 hour(s))  Resp Panel by RT-PCR (Flu A&B, Covid) Nasopharyngeal Swab     Status: None   Collection Time: 03/12/21 12:53 AM   Specimen:  Nasopharyngeal Swab; Nasopharyngeal(NP) swabs in vial transport medium  Result Value Ref Range Status   SARS Coronavirus 2 by RT PCR NEGATIVE NEGATIVE Final    Comment: (NOTE) SARS-CoV-2 target nucleic acids are NOT DETECTED.  The SARS-CoV-2 RNA is generally detectable in upper respiratory specimens during the acute phase of infection. The lowest concentration of SARS-CoV-2 viral copies this assay can detect is 138 copies/mL. A negative result does not preclude SARS-Cov-2 infection and should not be used as the sole basis for treatment or other patient management decisions. A negative result may occur with  improper specimen collection/handling, submission of specimen other than nasopharyngeal swab, presence of viral mutation(s) within the areas targeted by this assay, and inadequate number of viral copies(<138 copies/mL). A negative result must be combined with clinical observations, patient history, and epidemiological information. The expected result is Negative.  Fact Sheet for Patients:  EntrepreneurPulse.com.au  Fact Sheet for Healthcare Providers:  IncredibleEmployment.be  This test is no t yet  approved or cleared by the [place] and  has been authorized for detection and/or diagnosis of SARS-CoV-2 by FDA under an Emergency Use Authorization (EUA). This EUA will remain  in effect (meaning this test can be used) for the duration of the COVID-19 declaration under Section 564(b)(1) of the Act, 21 U.S.C.section 360bbb-3(b)(1), unless the authorization is terminated  or revoked sooner.       Influenza A by PCR NEGATIVE NEGATIVE Final   Influenza B by PCR NEGATIVE NEGATIVE Final    Comment: (NOTE) The Xpert Xpress SARS-CoV-2/FLU/RSV plus assay is intended as an aid in the diagnosis of influenza from Nasopharyngeal swab specimens and should not be used as a sole basis for treatment. Nasal washings and aspirates are unacceptable for  Xpert Xpress SARS-CoV-2/FLU/RSV testing.  Fact Sheet for Patients: EntrepreneurPulse.com.au  Fact Sheet for Healthcare Providers: IncredibleEmployment.be  This test is not yet approved or cleared by the [place] FDA and has been authorized for detection and/or diagnosis of SARS-CoV-2 by FDA under an Emergency Use Authorization (EUA). This EUA will remain in effect (meaning this test can be used) for the duration of the COVID-19 declaration under Section 564(b)(1) of the Act, 21 U.S.C. section 360bbb-3(b)(1), unless the authorization is terminated or revoked.  Performed at [facility], [address] [place], [place] [address]      Labs: BNP (last 3 results) No results for input(s): BNP in the last 8760 hours. Basic Metabolic Panel: Recent Labs  Lab 03/11/21 1702 03/12/21 0447  NA 140 140  K 3.8 3.8  CL 106 111  CO2 24 24  GLUCOSE 136* 113*  BUN 27* 23  CREATININE 1.45* 1.21  CALCIUM 9.2 8.5*   Liver Function Tests: Recent Labs  Lab 03/11/21 1923  AST 24  ALT 32  ALKPHOS 48  BILITOT 0.4  PROT 6.3*  ALBUMIN 3.7   No results for input(s): LIPASE, AMYLASE in the last 168 hours. No results for input(s): AMMONIA in the last 168 hours. CBC: Recent Labs  Lab 03/11/21 1702 03/12/21 0447  WBC 7.7 7.7  HGB 14.2 12.6*  HCT 43.1 37.4*  MCV 81.2 81.3  PLT 335 264   Cardiac Enzymes: No results for input(s): CKTOTAL, CKMB, CKMBINDEX, TROPONINI in the last 168 hours. BNP: Invalid input(s): POCBNP CBG: No results for input(s): GLUCAP in the last 168 hours. D-Dimer No results for input(s): DDIMER in the last 72 hours. Hgb A1c No results for input(s): HGBA1C in the last 72 hours. Lipid Profile No results for input(s): CHOL, HDL, LDLCALC, TRIG, CHOLHDL, LDLDIRECT in the last 72 hours. Thyroid function studies No results for input(s): TSH, T4TOTAL, T3FREE, THYROIDAB in the last 72 hours.  Invalid input(s):  FREET3 Anemia work up No results for input(s): VITAMINB12, FOLATE, FERRITIN, TIBC, IRON, RETICCTPCT in the last 72 hours. Urinalysis    Component Value Date/Time   COLORURINE YELLOW (A) 05/10/2018 1540   APPEARANCEUR CLEAR (A) 05/10/2018 1540   APPEARANCEUR Clear 02/23/2018 1418   LABSPEC 1.017 05/10/2018 1540   PHURINE 5.0 05/10/2018 1540   GLUCOSEU 150 (A) 05/10/2018 1540   HGBUR NEGATIVE 05/10/2018 1540   BILIRUBINUR NEGATIVE 05/10/2018 1540   BILIRUBINUR Negative 02/23/2018 1418   KETONESUR NEGATIVE 05/10/2018 1540   PROTEINUR NEGATIVE 05/10/2018 1540   NITRITE NEGATIVE 05/10/2018 1540   LEUKOCYTESUR NEGATIVE 05/10/2018 1540   LEUKOCYTESUR Negative 02/23/2018 1418   Sepsis Labs Invalid input(s): PROCALCITONIN,  WBC,  LACTICIDVEN Microbiology Recent Results (from the past 240 hour(s))  Resp Panel  by RT-PCR (Flu A&B, Covid) Nasopharyngeal Swab     Status: None   Collection Time: 03/12/21 12:53 AM   Specimen: Nasopharyngeal Swab; Nasopharyngeal(NP) swabs in vial transport medium  Result Value Ref Range Status   SARS Coronavirus 2 by RT PCR NEGATIVE NEGATIVE Final    Comment: (NOTE) SARS-CoV-2 target nucleic acids are NOT DETECTED.  The SARS-CoV-2 RNA is generally detectable in upper respiratory specimens during the acute phase of infection. The lowest concentration of SARS-CoV-2 viral copies this assay can detect is 138 copies/mL. A negative result does not preclude SARS-Cov-2 infection and should not be used as the sole basis for treatment or other patient management decisions. A negative result may occur with  improper specimen collection/handling, submission of specimen other than nasopharyngeal swab, presence of viral mutation(s) within the areas targeted by this assay, and inadequate number of viral copies(<138 copies/mL). A negative result must be combined with clinical observations, patient history, and epidemiological information. The expected result is  Negative.  Fact Sheet for Patients:  EntrepreneurPulse.com.au  Fact Sheet for Healthcare Providers:  IncredibleEmployment.be  This test is no t yet approved or cleared by the [place] FDA and  has been authorized for detection and/or diagnosis of SARS-CoV-2 by FDA under an Emergency Use Authorization (EUA). This EUA will remain  in effect (meaning this test can be used) for the duration of the COVID-19 declaration under Section 564(b)(1) of the Act, 21 U.S.C.section 360bbb-3(b)(1), unless the authorization is terminated  or revoked sooner.       Influenza A by PCR NEGATIVE NEGATIVE Final   Influenza B by PCR NEGATIVE NEGATIVE Final    Comment: (NOTE) The Xpert Xpress SARS-CoV-2/FLU/RSV plus assay is intended as an aid in the diagnosis of influenza from Nasopharyngeal swab specimens and should not be used as a sole basis for treatment. Nasal washings and aspirates are unacceptable for Xpert Xpress SARS-CoV-2/FLU/RSV testing.  Fact Sheet for Patients: EntrepreneurPulse.com.au  Fact Sheet for Healthcare Providers: IncredibleEmployment.be  This test is not yet approved or cleared by the [place] FDA and has been authorized for detection and/or diagnosis of SARS-CoV-2 by FDA under an Emergency Use Authorization (EUA). This EUA will remain in effect (meaning this test can be used) for the duration of the COVID-19 declaration under Section 564(b)(1) of the Act, 21 U.S.C. section 360bbb-3(b)(1), unless the authorization is terminated or revoked.  Performed at [facility], [address], [place],  [address]     Please note: You were cared for by a hospitalist during your hospital stay. Once you are discharged, your primary care physician will handle any further medical issues. Please note that NO REFILLS for any discharge medications will be authorized once you are discharged, as  it is imperative that you return to your primary care physician (or establish a relationship with a primary care physician if you do not have one) for your post hospital discharge needs so that they can reassess your need for medications and monitor your lab values.    Time coordinating discharge: 40 minutes  SIGNED:   Shelly Coss, MD  [facility] 03/12/2021, 1:03 PM Pager [phone]  If 7PM-7AM, please contact night-coverage [email or web address] Password [email or web address]

## 2021-03-15 ENCOUNTER — Emergency Department
Admission: EM | Admit: 2021-03-15 | Discharge: 2021-03-15 | Disposition: A | Discharge status: Home / Self Care | Payer: Medicare Other | Attending: Emergency Medicine | Admitting: Emergency Medicine

## 2021-03-15 ENCOUNTER — Other Ambulatory Visit: Payer: Self-pay

## 2021-03-15 ENCOUNTER — Emergency Department: Payer: Medicare Other

## 2021-03-15 DIAGNOSIS — N1831 Chronic kidney disease, stage 3a: Secondary | ICD-10-CM | POA: Diagnosis not present

## 2021-03-15 DIAGNOSIS — R079 Chest pain, unspecified: Secondary | ICD-10-CM | POA: Diagnosis not present

## 2021-03-15 DIAGNOSIS — D631 Anemia in chronic kidney disease: Secondary | ICD-10-CM | POA: Diagnosis not present

## 2021-03-15 DIAGNOSIS — R0789 Other chest pain: Secondary | ICD-10-CM | POA: Insufficient documentation

## 2021-03-15 DIAGNOSIS — Z79899 Other long term (current) drug therapy: Secondary | ICD-10-CM | POA: Insufficient documentation

## 2021-03-15 DIAGNOSIS — E039 Hypothyroidism, unspecified: Secondary | ICD-10-CM | POA: Insufficient documentation

## 2021-03-15 DIAGNOSIS — R000 Tachycardia, unspecified: Secondary | ICD-10-CM | POA: Diagnosis not present

## 2021-03-15 DIAGNOSIS — I2510 Atherosclerotic heart disease of native coronary artery without angina pectoris: Secondary | ICD-10-CM | POA: Insufficient documentation

## 2021-03-15 DIAGNOSIS — I129 Hypertensive chronic kidney disease with stage 1 through stage 4 chronic kidney disease, or unspecified chronic kidney disease: Secondary | ICD-10-CM | POA: Diagnosis not present

## 2021-03-15 DIAGNOSIS — Z7982 Long term (current) use of aspirin: Secondary | ICD-10-CM | POA: Insufficient documentation

## 2021-03-15 DIAGNOSIS — Z87891 Personal history of nicotine dependence: Secondary | ICD-10-CM | POA: Insufficient documentation

## 2021-03-15 LAB — CBC
HCT: 42.2 % (ref 39.0–52.0)
Hemoglobin: 13.9 g/dL (ref 13.0–17.0)
MCH: 26.7 pg (ref 26.0–34.0)
MCHC: 32.9 g/dL (ref 30.0–36.0)
MCV: 81.2 fL (ref 80.0–100.0)
Platelets: 312 10*3/uL (ref 150–400)
RBC: 5.2 MIL/uL (ref 4.22–5.81)
RDW: 14.1 % (ref 11.5–15.5)
WBC: 8.8 10*3/uL (ref 4.0–10.5)
nRBC: 0 % (ref 0.0–0.2)

## 2021-03-15 LAB — BASIC METABOLIC PANEL
Anion gap: 9 (ref 5–15)
BUN: 26 mg/dL — ABNORMAL HIGH (ref 8–23)
CO2: 24 mmol/L (ref 22–32)
Calcium: 9.2 mg/dL (ref 8.9–10.3)
Chloride: 106 mmol/L (ref 98–111)
Creatinine, Ser: 1.33 mg/dL — ABNORMAL HIGH (ref 0.61–1.24)
GFR, Estimated: 54 mL/min — ABNORMAL LOW (ref >60)
Glucose, Bld: 115 mg/dL — ABNORMAL HIGH (ref 70–99)
Potassium: 4.3 mmol/L (ref 3.5–5.1)
Sodium: 139 mmol/L (ref 135–145)

## 2021-03-15 LAB — TROPONIN I (HIGH SENSITIVITY)
Troponin I (High Sensitivity): 37 ng/L — ABNORMAL HIGH (ref <18)
Troponin I (High Sensitivity): 37 ng/L — ABNORMAL HIGH (ref <18)

## 2021-03-15 MED ORDER — METOPROLOL TARTRATE 5 MG/5ML IV SOLN
2.5000 mg | Freq: Once | INTRAVENOUS | Status: AC
  Administered 2021-03-15: 2.5 mg via INTRAVENOUS
  Filled 2021-03-15: qty 5

## 2021-03-15 MED ORDER — METOPROLOL TARTRATE 25 MG PO TABS
12.5000 mg | ORAL_TABLET | Freq: Once | ORAL | Status: AC
  Administered 2021-03-15: 12.5 mg via ORAL
  Filled 2021-03-15: qty 1

## 2021-03-15 NOTE — ED Notes (Signed)
E signature pad not working 

## 2021-03-15 NOTE — Discharge Instructions (Signed)
As we discussed, you have the option of giving yourself a third dose of metoprolol as needed for any further fast heart rates like what happened today.  Continue your typical half pill (12.5 mg) dosing twice daily.  Just use the third dose as needed beyond this.  If after taking your third dose you still have chest pain or fast heart rate, then you need to return to the ED.

## 2021-03-15 NOTE — ED Notes (Signed)
Dr. Smith at bedside.

## 2021-03-15 NOTE — ED Triage Notes (Addendum)
Pt to ER via [facility] with complaints of centralized chest pain non-radiating, describes it as a tightness. Accompanied with shortness of breath. HR 128 in triage.   Pt reports recently spending the night on 9/12 for the same. Has been taking metoprolol as prescribed. Reports since stent placement in June he has been having these issues.

## 2021-03-15 NOTE — ED Provider Notes (Signed)
[facility] Emergency Department Provider Note ____________________________________________   Event Date/Time   First MD Initiated Contact with Patient 03/15/21 1811     (approximate)  I have reviewed the triage vital signs and the nursing notes.  HISTORY  Chief Complaint Chest Pain   HPI RAMCES STETZER is a 80 y.o. malewho presents to the ED for evaluation of chest pain.   Chart review indicates history of CAD s/p PCI and stenting on 6/29, HTN, HLD, hypothyroidism, CKD and recent medical admission less than a week ago due to tachycardia and chest pain.  He had some medication changes were carvedilol was exchanged for metoprolol.  Flat troponins with marginal elevation, likely rate related.  DAPT with aspirin and Brilinta. [facility] cardiology  Patient is to the ED for evaluation of recurrence of this tachycardia with associated chest aching pressure sensation.  He reports his symptoms started out of nowhere around 5 PM this afternoon while he was standing in his house not performing any significant labor.  He reports chest pressure that is mild, may be 2/10 intensity.  Denies shortness of breath, syncope, falls or injuries.  Has not taken his evening dose of metoprolol yet.  No other episodes of this since his discharge a few days ago.  No other complaints. He shows me his BP and heart rate log book that he is been monitoring his vitals since he was discharged earlier this week.  Heart rates 40s-50s, a couple in the low 60s.  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   Hypothyroidism 02/08/2020   Low back pain 02/16/2019   Carpal tunnel syndrome 10/26/2017   IFG (impaired fasting glucose) 09/14/2017   Advanced care planning/counseling discussion 05/27/2016   CKD (chronic kidney disease) 05/01/2015   Hypertension 04/30/2015   Hyperlipidemia 04/30/2015   Anemia due to stage 3a chronic kidney disease ([place]) 04/30/2015    Past Surgical History:  Procedure Laterality Date   BACK SURGERY     x2   CARDIAC CATHETERIZATION     CARPAL TUNNEL RELEASE Right 03/08/2018   Procedure: CARPAL TUNNEL RELEASE;  Surgeon: Earnestine Leys, MD;  Location: [facility] ORS;  Service: Orthopedics;  Laterality: Right;   COLONOSCOPY WITH PROPOFOL N/A 07/19/2015   Procedure: COLONOSCOPY WITH PROPOFOL;  Surgeon: Manya Silvas, MD;  Location: [facility] ENDOSCOPY;  Service: Endoscopy;  Laterality: N/A;   CORONARY ANGIOPLASTY     CORONARY STENT PLACEMENT      Prior to Admission medications   Medication Sig Start Date End Date Taking? Authorizing Provider  aspirin 81 MG tablet Take 81 mg by mouth every evening.    Yes [provider]  atorvastatin (LIPITOR) 80 MG tablet Take 1 tablet by mouth daily at 12 noon. 05/01/15  Yes [provider]  levothyroxine (SYNTHROID) 25 MCG tablet Take 1 tablet (25 mcg total) by mouth daily. 02/15/21  Yes Venita Lick, NP  metoprolol tartrate (LOPRESSOR) 25 MG tablet Take 0.5 tablets (12.5 mg total) by mouth 2 (two) times daily. 03/11/21  Yes Fisher, Linden Dolin, PA-C  ticagrelor (BRILINTA) 90 MG TABS tablet Take 1 tablet by mouth 2 (two) times daily. 12/27/20  Yes [provider]  valsartan (DIOVAN) 80 MG tablet Take 40 mg by mouth at bedtime.   Yes [provider]    Allergies Patient has no known allergies.  Family History  Problem Relation Age of Onset   Hypertension Mother    Heart disease  Mother        heart attack   Hypertension Sister    Emphysema Father    Hypertension Daughter    Hypertension Son    Hypertension Sister    Hypertension Sister    Emphysema Sister     Social History Social History   Tobacco Use   Smoking status: Former    Types: Cigarettes    Quit date: 08/08/1970    Years since quitting: 50.6   Smokeless tobacco: Never  Vaping Use   Vaping Use: Never used  Substance Use Topics   Alcohol use: Yes    Alcohol/week: 14.0 standard drinks    Types: 14 Glasses of wine per week    Comment: 6 oz glasses/ red wine    Drug use: No    Review of Systems  Constitutional: No fever/chills Eyes: No visual changes. ENT: No sore throat. Cardiovascular: Positive chest pain and tachycardia Respiratory: Denies shortness of breath. Gastrointestinal: No abdominal pain.  No nausea, no vomiting.  No diarrhea.  No constipation. Genitourinary: Negative for dysuria. Musculoskeletal: Negative for back pain. Skin: Negative for rash. Neurological: Negative for headaches, focal weakness or numbness.  ____________________________________________   PHYSICAL EXAM:  VITAL SIGNS: Vitals:   03/15/21 1945 03/15/21 2000  BP:  122/70  Pulse: (!) 59 (!) 57  Resp: 19 (!) 23  Temp:    SpO2: 96% 97%     Constitutional: Alert and oriented. Well appearing and in no acute distress. Eyes: Conjunctivae are normal. PERRL. EOMI. Head: Atraumatic. Nose: No congestion/rhinnorhea. Mouth/Throat: Mucous membranes are moist.  Oropharynx non-erythematous. Neck: No stridor. No cervical spine tenderness to palpation. Cardiovascular: Tachycardic rate, regular rhythm. Grossly normal heart sounds.  Good peripheral circulation. Respiratory: Normal respiratory effort.  No retractions. Lungs CTAB. Gastrointestinal: Soft , nondistended, nontender to palpation. No CVA tenderness. Musculoskeletal: No lower extremity tenderness nor edema.  No joint effusions. No signs of acute  trauma. Neurologic:  Normal speech and language. No gross focal neurologic deficits are appreciated. No gait instability noted. Skin:  Skin is warm, dry and intact. No rash noted. Psychiatric: Mood and affect are normal. Speech and behavior are normal.  ____________________________________________   LABS (all labs ordered are listed, but only abnormal results are displayed)  Labs Reviewed  BASIC METABOLIC PANEL - Abnormal; Notable for the following components:      Result Value   Glucose, Bld 115 (*)    BUN 26 (*)    Creatinine, Ser 1.33 (*)    GFR, Estimated 54 (*)    All other components within normal limits  TROPONIN I (HIGH SENSITIVITY) - Abnormal; Notable for the following components:   Troponin I (High Sensitivity) 37 (*)    All other components within normal limits  TROPONIN I (HIGH SENSITIVITY) - Abnormal; Notable for the following components:   Troponin I (High Sensitivity) 37 (*)    All other components within normal limits  CBC   ____________________________________________  12  Lead EKG  Appears to be a junctional tachycardia with a rate of 128 bpm.  Left anterior fascicular block.  Nonspecific ST changes laterally with some J-point depressions.  No STEMI criteria.  Possibly rate related.  Repeat EKG after metoprolol demonstrates a sinus bradycardia with a rate of 58 bpm.  Slightly prolonged PR interval at 266 ms.  No ischemic features ____________________________________________  RADIOLOGY  ED MD interpretation: 2 view CXR reviewed by me without evidence of acute cardiopulmonary pathology  Official radiology report(s): DG Chest 2 View  Result Date: 03/15/2021 CLINICAL DATA:  Chest pain EXAM: CHEST - 2 VIEW COMPARISON:  03/11/2021 FINDINGS: The heart size and mediastinal contours are within normal limits. Both lungs are clear. Mild degenerative changes. IMPRESSION: No active cardiopulmonary disease. Electronically Signed   By: Donavan Foil M.D.   On: 03/15/2021  18:31    ____________________________________________   PROCEDURES and INTERVENTIONS  Procedure(s) performed (including Critical Care):  .1-3 Lead EKG Interpretation Performed by: Vladimir Crofts, MD Authorized by: Vladimir Crofts, MD     Interpretation: abnormal     ECG rate:  130   ECG rate assessment: tachycardic     Rhythm: other rhythm     Ectopy: none     Conduction: normal   .Critical Care Performed by: Vladimir Crofts, MD Authorized by: Vladimir Crofts, MD   Critical care provider statement:    Critical care time (minutes):  30   Critical care was necessary to treat or prevent imminent or life-threatening deterioration of the following conditions:  Circulatory failure and cardiac failure   Critical care was time spent personally by me on the following activities:  Discussions with consultants, evaluation of patient's response to treatment, examination of patient, ordering and performing treatments and interventions, ordering and review of laboratory studies, ordering and review of radiographic studies, pulse oximetry, re-evaluation of patient's condition, obtaining history from patient or surrogate and review of old charts  Medications  metoprolol tartrate (LOPRESSOR) tablet 12.5 mg (12.5 mg Oral Given 03/15/21 1854)  metoprolol tartrate (LOPRESSOR) injection 2.5 mg (2.5 mg Intravenous Given 03/15/21 1855)    ____________________________________________   MDM / ED COURSE   Pleasant 80 year old male presents to the ED with symptomatic tachycardia, requiring IV rate control, and ultimately amenable to outpatient management.  Presents with tachycardic rates around 130 that appears to be a junctional rhythm.  Somewhat soft blood pressures, but no significant instability.  Provided his home dose of metoprolol as well as IV metoprolol with improvement of his rates and return to normal sinus rhythm.  Blood work with normal potassium and flat troponins.  Repeat EKG without ischemic features.   I discussed the case with cardiology who provides medication recommendations, to include an optional third dose of his metoprolol at home for any symptomatic tachycardia.  We discussed following up with cardiology and return precautions for the ED.  Clinical Course as of 03/15/21 2054  Fri Mar 15, 2021  2021 Reassessed.  Patient feeling good.  Sinus rhythm in the 50s.  Requesting discharge.  Waiting for cardiology callback. [DS]    Clinical Course User Index [DS] Vladimir Crofts, MD    ____________________________________________   FINAL CLINICAL IMPRESSION(S) / ED DIAGNOSES  Final diagnoses:  Other chest pain  Tachycardia     ED Discharge Orders     None        Tamlyn Sides Tamala Julian   Note:  This document was prepared using Dragon voice recognition software and may include unintentional dictation errors.  Vladimir Crofts, MD 03/15/21 2055

## 2021-03-19 ENCOUNTER — Other Ambulatory Visit: Payer: Self-pay

## 2021-03-19 ENCOUNTER — Observation Stay
Admission: EM | Admit: 2021-03-19 | Discharge: 2021-03-20 | Disposition: A | Discharge status: Home / Self Care | Payer: Medicare Other | Attending: Internal Medicine | Admitting: Internal Medicine

## 2021-03-19 ENCOUNTER — Emergency Department: Payer: Medicare Other

## 2021-03-19 DIAGNOSIS — R0602 Shortness of breath: Secondary | ICD-10-CM | POA: Diagnosis not present

## 2021-03-19 DIAGNOSIS — Z79899 Other long term (current) drug therapy: Secondary | ICD-10-CM | POA: Insufficient documentation

## 2021-03-19 DIAGNOSIS — E039 Hypothyroidism, unspecified: Secondary | ICD-10-CM | POA: Insufficient documentation

## 2021-03-19 DIAGNOSIS — I479 Paroxysmal tachycardia, unspecified: Secondary | ICD-10-CM | POA: Diagnosis not present

## 2021-03-19 DIAGNOSIS — I4891 Unspecified atrial fibrillation: Secondary | ICD-10-CM | POA: Insufficient documentation

## 2021-03-19 DIAGNOSIS — R000 Tachycardia, unspecified: Secondary | ICD-10-CM | POA: Diagnosis not present

## 2021-03-19 DIAGNOSIS — R0789 Other chest pain: Secondary | ICD-10-CM | POA: Diagnosis present

## 2021-03-19 DIAGNOSIS — I471 Supraventricular tachycardia: Secondary | ICD-10-CM | POA: Diagnosis not present

## 2021-03-19 DIAGNOSIS — I2510 Atherosclerotic heart disease of native coronary artery without angina pectoris: Secondary | ICD-10-CM | POA: Insufficient documentation

## 2021-03-19 DIAGNOSIS — D631 Anemia in chronic kidney disease: Secondary | ICD-10-CM | POA: Diagnosis not present

## 2021-03-19 DIAGNOSIS — Z7982 Long term (current) use of aspirin: Secondary | ICD-10-CM | POA: Insufficient documentation

## 2021-03-19 DIAGNOSIS — Z955 Presence of coronary angioplasty implant and graft: Secondary | ICD-10-CM | POA: Diagnosis not present

## 2021-03-19 DIAGNOSIS — Z20822 Contact with and (suspected) exposure to covid-19: Secondary | ICD-10-CM | POA: Diagnosis not present

## 2021-03-19 DIAGNOSIS — I25110 Atherosclerotic heart disease of native coronary artery with unstable angina pectoris: Secondary | ICD-10-CM | POA: Diagnosis not present

## 2021-03-19 DIAGNOSIS — Z87891 Personal history of nicotine dependence: Secondary | ICD-10-CM | POA: Diagnosis not present

## 2021-03-19 DIAGNOSIS — N1831 Chronic kidney disease, stage 3a: Secondary | ICD-10-CM | POA: Diagnosis not present

## 2021-03-19 DIAGNOSIS — R079 Chest pain, unspecified: Secondary | ICD-10-CM | POA: Diagnosis not present

## 2021-03-19 DIAGNOSIS — I129 Hypertensive chronic kidney disease with stage 1 through stage 4 chronic kidney disease, or unspecified chronic kidney disease: Secondary | ICD-10-CM | POA: Diagnosis not present

## 2021-03-19 LAB — RESP PANEL BY RT-PCR (FLU A&B, COVID) ARPGX2
Influenza A by PCR: NEGATIVE
Influenza B by PCR: NEGATIVE
SARS Coronavirus 2 by RT PCR: NEGATIVE

## 2021-03-19 LAB — CBC
HCT: 47.5 % (ref 39.0–52.0)
Hemoglobin: 15.8 g/dL (ref 13.0–17.0)
MCH: 26.7 pg (ref 26.0–34.0)
MCHC: 33.3 g/dL (ref 30.0–36.0)
MCV: 80.2 fL (ref 80.0–100.0)
Platelets: 390 10*3/uL (ref 150–400)
RBC: 5.92 MIL/uL — ABNORMAL HIGH (ref 4.22–5.81)
RDW: 14 % (ref 11.5–15.5)
WBC: 9.6 10*3/uL (ref 4.0–10.5)
nRBC: 0 % (ref 0.0–0.2)

## 2021-03-19 LAB — BRAIN NATRIURETIC PEPTIDE: B Natriuretic Peptide: 615.1 pg/mL — ABNORMAL HIGH (ref 0.0–100.0)

## 2021-03-19 LAB — BASIC METABOLIC PANEL
Anion gap: 12 (ref 5–15)
BUN: 26 mg/dL — ABNORMAL HIGH (ref 8–23)
CO2: 22 mmol/L (ref 22–32)
Calcium: 9.2 mg/dL (ref 8.9–10.3)
Chloride: 103 mmol/L (ref 98–111)
Creatinine, Ser: 1.54 mg/dL — ABNORMAL HIGH (ref 0.61–1.24)
GFR, Estimated: 46 mL/min — ABNORMAL LOW (ref >60)
Glucose, Bld: 162 mg/dL — ABNORMAL HIGH (ref 70–99)
Potassium: 5.1 mmol/L (ref 3.5–5.1)
Sodium: 137 mmol/L (ref 135–145)

## 2021-03-19 LAB — TROPONIN I (HIGH SENSITIVITY)
Troponin I (High Sensitivity): 29 ng/L — ABNORMAL HIGH (ref <18)
Troponin I (High Sensitivity): 34 ng/L — ABNORMAL HIGH (ref <18)
Troponin I (High Sensitivity): 58 ng/L — ABNORMAL HIGH (ref <18)
Troponin I (High Sensitivity): 102 ng/L (ref <18)
Troponin I (High Sensitivity): 111 ng/L (ref <18)
Troponin I (High Sensitivity): 134 ng/L (ref <18)

## 2021-03-19 LAB — MAGNESIUM: Magnesium: 2.1 mg/dL (ref 1.7–2.4)

## 2021-03-19 MED ORDER — AMIODARONE HCL IN DEXTROSE 360-4.14 MG/200ML-% IV SOLN: 60.0000 mg/h | INTRAVENOUS | Status: DC

## 2021-03-19 MED ORDER — DILTIAZEM HCL 25 MG/5ML IV SOLN: 5.0000 mg | Freq: Once | INTRAVENOUS | Status: DC

## 2021-03-19 MED ORDER — SODIUM CHLORIDE 0.9 % IV BOLUS: 1000.0000 mL | Freq: Once | INTRAVENOUS | Status: DC

## 2021-03-19 MED ORDER — METOPROLOL TARTRATE 5 MG/5ML IV SOLN
2.5000 mg | Freq: Once | INTRAVENOUS | Status: AC
  Administered 2021-03-19: 2.5 mg via INTRAVENOUS
  Filled 2021-03-19: qty 5

## 2021-03-19 MED ORDER — AMIODARONE LOAD VIA INFUSION
150.0000 mg | Freq: Once | INTRAVENOUS | Status: DC
  Filled 2021-03-19: qty 83.34

## 2021-03-19 MED ORDER — ONDANSETRON HCL 4 MG/2ML IJ SOLN: 4.0000 mg | Freq: Four times a day (QID) | INTRAMUSCULAR | Status: DC | PRN

## 2021-03-19 MED ORDER — ACETAMINOPHEN 325 MG PO TABS: 650.0000 mg | ORAL_TABLET | ORAL | Status: DC | PRN

## 2021-03-19 MED ORDER — LEVOTHYROXINE SODIUM 50 MCG PO TABS
25.0000 ug | ORAL_TABLET | Freq: Every day | ORAL | Status: DC
  Administered 2021-03-20: 25 ug via ORAL
  Filled 2021-03-19: qty 1

## 2021-03-19 MED ORDER — ENOXAPARIN SODIUM 40 MG/0.4ML IJ SOSY
40.0000 mg | PREFILLED_SYRINGE | Freq: Every day | INTRAMUSCULAR | Status: DC
  Administered 2021-03-19: 40 mg via SUBCUTANEOUS
  Filled 2021-03-19: qty 0.4

## 2021-03-19 MED ORDER — AMIODARONE HCL IN DEXTROSE 360-4.14 MG/200ML-% IV SOLN
30.0000 mg/h | INTRAVENOUS | Status: DC
  Filled 2021-03-19: qty 200

## 2021-03-19 MED ORDER — CLOPIDOGREL BISULFATE 75 MG PO TABS
300.0000 mg | ORAL_TABLET | Freq: Once | ORAL | Status: AC
  Administered 2021-03-19: 300 mg via ORAL
  Filled 2021-03-19: qty 4

## 2021-03-19 MED ORDER — ATORVASTATIN CALCIUM 20 MG PO TABS
80.0000 mg | ORAL_TABLET | Freq: Every day | ORAL | Status: DC
  Administered 2021-03-19: 80 mg via ORAL
  Filled 2021-03-19 (×2): qty 4

## 2021-03-19 MED ORDER — DILTIAZEM HCL 25 MG/5ML IV SOLN
10.0000 mg | Freq: Once | INTRAVENOUS | Status: AC
  Administered 2021-03-19: 10 mg via INTRAVENOUS
  Filled 2021-03-19: qty 5

## 2021-03-19 MED ORDER — CLOPIDOGREL BISULFATE 75 MG PO TABS
75.0000 mg | ORAL_TABLET | Freq: Every day | ORAL | Status: DC
  Administered 2021-03-19 – 2021-03-20 (×2): 75 mg via ORAL
  Filled 2021-03-19 (×2): qty 1

## 2021-03-19 MED ORDER — ASPIRIN EC 81 MG PO TBEC
81.0000 mg | DELAYED_RELEASE_TABLET | Freq: Every evening | ORAL | Status: DC
  Administered 2021-03-19: 81 mg via ORAL
  Filled 2021-03-19: qty 1

## 2021-03-19 MED ORDER — SODIUM CHLORIDE 0.9 % IV BOLUS
500.0000 mL | Freq: Once | INTRAVENOUS | Status: AC
  Administered 2021-03-19: 500 mL via INTRAVENOUS

## 2021-03-19 NOTE — ED Notes (Signed)
Pt resting quietly.  Loletha Grayer on the monitor at 18.  No chest pain or sob.  Pt alert.

## 2021-03-19 NOTE — ED Notes (Signed)
Dr Bonita Quin via secure chat to clarify amio drip

## 2021-03-19 NOTE — ED Triage Notes (Signed)
First Nurse Note:  Arrives with c/o mid chest pain/ tightness, SOB, and elevated heart rate.  Has been seen through ED several times for same.

## 2021-03-19 NOTE — ED Notes (Signed)
Pt ambulatory to restroom, NAD noted. Steady gait

## 2021-03-19 NOTE — ED Notes (Signed)
Pt c/o sharp centralized cp after cardizem administered, HR drop from 114 to 52. Dr Ellender Hose aware. Repeat EKG performed and signed by Dr Ellender Hose. Pt alert and oriented

## 2021-03-19 NOTE — ED Notes (Signed)
Pt provided coke as requested

## 2021-03-19 NOTE — ED Notes (Signed)
Pt moved to 25.  Pt on cell phone  hr 49 on monitor.  No chest pain or sob.  Pt alert  iv in place.

## 2021-03-19 NOTE — ED Notes (Signed)
Pt ambulated to bathroom with son.  Meds given  labs sent  pt alert  no chest pain or sob.

## 2021-03-19 NOTE — ED Notes (Signed)
Dr Francine Graven notified of troponin 58

## 2021-03-19 NOTE — ED Notes (Signed)
MD Isaacs at bedside  

## 2021-03-19 NOTE — H&P (Signed)
History and Physical    SHAMON COTHRAN OAC:[id number] DOB: 01-14-1941 DOA: 03/19/2021  PCP: Venita Lick, NP   Patient coming from: Home  I have personally briefly reviewed patient's old medical records in [place]  Chief Complaint: Chest tightness  HPI: DWYNE HASEGAWA is a 80 y.o. male with  medical history significant for coronary artery disease status post PCI and stent on 12/26/2020, hypertension, hypothyroidism, anxiety disorder and stage IIIa chronic kidney disease who presents to the ER for evaluation of chest tightness mostly midsternal associated with palpitations and shortness of breath. Patient has been seen in the ER multiple times for the same problem and was recently hospitalized from 09/12 - 09/13 for same. Patient states that he took an extra dose of metoprolol for palpitations this morning. States that every time he gets episodes like this he also gets very dizzy and lightheaded and feels like he is going to fall or pass out but none of that has happened.  His blood pressure was 77/58 upon arrival to the ER. He denies having any nausea, no vomiting, no diaphoresis, no leg swelling, no fever, no chills, no cough, no headache, no urinary symptoms, no changes in his bowel habits, no focal deficits or blurred vision. Labs show sodium 137, potassium 5.1, chloride 103, bicarb 22, glucose 162, BUN 26, creatinine 1.54, calcium 9.2, magnesium 2.1, BNP 615, troponin 29 >> 34, white count 9.6, hemoglobin 15.8, hematocrit 47.5, MCV 80.2, RDW 14.0, platelet count 390 Respiratory viral panel is negative Chest x-ray reviewed by me shows no evidence of active cardiopulmonary disease Twelve-lead EKG reviewed by me shows an accelerated junctional rhythm    ED Course: Patient is a 80 year old Caucasian male who presents to the ER via EMS for evaluation of chest tightness, shortness of breath and palpitations. Patient has had several ER visits for same and states that he took an  extra metoprolol for palpitations and upon arrival to the ER he had a blood pressure of 77/58. Twelve-lead EKG done upon arrival to the ER showed an isolated junctional rhythm and patient received Lopressor 2.5 mg IV x1 as well as 10 mg of IV Cardizem push.  He also received a 500 cc normal saline bolus. At the time of my evaluation patient is back in sinus rhythm and twelve-lead EKG shows sinus bradycardia with first-degree AV block and PVCs. He will be referred to observation status for further evaluation.    Review of Systems: As per HPI otherwise all other systems reviewed and negative.    Past Medical History:  Diagnosis Date   Anxiety    Chronic kidney disease    Coronary artery disease    Dysrhythmia    Heart attack ([place])    Hyperlipidemia    Hypertension    Hypothyroidism    Insomnia    Thyroid disease     Past Surgical History:  Procedure Laterality Date   BACK SURGERY     x2   CARDIAC CATHETERIZATION     CARPAL TUNNEL RELEASE Right 03/08/2018   Procedure: CARPAL TUNNEL RELEASE;  Surgeon: Earnestine Leys, MD;  Location: [facility] ORS;  Service: Orthopedics;  Laterality: Right;   COLONOSCOPY WITH PROPOFOL N/A 07/19/2015   Procedure: COLONOSCOPY WITH PROPOFOL;  Surgeon: Manya Silvas, MD;  Location: [facility] ENDOSCOPY;  Service: Endoscopy;  Laterality: N/A;   CORONARY ANGIOPLASTY     CORONARY STENT PLACEMENT       reports that he quit smoking about 50 years ago. His smoking use  included cigarettes. He has never used smokeless tobacco. He reports current alcohol use of about 14.0 standard drinks per week. He reports that he does not use drugs.  No Known Allergies  Family History  Problem Relation Age of Onset   Hypertension Mother    Heart disease Mother        heart attack   Hypertension Sister    Emphysema Father    Hypertension Daughter    Hypertension Son    Hypertension Sister    Hypertension Sister    Emphysema Sister       Prior to Admission medications    Medication Sig Start Date End Date Taking? Authorizing Provider  aspirin 81 MG tablet Take 81 mg by mouth every evening.    Yes [provider]  atorvastatin (LIPITOR) 80 MG tablet Take 1 tablet by mouth daily at 12 noon. 05/01/15  Yes [provider]  levothyroxine (SYNTHROID) 25 MCG tablet Take 1 tablet (25 mcg total) by mouth daily. 02/15/21  Yes Cannady, Jolene T, NP  metoprolol tartrate (LOPRESSOR) 25 MG tablet Take 0.5 tablets (12.5 mg total) by mouth 2 (two) times daily. 03/11/21  Yes Fisher, Linden Dolin, PA-C  ticagrelor (BRILINTA) 90 MG TABS tablet Take 1 tablet by mouth 2 (two) times daily. 12/27/20  Yes [provider]  valsartan (DIOVAN) 80 MG tablet Take 40 mg by mouth at bedtime.   Yes [provider]    Physical Exam: Vitals:   03/19/21 1151 03/19/21 1209 03/19/21 1245 03/19/21 1322  BP: (!) 112/58 (!) 115/59 122/72   Pulse: (!) 50 (!) 47 (!) 56 (!) 46  Resp: 18 18 18    Temp:      SpO2: 99% 98% 99%      Vitals:   03/19/21 1151 03/19/21 1209 03/19/21 1245 03/19/21 1322  BP: (!) 112/58 (!) 115/59 122/72   Pulse: (!) 50 (!) 47 (!) 56 (!) 46  Resp: 18 18 18    Temp:      SpO2: 99% 98% 99%       Constitutional: Alert and oriented x 3 . Not in any apparent distress HEENT:      Head: Normocephalic and atraumatic.         Eyes: PERLA, EOMI, Conjunctivae are normal. Sclera is non-icteric.       Mouth/Throat: Mucous membranes are moist.       Neck: Supple with no signs of meningismus. Cardiovascular: Bradycardia. No murmurs, gallops, or rubs. 2+ symmetrical distal pulses are present . No JVD. No LE edema Respiratory: Respiratory effort normal .Lungs sounds clear bilaterally. No wheezes, crackles, or rhonchi.  Gastrointestinal: Soft, non tender, and non distended with positive bowel sounds.  Genitourinary: No CVA tenderness. Musculoskeletal: Nontender with normal range of motion in all extremities. No cyanosis, or erythema of  extremities. Neurologic:  Face is symmetric. Moving all extremities. No gross focal neurologic deficits . Skin: Skin is warm, dry.  No rash or ulcers Psychiatric: Mood and affect are normal    Labs on Admission: I have personally reviewed following labs and imaging studies  CBC: Recent Labs  Lab 03/15/21 1750 03/19/21 1014  WBC 8.8 9.6  HGB 13.9 15.8  HCT 42.2 47.5  MCV 81.2 80.2  PLT 312 182   Basic Metabolic Panel: Recent Labs  Lab 03/15/21 1750 03/19/21 1014  NA 139 137  K 4.3 5.1  CL 106 103  CO2 24 22  GLUCOSE 115* 162*  BUN 26* 26*  CREATININE 1.33* 1.54*  CALCIUM  9.2 9.2  MG  --  2.1   GFR: Estimated Creatinine Clearance: 36.4 mL/min (A) (by C-G formula based on SCr of 1.54 mg/dL (H)). Liver Function Tests: No results for input(s): AST, ALT, ALKPHOS, BILITOT, PROT, ALBUMIN in the last 168 hours. No results for input(s): LIPASE, AMYLASE in the last 168 hours. No results for input(s): AMMONIA in the last 168 hours. Coagulation Profile: No results for input(s): INR, PROTIME in the last 168 hours. Cardiac Enzymes: No results for input(s): CKTOTAL, CKMB, CKMBINDEX, TROPONINI in the last 168 hours. BNP (last 3 results) No results for input(s): PROBNP in the last 8760 hours. HbA1C: No results for input(s): HGBA1C in the last 72 hours. CBG: No results for input(s): GLUCAP in the last 168 hours. Lipid Profile: No results for input(s): CHOL, HDL, LDLCALC, TRIG, CHOLHDL, LDLDIRECT in the last 72 hours. Thyroid Function Tests: No results for input(s): TSH, T4TOTAL, FREET4, T3FREE, THYROIDAB in the last 72 hours. Anemia Panel: No results for input(s): VITAMINB12, FOLATE, FERRITIN, TIBC, IRON, RETICCTPCT in the last 72 hours. Urine analysis:    Component Value Date/Time   COLORURINE YELLOW (A) 05/10/2018 1540   APPEARANCEUR CLEAR (A) 05/10/2018 1540   APPEARANCEUR Clear 02/23/2018 1418   LABSPEC 1.017 05/10/2018 1540   PHURINE 5.0 05/10/2018 1540   GLUCOSEU  150 (A) 05/10/2018 1540   HGBUR NEGATIVE 05/10/2018 1540   BILIRUBINUR NEGATIVE 05/10/2018 1540   BILIRUBINUR Negative 02/23/2018 1418   KETONESUR NEGATIVE 05/10/2018 1540   PROTEINUR NEGATIVE 05/10/2018 1540   NITRITE NEGATIVE 05/10/2018 1540   LEUKOCYTESUR NEGATIVE 05/10/2018 1540   LEUKOCYTESUR Negative 02/23/2018 1418    Radiological Exams on Admission: DG Chest 2 View  Result Date: 03/19/2021 CLINICAL DATA:  Chest pain EXAM: CHEST - 2 VIEW COMPARISON:  03/15/2021 FINDINGS: The heart size and mediastinal contours are within normal limits. Atherosclerotic calcification of the aortic knob. No focal airspace consolidation, pleural effusion, or pneumothorax. Degenerative changes of the bilateral shoulders IMPRESSION: No active cardiopulmonary disease. Electronically Signed   By: Davina Poke D.O.   On: 03/19/2021 11:48     Assessment/Plan Principal Problem:   SVT (supraventricular tachycardia) (HCC) Active Problems:   Hypothyroidism   Anemia due to stage 3a chronic kidney disease (HCC)   Hypothyroidism   Coronary artery disease involving native coronary artery of native heart      Patient is a 80 year old male who presents to the ER for evaluation of midsternal chest tightness associated with palpitations and shortness of breath.  He was recently admitted for same on 09/12 - 09/13   SVT/associated junctional rhythm Patient received IV Cardizem and metoprolol and is currently in sinus rhythm/sinus bradycardia Hold metoprolol due to bradycardia We will request cardiology consult     Coronary artery disease status post recent PCI Continue aspirin Per cardiology recommendation we will switch Brilinta to Plavix Continue high intensity statins     Hypertension with chronic kidney disease stage III Hold valsartan and metoprolol due to relative hypotension Renal function is stable     Hypothyroidism Continue Synthroid    DVT prophylaxis: Lovenox  Code Status:  full code  Family Communication: Greater than 50% of time was spent discussing patient's condition and plan of care with him at the bedside.  All questions and concerns have been addressed.  He verbalizes understanding and agrees with the plan. Disposition Plan: Back to previous home environment Consults called: Cardiology Status: Observation    Gabrielly Mccrystal MD [facility]     03/19/2021, 1:34 PM

## 2021-03-19 NOTE — ED Notes (Signed)
See triage note. Pt alert and oriented. Reports chest tightness and high HR today, took extra metoprolol as instructed then BP lowered.

## 2021-03-19 NOTE — ED Notes (Addendum)
Discussed pt with Dr Jerrye Beavers, informed that pt HR currently 40-50. Plavix ordered by Dr Jerrye Beavers. Per Dr Jerrye Beavers, hold amiodarone until further orders placed or further advised.

## 2021-03-19 NOTE — ED Notes (Signed)
Pt in xray

## 2021-03-19 NOTE — ED Notes (Signed)
Pt watching tv.  No acute distress.  Pt waiting on admission bed.

## 2021-03-19 NOTE — ED Notes (Signed)
Resumed care from bri rn.  Pt in hallway bed waiting on admission.  Pt alert.  No chest pain or sob.

## 2021-03-19 NOTE — Progress Notes (Signed)
Brief cardiology consult  Impression Accelerated junctional rhythm Tachycardia paroxysmal Coronary disease Status post recent non-STEMI Status post PCI and stent circumflex DES Chronic renal insufficiency Hypertension Hyperlipidemia Former smoker Chest pain possible angina  Plan Agree with admit rule out for microinfarction follow-up troponins EKGs Continue metoprolol to help with rate control Recommend adding amiodarone loading drip to help with rhythm protection Recommend discontinue Brilinta and switch to Plavix I am concerned that Brilinta may be due to similar side effect of irregular heartbeat and tachycardia Continue aspirin 81 mg a day Recommend maintain adequate hydration Continue Lipitor therapy for hyperlipidemia Maintain valsartan for blood pressure management Consider imdur or nitrates for possible chest pain and anginal symptoms Do not necessarily recommend echocardiogram he had preserved left ventricular function on echo in June at [facility] No invasive procedures planned at this point

## 2021-03-19 NOTE — ED Notes (Signed)
Pt watching tv.  Brady on monitor at 16.

## 2021-03-19 NOTE — ED Triage Notes (Addendum)
Pt comes with c/o SOB, CP and dizziness that started this am. Pt states his BP was running high and he took meds for it.  Current BP-77/58 pt states dizziness at this time.  Pt states this has been occurring every 3 days. Pt states chest tightness. Pt states stents placed back in June.

## 2021-03-19 NOTE — ED Notes (Signed)
Per Dr Ellender Hose, hold amio drip for now   Pt SB on monitor

## 2021-03-19 NOTE — ED Provider Notes (Signed)
[facility] Emergency Department Provider Note  ____________________________________________   Event Date/Time   First MD Initiated Contact with Patient 03/19/21 1022     (approximate)  I have reviewed the triage vital signs and the nursing notes.   HISTORY  Chief Complaint Chest Pain    HPI Roger Byrd is a 80 y.o. 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  The patient states that earlier today, he developed acute onset of palpitations and sensation like he had a dull, aching, chest pressure.  He has a history of A. fib with similar, recurrent episodes.  He reports that he has had some associated general fatigue.  He states he was just walking around the house when this came on, doing nothing particularly exertional or complicated.  Denies any recent med changes.  No over-the-counter med use.  He did have a small cup of coffee this morning, but only drank about half of it.  No recent fevers or chills.  No other complaints.    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   Hypothyroidism 02/08/2020   Low back pain 02/16/2019   Carpal tunnel syndrome 10/26/2017   IFG (impaired fasting glucose) 09/14/2017   Advanced care planning/counseling  discussion 05/27/2016   CKD (chronic kidney disease) 05/01/2015   Hypertension 04/30/2015   Hyperlipidemia 04/30/2015   Hypothyroidism 04/30/2015   Anemia due to stage 3a chronic kidney disease ([place]) 04/30/2015    Past Surgical History:  Procedure Laterality Date   BACK SURGERY     x2   CARDIAC CATHETERIZATION     CARPAL TUNNEL RELEASE Right 03/08/2018   Procedure: CARPAL TUNNEL RELEASE;  Surgeon: Earnestine Leys, MD;  Location: [facility] ORS;  Service: Orthopedics;  Laterality: Right;   COLONOSCOPY WITH PROPOFOL N/A 07/19/2015   Procedure: COLONOSCOPY WITH PROPOFOL;  Surgeon: Manya Silvas, MD;  Location: [facility] ENDOSCOPY;  Service: Endoscopy;  Laterality: N/A;   CORONARY ANGIOPLASTY     CORONARY STENT PLACEMENT      Prior to Admission medications   Medication Sig Start Date End Date Taking? Authorizing Provider  aspirin 81 MG tablet Take 81 mg by mouth every evening.    Yes [provider]  atorvastatin (LIPITOR) 80 MG tablet Take 1 tablet by mouth daily at 12 noon. 05/01/15  Yes [provider]  levothyroxine (SYNTHROID) 25 MCG tablet Take 1 tablet (25 mcg total) by mouth daily. 02/15/21  Yes Cannady, Jolene T, NP  metoprolol tartrate (LOPRESSOR) 25 MG tablet Take 0.5 tablets (12.5 mg total) by mouth 2 (two) times daily. 03/11/21  Yes Fisher, Linden Dolin, PA-C  ticagrelor (BRILINTA) 90 MG TABS tablet Take 1 tablet by mouth 2 (two) times daily. 12/27/20  Yes  [provider]  valsartan (DIOVAN) 80 MG tablet Take 40 mg by mouth at bedtime.   Yes [provider]    Allergies Patient has no known allergies.  Family History  Problem Relation Age of Onset   Hypertension Mother    Heart disease Mother        heart attack   Hypertension Sister    Emphysema Father    Hypertension Daughter    Hypertension Son    Hypertension Sister    Hypertension Sister    Emphysema Sister     Social History Social History   Tobacco Use   Smoking status: Former     Types: Cigarettes    Quit date: 08/08/1970    Years since quitting: 50.6   Smokeless tobacco: Never  Vaping Use   Vaping Use: Never used  Substance Use Topics   Alcohol use: Yes    Alcohol/week: 14.0 standard drinks    Types: 14 Glasses of wine per week    Comment: 6 oz glasses/ red wine    Drug use: No    Review of Systems  Review of Systems  Constitutional:  Positive for fatigue. Negative for chills and fever.  HENT:  Negative for sore throat.   Respiratory:  Positive for shortness of breath.   Cardiovascular:  Positive for chest pain and palpitations.  Gastrointestinal:  Negative for abdominal pain.  Genitourinary:  Negative for flank pain.  Musculoskeletal:  Negative for neck pain.  Skin:  Negative for rash and wound.  Allergic/Immunologic: Negative for immunocompromised state.  Neurological:  Positive for weakness. Negative for numbness.  Hematological:  Does not bruise/bleed easily.  All other systems reviewed and are negative.   ____________________________________________  PHYSICAL EXAM:      VITAL SIGNS: ED Triage Vitals  Enc Vitals Group     BP 03/19/21 1007 (!) 77/58     Pulse Rate 03/19/21 1007 (!) 113     Resp 03/19/21 1007 20     Temp 03/19/21 1007 98 F (36.7 C)     Temp src --      SpO2 03/19/21 1007 100 %     Weight --      Height --      Head Circumference --      Peak Flow --      Pain Score 03/19/21 1013 5     Pain Loc --      Pain Edu? --      Excl. in [place]? --      Physical Exam Vitals and nursing note reviewed.  Constitutional:      General: He is not in acute distress.    Appearance: He is well-developed.  HENT:     Head: Normocephalic and atraumatic.  Eyes:     Conjunctiva/sclera: Conjunctivae normal.  Cardiovascular:     Rate and Rhythm: Regular rhythm. Tachycardia present.     Heart sounds: Normal heart sounds.  Pulmonary:     Effort: Pulmonary effort is normal. No respiratory distress.     Breath sounds: No wheezing.   Abdominal:     General: There is no distension.  Musculoskeletal:     Cervical back: Neck supple.  Skin:    General: Skin is warm.     Capillary Refill: Capillary refill takes less than 2 seconds.     Findings: No rash.  Neurological:     Mental Status: He is alert and oriented to person, place, and time.     Motor: No abnormal  muscle tone.      ____________________________________________   LABS (all labs ordered are listed, but only abnormal results are displayed)  Labs Reviewed  BASIC METABOLIC PANEL - Abnormal; Notable for the following components:      Result Value   Glucose, Bld 162 (*)    BUN 26 (*)    Creatinine, Ser 1.54 (*)    GFR, Estimated 46 (*)    All other components within normal limits  CBC - Abnormal; Notable for the following components:   RBC 5.92 (*)    All other components within normal limits  BRAIN NATRIURETIC PEPTIDE - Abnormal; Notable for the following components:   B Natriuretic Peptide 615.1 (*)    All other components within normal limits  TROPONIN I (HIGH SENSITIVITY) - Abnormal; Notable for the following components:   Troponin I (High Sensitivity) 29 (*)    All other components within normal limits  TROPONIN I (HIGH SENSITIVITY) - Abnormal; Notable for the following components:   Troponin I (High Sensitivity) 34 (*)    All other components within normal limits  TROPONIN I (HIGH SENSITIVITY) - Abnormal; Notable for the following components:   Troponin I (High Sensitivity) 58 (*)    All other components within normal limits  RESP PANEL BY RT-PCR (FLU A&B, COVID) ARPGX2  MAGNESIUM  TROPONIN I (HIGH SENSITIVITY)    ____________________________________________  EKG: Accelerated junctional rhythm versus SVT, ventricular 122.  Rhythm appears regular.  QRS 110, QTc 515.  No acute ST elevations or depressions. ________________________________________  RADIOLOGY All imaging, including plain films, CT scans, and ultrasounds, independently  reviewed by me, and interpretations confirmed via formal radiology reads.  ED MD interpretation:   Chest x-ray: No active disease  Official radiology report(s): DG Chest 2 View  Result Date: 03/19/2021 CLINICAL DATA:  Chest pain EXAM: CHEST - 2 VIEW COMPARISON:  03/15/2021 FINDINGS: The heart size and mediastinal contours are within normal limits. Atherosclerotic calcification of the aortic knob. No focal airspace consolidation, pleural effusion, or pneumothorax. Degenerative changes of the bilateral shoulders IMPRESSION: No active cardiopulmonary disease. Electronically Signed   By: Davina Poke D.O.   On: 03/19/2021 11:48    ____________________________________________  PROCEDURES   Procedure(s) performed (including Critical Care):  Procedures  ____________________________________________  INITIAL IMPRESSION / MDM / [place] / ED COURSE  As part of my medical decision making, I reviewed the following data within the [place] notes reviewed and incorporated, Old chart reviewed, Notes from prior ED visits, and [place] Controlled Substance Database       *DEEJAY KOPPELMAN was evaluated in Emergency Department on 03/19/2021 for the symptoms described in the history of present illness. He was evaluated in the context of the global COVID-19 pandemic, which necessitated consideration that the patient might be at risk for infection with the SARS-CoV-2 virus that causes COVID-19. Institutional protocols and algorithms that pertain to the evaluation of patients at risk for COVID-19 are in a state of rapid change based on information released by regulatory bodies including the CDC and federal and state organizations. These policies and algorithms were followed during the patient's care in the ED.  Some ED evaluations and interventions may be delayed as a result of limited staffing during the pandemic.*     Medical Decision Making: 80 year old male here with  recurrent supraventricular tachycardia.  Patient tachycardic and then mild distress on arrival.  He converted with diltiazem after initial dose of Lopressor did not have much effect.  EKG shows sinus bradycardia.  Chest x-ray is clear.  Lab work largely unremarkable.  No signs of ischemia on EKG. Discussed with Dr. Clayborn Bigness, who recommends starting on amiodarone for rhythm control. Admit to medicine for obs/IV load.   ____________________________________________  FINAL CLINICAL IMPRESSION(S) / ED DIAGNOSES  Final diagnoses:  SVT (supraventricular tachycardia) (H[facility])     MEDICATIONS GIVEN DURING THIS VISIT:  Medications  amiodarone (NEXTERONE) 1.8 mg/mL load via infusion 150 mg (0 mg Intravenous Hold 03/19/21 1238)    Followed by  amiodarone (NEXTERONE PREMIX) 360-4.14 MG/200ML-% (1.8 mg/mL) IV infusion (0 mg/hr Intravenous Hold 03/19/21 1239)    Followed by  amiodarone (NEXTERONE PREMIX) 360-4.14 MG/200ML-% (1.8 mg/mL) IV infusion (has no administration in time range)  aspirin EC tablet 81 mg (has no administration in time range)  atorvastatin (LIPITOR) tablet 80 mg (80 mg Oral Given 03/19/21 1537)  levothyroxine (SYNTHROID) tablet 25 mcg (has no administration in time range)  clopidogrel (PLAVIX) tablet 75 mg (75 mg Oral Given 03/19/21 1537)  acetaminophen (TYLENOL) tablet 650 mg (has no administration in time range)  ondansetron (ZOFRAN) injection 4 mg (has no administration in time range)  enoxaparin (LOVENOX) injection 40 mg (has no administration in time range)  sodium chloride 0.9 % bolus 500 mL (0 mLs Intravenous Stopped 03/19/21 1100)  metoprolol tartrate (LOPRESSOR) injection 2.5 mg (2.5 mg Intravenous Given 03/19/21 1054)  diltiazem (CARDIZEM) injection 10 mg (10 mg Intravenous Given 03/19/21 1138)  clopidogrel (PLAVIX) tablet 300 mg (300 mg Oral Given 03/19/21 1537)     ED Discharge Orders     None        Note:  This document was prepared using Dragon voice recognition  software and may include unintentional dictation errors.   Duffy Bruce, MD 03/19/21 [phone]

## 2021-03-20 DIAGNOSIS — R000 Tachycardia, unspecified: Secondary | ICD-10-CM | POA: Diagnosis not present

## 2021-03-20 DIAGNOSIS — I471 Supraventricular tachycardia: Secondary | ICD-10-CM | POA: Diagnosis not present

## 2021-03-20 DIAGNOSIS — I25110 Atherosclerotic heart disease of native coronary artery with unstable angina pectoris: Secondary | ICD-10-CM | POA: Diagnosis not present

## 2021-03-20 DIAGNOSIS — I479 Paroxysmal tachycardia, unspecified: Secondary | ICD-10-CM | POA: Diagnosis not present

## 2021-03-20 LAB — TROPONIN I (HIGH SENSITIVITY): Troponin I (High Sensitivity): 110 ng/L (ref <18)

## 2021-03-20 MED ORDER — AMIODARONE HCL 200 MG PO TABS: 200.0000 mg | ORAL_TABLET | Freq: Two times a day (BID) | ORAL | 0 refills | Status: DC

## 2021-03-20 MED ORDER — AMIODARONE HCL 200 MG PO TABS
200.0000 mg | ORAL_TABLET | Freq: Two times a day (BID) | ORAL | Status: DC
  Administered 2021-03-20: 200 mg via ORAL
  Filled 2021-03-20 (×2): qty 1

## 2021-03-20 MED ORDER — CLOPIDOGREL BISULFATE 75 MG PO TABS: 75.0000 mg | ORAL_TABLET | Freq: Every day | ORAL | 0 refills | Status: AC

## 2021-03-20 NOTE — Discharge Summary (Signed)
Physician Discharge Summary  Roger Byrd OVZ:[id number] DOB: Jul 05, 1940 DOA: 03/19/2021  PCP: Venita Lick, NP  Admit date: 03/19/2021 Discharge date: 03/20/2021  Admitted From: Home Disposition: Home  Recommendations for Outpatient Follow-up:  Follow up with PCP in 1-2 weeks Follow-up cardiology Dr. Nehemiah Massed 1 to 2 weeks  Home Health: No Equipment/Devices: None  Discharge Condition: Stable CODE STATUS: Full Diet recommendation: Heart Healthy  Brief/Interim Summary:  80 y.o. male with  medical history significant for coronary artery disease status post PCI and stent on 12/26/2020, hypertension, hypothyroidism, anxiety disorder and stage IIIa chronic kidney disease who presents to the ER for evaluation of chest tightness mostly midsternal associated with palpitations and shortness of breath. Patient has been seen in the ER multiple times for the same problem and was recently hospitalized from 09/12 - 09/13 for same. Patient states that he took an extra dose of metoprolol for palpitations this morning. States that every time he gets episodes like this he also gets very dizzy and lightheaded and feels like he is going to fall or pass out but none of that has happened.  His blood pressure was 77/58 upon arrival to the ER. He denies having any nausea, no vomiting, no diaphoresis, no leg swelling, no fever, no chills, no cough, no headache, no urinary symptoms, no changes in his bowel habits, no focal deficits or blurred vision.  Seen by cardiology.  Started on amiodarone.  Heart rate improved.  Patient remained relatively bradycardic with heart rates in the high 40s to low 50s.  Per patient this is his baseline.  Patient ambulated without difficulty.  No chest pain or palpitations endorsed.  No symptomatic tachycardia.  At time of discharge per cardiology recommendations we will discontinue home metoprolol.  Start amiodarone 200 mg p.o. twice daily.  Discontinue home Brilinta.  Start  Plavix 75 mg daily.  Follow-up outpatient PCP and cardiology.  Discharge Diagnoses:  Principal Problem:   SVT (supraventricular tachycardia) (HCC) Active Problems:   Hypothyroidism   Anemia due to stage 3a chronic kidney disease (HCC)   Hypothyroidism   Coronary artery disease involving native coronary artery of native heart  SVT/associated junctional rhythm Patient received IV Cardizem and metoprolol in ED.  Heart rate improved however patient remained relatively bradycardic.  At time of discharge per cardiology recommendations will DC on amiodarone 200 mg p.o. twice daily.  Discontinue home metoprolol.  Discontinue home Brilinta and start Plavix.  His cardiology feels this may be contributing to his arrhythmia   Discharge Instructions  Discharge Instructions     Diet - low sodium heart healthy   Complete by: As directed    Increase activity slowly   Complete by: As directed       Allergies as of 03/20/2021   No Known Allergies      Medication List     STOP taking these medications    metoprolol tartrate 25 MG tablet Commonly known as: LOPRESSOR   ticagrelor 90 MG Tabs tablet Commonly known as: BRILINTA       TAKE these medications    amiodarone 200 MG tablet Commonly known as: PACERONE Take 1 tablet (200 mg total) by mouth 2 (two) times daily.   aspirin 81 MG tablet Take 81 mg by mouth every evening.   atorvastatin 80 MG tablet Commonly known as: LIPITOR Take 1 tablet by mouth daily at 12 noon.   clopidogrel 75 MG tablet Commonly known as: PLAVIX Take 1 tablet (75 mg total) by mouth daily. Start  taking on: March 21, 2021   levothyroxine 25 MCG tablet Commonly known as: SYNTHROID Take 1 tablet (25 mcg total) by mouth daily.   valsartan 80 MG tablet Commonly known as: DIOVAN Take 40 mg by mouth at bedtime.        Follow-up Information     Venita Lick, NP. Schedule an appointment as soon as possible for a visit in 1 week(s).    Specialty: Nurse Practitioner Contact information: [address] [place] [address] [phone]         Corey Skains, MD. Schedule an appointment as soon as possible for a visit in 1 week(s).   Specialty: Cardiology Contact information: [facility] [place] [address] [phone]                No Known Allergies  Consultations: Cardiology   Procedures/Studies: DG Chest 2 View  Result Date: 03/19/2021 CLINICAL DATA:  Chest pain EXAM: CHEST - 2 VIEW COMPARISON:  03/15/2021 FINDINGS: The heart size and mediastinal contours are within normal limits. Atherosclerotic calcification of the aortic knob. No focal airspace consolidation, pleural effusion, or pneumothorax. Degenerative changes of the bilateral shoulders IMPRESSION: No active cardiopulmonary disease. Electronically Signed   By: Davina Poke D.O.   On: 03/19/2021 11:48   DG Chest 2 View  Result Date: 03/15/2021 CLINICAL DATA:  Chest pain EXAM: CHEST - 2 VIEW COMPARISON:  03/11/2021 FINDINGS: The heart size and mediastinal contours are within normal limits. Both lungs are clear. Mild degenerative changes. IMPRESSION: No active cardiopulmonary disease. Electronically Signed   By: Donavan Foil M.D.   On: 03/15/2021 18:31   DG Chest 2 View  Result Date: 03/11/2021 CLINICAL DATA:  chest tightness that started today with shob. Denies n/v. Hx of stent in June. Tachy in triage EXAM: CHEST - 2 VIEW COMPARISON:  Chest x-ray 09/15/2017. FINDINGS: The heart and mediastinal contours are unchanged. Aortic calcification. Biapical pleural/pulmonary scarring. No focal consolidation. No pulmonary edema. No pleural effusion. No pneumothorax. No acute osseous abnormality. IMPRESSION: 1. No active cardiopulmonary disease. 2.  Aortic Atherosclerosis (ICD10-I70.0). Electronically Signed   By: Iven Finn M.D.   On: 03/11/2021 19:08   (Echo, Carotid, EGD, Colonoscopy, ERCP)     Subjective: Patient seen and examined at the time of discharge.  Chest pain-free.  No palpitations.  Ambulated without issues.  Stable for discharge home.  Discharge Exam: Vitals:   03/20/21 1234 03/20/21 1235  BP: (!) 143/74 (!) 143/74  Pulse: (!) 46 (!) 46  Resp:  16  Temp:  98.2 F (36.8 C)  SpO2: 99% 99%   Vitals:   03/20/21 0524 03/20/21 0930 03/20/21 1234 03/20/21 1235  BP: (!) 146/72 (!) 149/97 (!) 143/74 (!) 143/74  Pulse: (!) 43 (!) 51 (!) 46 (!) 46  Resp: 15 16  16   Temp:    98.2 F (36.8 C)  SpO2: 98% 98% 99% 99%    General: Pt is alert, awake, not in acute distress Cardiovascular: RRR, S1/S2 +, no rubs, no gallops Respiratory: CTA bilaterally, no wheezing, no rhonchi Abdominal: Soft, NT, ND, bowel sounds + Extremities: no edema, no cyanosis    The results of significant diagnostics from this hospitalization (including imaging, microbiology, ancillary and laboratory) are listed below for reference.     Microbiology: Recent Results (from the past 240 hour(s))  Resp Panel by RT-PCR (Flu A&B, Covid) Nasopharyngeal Swab     Status: None   Collection Time: 03/12/21 12:53 AM  Specimen: Nasopharyngeal Swab; Nasopharyngeal(NP) swabs in vial transport medium  Result Value Ref Range Status   SARS Coronavirus 2 by RT PCR NEGATIVE NEGATIVE Final    Comment: (NOTE) SARS-CoV-2 target nucleic acids are NOT DETECTED.  The SARS-CoV-2 RNA is generally detectable in upper respiratory specimens during the acute phase of infection. The lowest concentration of SARS-CoV-2 viral copies this assay can detect is 138 copies/mL. A negative result does not preclude SARS-Cov-2 infection and should not be used as the sole basis for treatment or other patient management decisions. A negative result may occur with  improper specimen collection/handling, submission of specimen other than nasopharyngeal swab, presence of viral mutation(s) within the areas targeted by this assay,  and inadequate number of viral copies(<138 copies/mL). A negative result must be combined with clinical observations, patient history, and epidemiological information. The expected result is Negative.  Fact Sheet for Patients:  EntrepreneurPulse.com.au  Fact Sheet for Healthcare Providers:  IncredibleEmployment.be  This test is no t yet approved or cleared by the [place] FDA and  has been authorized for detection and/or diagnosis of SARS-CoV-2 by FDA under an Emergency Use Authorization (EUA). This EUA will remain  in effect (meaning this test can be used) for the duration of the COVID-19 declaration under Section 564(b)(1) of the Act, 21 U.S.C.section 360bbb-3(b)(1), unless the authorization is terminated  or revoked sooner.       Influenza A by PCR NEGATIVE NEGATIVE Final   Influenza B by PCR NEGATIVE NEGATIVE Final    Comment: (NOTE) The Xpert Xpress SARS-CoV-2/FLU/RSV plus assay is intended as an aid in the diagnosis of influenza from Nasopharyngeal swab specimens and should not be used as a sole basis for treatment. Nasal washings and aspirates are unacceptable for Xpert Xpress SARS-CoV-2/FLU/RSV testing.  Fact Sheet for Patients: EntrepreneurPulse.com.au  Fact Sheet for Healthcare Providers: IncredibleEmployment.be  This test is not yet approved or cleared by the [place] FDA and has been authorized for detection and/or diagnosis of SARS-CoV-2 by FDA under an Emergency Use Authorization (EUA). This EUA will remain in effect (meaning this test can be used) for the duration of the COVID-19 declaration under Section 564(b)(1) of the Act, 21 U.S.C. section 360bbb-3(b)(1), unless the authorization is terminated or revoked.  Performed at [facility], [address] [place], [place] [address]   Resp Panel by RT-PCR (Flu A&B, Covid) Nasopharyngeal Swab     Status: None    Collection Time: 03/19/21 12:35 PM   Specimen: Nasopharyngeal Swab; Nasopharyngeal(NP) swabs in vial transport medium  Result Value Ref Range Status   SARS Coronavirus 2 by RT PCR NEGATIVE NEGATIVE Final    Comment: (NOTE) SARS-CoV-2 target nucleic acids are NOT DETECTED.  The SARS-CoV-2 RNA is generally detectable in upper respiratory specimens during the acute phase of infection. The lowest concentration of SARS-CoV-2 viral copies this assay can detect is 138 copies/mL. A negative result does not preclude SARS-Cov-2 infection and should not be used as the sole basis for treatment or other patient management decisions. A negative result may occur with  improper specimen collection/handling, submission of specimen other than nasopharyngeal swab, presence of viral mutation(s) within the areas targeted by this assay, and inadequate number of viral copies(<138 copies/mL). A negative result must be combined with clinical observations, patient history, and epidemiological information. The expected result is Negative.  Fact Sheet for Patients:  EntrepreneurPulse.com.au  Fact Sheet for Healthcare Providers:  IncredibleEmployment.be  This test is no t yet approved or cleared by the [place]  States FDA and  has been authorized for detection and/or diagnosis of SARS-CoV-2 by FDA under an Emergency Use Authorization (EUA). This EUA will remain  in effect (meaning this test can be used) for the duration of the COVID-19 declaration under Section 564(b)(1) of the Act, 21 U.S.C.section 360bbb-3(b)(1), unless the authorization is terminated  or revoked sooner.       Influenza A by PCR NEGATIVE NEGATIVE Final   Influenza B by PCR NEGATIVE NEGATIVE Final    Comment: (NOTE) The Xpert Xpress SARS-CoV-2/FLU/RSV plus assay is intended as an aid in the diagnosis of influenza from Nasopharyngeal swab specimens and should not be used as a sole basis for treatment.  Nasal washings and aspirates are unacceptable for Xpert Xpress SARS-CoV-2/FLU/RSV testing.  Fact Sheet for Patients: EntrepreneurPulse.com.au  Fact Sheet for Healthcare Providers: IncredibleEmployment.be  This test is not yet approved or cleared by the [place] FDA and has been authorized for detection and/or diagnosis of SARS-CoV-2 by FDA under an Emergency Use Authorization (EUA). This EUA will remain in effect (meaning this test can be used) for the duration of the COVID-19 declaration under Section 564(b)(1) of the Act, 21 U.S.C. section 360bbb-3(b)(1), unless the authorization is terminated or revoked.  Performed at [facility], [address] [place],  [address]      Labs: BNP (last 3 results) Recent Labs    03/19/21 1014  BNP 443.1*   Basic Metabolic Panel: Recent Labs  Lab 03/15/21 1750 03/19/21 1014  NA 139 137  K 4.3 5.1  CL 106 103  CO2 24 22  GLUCOSE 115* 162*  BUN 26* 26*  CREATININE 1.33* 1.54*  CALCIUM 9.2 9.2  MG  --  2.1   Liver Function Tests: No results for input(s): AST, ALT, ALKPHOS, BILITOT, PROT, ALBUMIN in the last 168 hours. No results for input(s): LIPASE, AMYLASE in the last 168 hours. No results for input(s): AMMONIA in the last 168 hours. CBC: Recent Labs  Lab 03/15/21 1750 03/19/21 1014  WBC 8.8 9.6  HGB 13.9 15.8  HCT 42.2 47.5  MCV 81.2 80.2  PLT 312 390   Cardiac Enzymes: No results for input(s): CKTOTAL, CKMB, CKMBINDEX, TROPONINI in the last 168 hours. BNP: Invalid input(s): POCBNP CBG: No results for input(s): GLUCAP in the last 168 hours. D-Dimer No results for input(s): DDIMER in the last 72 hours. Hgb A1c No results for input(s): HGBA1C in the last 72 hours. Lipid Profile No results for input(s): CHOL, HDL, LDLCALC, TRIG, CHOLHDL, LDLDIRECT in the last 72 hours. Thyroid function studies No results for input(s): TSH, T4TOTAL, T3FREE, THYROIDAB in  the last 72 hours.  Invalid input(s): FREET3 Anemia work up No results for input(s): VITAMINB12, FOLATE, FERRITIN, TIBC, IRON, RETICCTPCT in the last 72 hours. Urinalysis    Component Value Date/Time   COLORURINE YELLOW (A) 05/10/2018 1540   APPEARANCEUR CLEAR (A) 05/10/2018 1540   APPEARANCEUR Clear 02/23/2018 1418   LABSPEC 1.017 05/10/2018 1540   PHURINE 5.0 05/10/2018 1540   GLUCOSEU 150 (A) 05/10/2018 1540   HGBUR NEGATIVE 05/10/2018 1540   BILIRUBINUR NEGATIVE 05/10/2018 1540   BILIRUBINUR Negative 02/23/2018 1418   KETONESUR NEGATIVE 05/10/2018 1540   PROTEINUR NEGATIVE 05/10/2018 1540   NITRITE NEGATIVE 05/10/2018 1540   LEUKOCYTESUR NEGATIVE 05/10/2018 1540   LEUKOCYTESUR Negative 02/23/2018 1418   Sepsis Labs Invalid input(s): PROCALCITONIN,  WBC,  LACTICIDVEN Microbiology Recent Results (from the past 240 hour(s))  Resp Panel by RT-PCR (Flu A&B, Covid) Nasopharyngeal Swab  Status: None   Collection Time: 03/12/21 12:53 AM   Specimen: Nasopharyngeal Swab; Nasopharyngeal(NP) swabs in vial transport medium  Result Value Ref Range Status   SARS Coronavirus 2 by RT PCR NEGATIVE NEGATIVE Final    Comment: (NOTE) SARS-CoV-2 target nucleic acids are NOT DETECTED.  The SARS-CoV-2 RNA is generally detectable in upper respiratory specimens during the acute phase of infection. The lowest concentration of SARS-CoV-2 viral copies this assay can detect is 138 copies/mL. A negative result does not preclude SARS-Cov-2 infection and should not be used as the sole basis for treatment or other patient management decisions. A negative result may occur with  improper specimen collection/handling, submission of specimen other than nasopharyngeal swab, presence of viral mutation(s) within the areas targeted by this assay, and inadequate number of viral copies(<138 copies/mL). A negative result must be combined with clinical observations, patient history, and  epidemiological information. The expected result is Negative.  Fact Sheet for Patients:  EntrepreneurPulse.com.au  Fact Sheet for Healthcare Providers:  IncredibleEmployment.be  This test is no t yet approved or cleared by the [place] FDA and  has been authorized for detection and/or diagnosis of SARS-CoV-2 by FDA under an Emergency Use Authorization (EUA). This EUA will remain  in effect (meaning this test can be used) for the duration of the COVID-19 declaration under Section 564(b)(1) of the Act, 21 U.S.C.section 360bbb-3(b)(1), unless the authorization is terminated  or revoked sooner.       Influenza A by PCR NEGATIVE NEGATIVE Final   Influenza B by PCR NEGATIVE NEGATIVE Final    Comment: (NOTE) The Xpert Xpress SARS-CoV-2/FLU/RSV plus assay is intended as an aid in the diagnosis of influenza from Nasopharyngeal swab specimens and should not be used as a sole basis for treatment. Nasal washings and aspirates are unacceptable for Xpert Xpress SARS-CoV-2/FLU/RSV testing.  Fact Sheet for Patients: EntrepreneurPulse.com.au  Fact Sheet for Healthcare Providers: IncredibleEmployment.be  This test is not yet approved or cleared by the [place] FDA and has been authorized for detection and/or diagnosis of SARS-CoV-2 by FDA under an Emergency Use Authorization (EUA). This EUA will remain in effect (meaning this test can be used) for the duration of the COVID-19 declaration under Section 564(b)(1) of the Act, 21 U.S.C. section 360bbb-3(b)(1), unless the authorization is terminated or revoked.  Performed at [facility], [address] [place], [place] [address]   Resp Panel by RT-PCR (Flu A&B, Covid) Nasopharyngeal Swab     Status: None   Collection Time: 03/19/21 12:35 PM   Specimen: Nasopharyngeal Swab; Nasopharyngeal(NP) swabs in vial transport medium  Result Value Ref Range  Status   SARS Coronavirus 2 by RT PCR NEGATIVE NEGATIVE Final    Comment: (NOTE) SARS-CoV-2 target nucleic acids are NOT DETECTED.  The SARS-CoV-2 RNA is generally detectable in upper respiratory specimens during the acute phase of infection. The lowest concentration of SARS-CoV-2 viral copies this assay can detect is 138 copies/mL. A negative result does not preclude SARS-Cov-2 infection and should not be used as the sole basis for treatment or other patient management decisions. A negative result may occur with  improper specimen collection/handling, submission of specimen other than nasopharyngeal swab, presence of viral mutation(s) within the areas targeted by this assay, and inadequate number of viral copies(<138 copies/mL). A negative result must be combined with clinical observations, patient history, and epidemiological information. The expected result is Negative.  Fact Sheet for Patients:  EntrepreneurPulse.com.au  Fact Sheet for Healthcare Providers:  IncredibleEmployment.be  This  test is no t yet approved or cleared by the [place] and  has been authorized for detection and/or diagnosis of SARS-CoV-2 by FDA under an Emergency Use Authorization (EUA). This EUA will remain  in effect (meaning this test can be used) for the duration of the COVID-19 declaration under Section 564(b)(1) of the Act, 21 U.S.C.section 360bbb-3(b)(1), unless the authorization is terminated  or revoked sooner.       Influenza A by PCR NEGATIVE NEGATIVE Final   Influenza B by PCR NEGATIVE NEGATIVE Final    Comment: (NOTE) The Xpert Xpress SARS-CoV-2/FLU/RSV plus assay is intended as an aid in the diagnosis of influenza from Nasopharyngeal swab specimens and should not be used as a sole basis for treatment. Nasal washings and aspirates are unacceptable for Xpert Xpress SARS-CoV-2/FLU/RSV testing.  Fact Sheet for  Patients: EntrepreneurPulse.com.au  Fact Sheet for Healthcare Providers: IncredibleEmployment.be  This test is not yet approved or cleared by the [place] FDA and has been authorized for detection and/or diagnosis of SARS-CoV-2 by FDA under an Emergency Use Authorization (EUA). This EUA will remain in effect (meaning this test can be used) for the duration of the COVID-19 declaration under Section 564(b)(1) of the Act, 21 U.S.C. section 360bbb-3(b)(1), unless the authorization is terminated or revoked.  Performed at [facility], [address], [place], [place] [address]      Time coordinating discharge: Over 30 minutes  SIGNED:   Sidney Ace, MD  [facility] 03/20/2021, 2:59 PM Pager   If 7PM-7AM, please contact night-coverage

## 2021-03-20 NOTE — Progress Notes (Signed)
PT Cancellation Note  Patient Details Name: Roger Byrd MRN: [id number] DOB: 11/07/1940   Cancelled Treatment:    Reason Eval/Treat Not Completed: PT screened, no needs identified, will sign off Spoke with pt and nurse.  Pt had just completed easily walking a loop around the ED w/o AD and w/o issue.  His telemetry HR was in the mid 40s during our discussion, reports this is close to his normal.  He states that he walks ~1 mile every morning, plays 18 holes of golf 3x/week and that his walk just now felt like his baseline w/o fatigue, shortness of breath, stagger stepping, etc.  Pt does not appear to have any PT needs and agrees that he is at/close to his baseline. Will complete PT orders and sign off.  Kreg Shropshire, DPT 03/20/2021, 12:16 PM

## 2021-03-20 NOTE — ED Notes (Signed)
No change in condition, will continue to monitor.  

## 2021-03-20 NOTE — ED Notes (Signed)
Dr. Clayborn Bigness in room at this time. NAD. VSS. Call light within reach. Will continue to monitor.

## 2021-03-20 NOTE — Progress Notes (Signed)
[facility] Cardiology    SUBJECTIVE: Patient states he feels reasonably well improved heart rate denies any palpitation tachycardia denies any bloody stools or syncope   Vitals:   03/20/21 0524 03/20/21 0930 03/20/21 1234 03/20/21 1235  BP: (!) 146/72 (!) 149/97 (!) 143/74 (!) 143/74  Pulse: (!) 43 (!) 51 (!) 46 (!) 46  Resp: 15 16  16   Temp:    98.2 F (36.8 C)  SpO2: 98% 98% 99% 99%    No intake or output data in the 24 hours ending 03/20/21 1238    PHYSICAL EXAM  General: Well developed, well nourished, in no acute distress HEENT:  Normocephalic and atramatic Neck:  No JVD.  Lungs: Clear bilaterally to auscultation and percussion. Heart sinus bradycardia rate of 60. Normal S1 and S2 without gallops or murmurs.  Abdomen: Bowel sounds are positive, abdomen soft and non-tender  Msk:  Back normal, normal gait. Normal strength and tone for age. Extremities: No clubbing, cyanosis or edema.   Neuro: Alert and oriented X 3. Psych:  Good affect, responds appropriately   LABS: Basic Metabolic Panel: Recent Labs    03/19/21 1014  NA 137  K 5.1  CL 103  CO2 22  GLUCOSE 162*  BUN 26*  CREATININE 1.54*  CALCIUM 9.2  MG 2.1   Liver Function Tests: No results for input(s): AST, ALT, ALKPHOS, BILITOT, PROT, ALBUMIN in the last 72 hours. No results for input(s): LIPASE, AMYLASE in the last 72 hours. CBC: Recent Labs    03/19/21 1014  WBC 9.6  HGB 15.8  HCT 47.5  MCV 80.2  PLT 390   Cardiac Enzymes: No results for input(s): CKTOTAL, CKMB, CKMBINDEX, TROPONINI in the last 72 hours. BNP: Invalid input(s): POCBNP D-Dimer: No results for input(s): DDIMER in the last 72 hours. Hemoglobin A1C: No results for input(s): HGBA1C in the last 72 hours. Fasting Lipid Panel: No results for input(s): CHOL, HDL, LDLCALC, TRIG, CHOLHDL, LDLDIRECT in the last 72 hours. Thyroid Function Tests: No results for input(s): TSH, T4TOTAL, T3FREE, THYROIDAB in the last 72 hours.  Invalid  input(s): FREET3 Anemia Panel: No results for input(s): VITAMINB12, FOLATE, FERRITIN, TIBC, IRON, RETICCTPCT in the last 72 hours.  DG Chest 2 View  Result Date: 03/19/2021 CLINICAL DATA:  Chest pain EXAM: CHEST - 2 VIEW COMPARISON:  03/15/2021 FINDINGS: The heart size and mediastinal contours are within normal limits. Atherosclerotic calcification of the aortic knob. No focal airspace consolidation, pleural effusion, or pneumothorax. Degenerative changes of the bilateral shoulders IMPRESSION: No active cardiopulmonary disease. Electronically Signed   By: Davina Poke D.O.   On: 03/19/2021 11:48     Echo pending  TELEMETRY: Normal sinus rhythm bradycardia rate of 60:  ASSESSMENT AND PLAN:  Principal Problem:   SVT (supraventricular tachycardia) (HCC) Active Problems:   Hypothyroidism   Anemia due to stage 3a chronic kidney disease (HCC)   Hypothyroidism   Coronary artery disease involving native coronary artery of native heart    Plan Improved tachycardia recommend maintaining oral amiodarone metoprolol No no clear indication for anticoagulation Coronary disease by history denies any recent angina no shortness of breath continue current therapy Presents safe for early discharge on amiodarone and low-dose metoprolol  Follow-up with cardiology as an outpatient 1 to 2 weeks    Yolonda Kida, MD 03/20/2021 12:38 PM

## 2021-03-20 NOTE — ED Notes (Signed)
Secure chat Dr. Judd Gaudier in regards to troponin lab results, most recent over 03/19/21  at 2020 to inquire about further troponin AM lab orders. Pt currently chest pain and SOB free.

## 2021-03-20 NOTE — ED Notes (Signed)
Pt up walking in the hall at this time.

## 2021-03-20 NOTE — ED Notes (Signed)
Pt awake in room watching TV. VSS. Call light within reach. Family at bedside. No concerns or needs voiced. Will continue to monitor for changes.

## 2021-03-20 NOTE — ED Notes (Signed)
Pt resting in beds. NAD. No concerns or needs voiced. Call light within reach. Will continue to monitor for changes.

## 2021-03-20 NOTE — ED Notes (Signed)
Report received from end-Amy RN. Patient care assumed. Patient/RN introduction complete. Will continue to monitor.

## 2021-03-20 NOTE — ED Notes (Signed)
Pt is resting in room, respirations are equal and non labored. Call light with in reach. Will continue to monitor for changes.

## 2021-03-21 DIAGNOSIS — I2510 Atherosclerotic heart disease of native coronary artery without angina pectoris: Secondary | ICD-10-CM | POA: Diagnosis not present

## 2021-03-21 DIAGNOSIS — E782 Mixed hyperlipidemia: Secondary | ICD-10-CM | POA: Diagnosis not present

## 2021-03-21 DIAGNOSIS — I10 Essential (primary) hypertension: Secondary | ICD-10-CM | POA: Diagnosis not present

## 2021-04-25 DIAGNOSIS — I2510 Atherosclerotic heart disease of native coronary artery without angina pectoris: Secondary | ICD-10-CM | POA: Diagnosis not present

## 2021-04-25 DIAGNOSIS — E782 Mixed hyperlipidemia: Secondary | ICD-10-CM | POA: Diagnosis not present

## 2021-04-25 DIAGNOSIS — I10 Essential (primary) hypertension: Secondary | ICD-10-CM | POA: Diagnosis not present

## 2021-04-25 DIAGNOSIS — N1831 Chronic kidney disease, stage 3a: Secondary | ICD-10-CM | POA: Diagnosis not present

## 2021-05-02 DIAGNOSIS — R809 Proteinuria, unspecified: Secondary | ICD-10-CM | POA: Diagnosis not present

## 2021-05-02 DIAGNOSIS — I10 Essential (primary) hypertension: Secondary | ICD-10-CM | POA: Diagnosis not present

## 2021-05-02 DIAGNOSIS — N1831 Chronic kidney disease, stage 3a: Secondary | ICD-10-CM | POA: Diagnosis not present

## 2021-05-02 DIAGNOSIS — D631 Anemia in chronic kidney disease: Secondary | ICD-10-CM | POA: Diagnosis not present

## 2021-05-10 ENCOUNTER — Ambulatory Visit (INDEPENDENT_AMBULATORY_CARE_PROVIDER_SITE_OTHER): Payer: Medicare Other

## 2021-05-10 ENCOUNTER — Other Ambulatory Visit: Payer: Self-pay

## 2021-05-10 DIAGNOSIS — Z23 Encounter for immunization: Secondary | ICD-10-CM | POA: Diagnosis not present

## 2021-08-18 NOTE — Patient Instructions (Signed)

## 2021-08-22 ENCOUNTER — Encounter: Payer: Self-pay | Admitting: Nurse Practitioner

## 2021-08-22 ENCOUNTER — Other Ambulatory Visit: Payer: Self-pay

## 2021-08-22 ENCOUNTER — Ambulatory Visit (INDEPENDENT_AMBULATORY_CARE_PROVIDER_SITE_OTHER): Payer: Medicare Other | Admitting: Nurse Practitioner

## 2021-08-22 VITALS — BP 124/74 | HR 52 | Temp 97.7°F | Ht 68.0 in | Wt 150.0 lb

## 2021-08-22 DIAGNOSIS — I471 Supraventricular tachycardia: Secondary | ICD-10-CM

## 2021-08-22 DIAGNOSIS — D631 Anemia in chronic kidney disease: Secondary | ICD-10-CM

## 2021-08-22 DIAGNOSIS — I252 Old myocardial infarction: Secondary | ICD-10-CM

## 2021-08-22 DIAGNOSIS — R7301 Impaired fasting glucose: Secondary | ICD-10-CM | POA: Diagnosis not present

## 2021-08-22 DIAGNOSIS — I2510 Atherosclerotic heart disease of native coronary artery without angina pectoris: Secondary | ICD-10-CM

## 2021-08-22 DIAGNOSIS — E782 Mixed hyperlipidemia: Secondary | ICD-10-CM

## 2021-08-22 DIAGNOSIS — I10 Essential (primary) hypertension: Secondary | ICD-10-CM

## 2021-08-22 DIAGNOSIS — N1831 Chronic kidney disease, stage 3a: Secondary | ICD-10-CM | POA: Diagnosis not present

## 2021-08-22 DIAGNOSIS — E039 Hypothyroidism, unspecified: Secondary | ICD-10-CM | POA: Diagnosis not present

## 2021-08-22 LAB — MICROALBUMIN, URINE WAIVED
Creatinine, Urine Waived: 200 mg/dL (ref 10–300)
Microalb, Ur Waived: 80 mg/L — ABNORMAL HIGH (ref 0–19)

## 2021-08-22 LAB — BAYER DCA HB A1C WAIVED: HB A1C (BAYER DCA - WAIVED): 5.7 % — ABNORMAL HIGH (ref 4.8–5.6)

## 2021-08-22 MED ORDER — AMIODARONE HCL 200 MG PO TABS: 100.0000 mg | ORAL_TABLET | Freq: Every day | ORAL | 4 refills | Status: DC

## 2021-08-22 NOTE — Assessment & Plan Note (Signed)
Chronic, ongoing with initial BP elevated on exam today, but repeat at goal and close to home readings.  Average at home 130/70 with occasional higher or lower readings. At this time continue current medication regimen and adjust as needed. Recommend continue to monitor BP at home regularly and document for provider + focus on DASH diet.  Labs today.  Return in 6 months, continue to collaborate with cardiology.

## 2021-08-22 NOTE — Assessment & Plan Note (Signed)
Chronic, ongoing.  At this time continue current Levothyroxine dosing and adjust as needed based on labs.  Monitor closely with Amiodarone use.

## 2021-08-22 NOTE — Assessment & Plan Note (Signed)
Chronic, ongoing, followed by nephrology.  Continue this collaboration and current medication regimen, ARB on board for kidney protection.   CMP, CBC today.  Urine ALB 80 on check today (February 2023).  Return in 6 months.

## 2021-08-22 NOTE — Assessment & Plan Note (Signed)
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  Continue collaboration with cardiology and current medication regimen as prescribed by them.  Lipid panel today. 

## 2021-08-22 NOTE — Assessment & Plan Note (Signed)
Chronic, stable at this time with no symptoms.  Continue collaboration with cardiology.  Recent notes reviewed. 

## 2021-08-22 NOTE — Assessment & Plan Note (Signed)
NSTEMI, (inferior myocardial infarction), on 12/25/20 with heart cath.  At this time continue current medication regimen and collaboration with cardiology.  Lipid panel today. 

## 2021-08-22 NOTE — Assessment & Plan Note (Signed)
Chronic, stable at this time with improved labs in August 2022.  Continue collaboration with hematology as needed only and nephrology as scheduled + current medication regimen.   

## 2021-08-22 NOTE — Progress Notes (Signed)
BP 124/74    Pulse (!) 52    Temp 97.7 F (36.5 C) (Oral)    Ht 5\' 8"  (1.727 m)    Wt 150 lb (68 kg)    SpO2 96%    BMI 22.81 kg/m    Subjective:    Patient ID: Roger Byrd, male    DOB: 1940/10/10, 81 y.o.   MRN: [id number]  HPI: Roger Byrd is a 81 y.o. male  Chief Complaint  Patient presents with   Hyperlipidemia   Hypertension   Chronic Kidney Disease   Hypothyroidism     HYPERTENSION / HYPERLIPIDEMIA Follow-up today.  Had NSTEMI on 12/25/20 (inferior myocardial infarction), admitted to hospital and left heart cath performed with stent placement.  Taking Valsartan 40 MG daily, ASA, Amiodarone, Plavix, Atorvastatin.    Saw cardiology, Dr. Nehemiah Massed, on 04/25/21 (reduced Amiodarone to 100 MG daily) and returns today to see him.  Had stress test and echo on 02/07/21 = EF >55% with normal LV function and mild LVH.     Recent A1c mild elevation at 5.9% in June 2022. Satisfied with current treatment? yes Duration of hypertension: chronic BP monitoring frequency: daily BP range: average 130/70  BP medication side effects: no Duration of hyperlipidemia: chronic Cholesterol medication side effects: no Cholesterol supplements: none Medication compliance: good compliance Aspirin: yes Recent stressors: no Recurrent headaches: no Visual changes: no Palpitations: no Dyspnea: no Chest pain: no Lower extremity edema: no Dizzy/lightheaded: no    CHRONIC KIDNEY DISEASE Followed by nephrology, saw them last 05/02/21 with labs and visit. Sees every 6 months.  Labs at time-- CRT 1.31 and GFR 55, PTH 40.   Saw hematology for anemia with CKD, is currently off iron tablets -- last saw 02/06/21 -- to follow-up as needed only. CKD status: stable Medications renally dose: yes Previous renal evaluation: yes Pneumovax:  Up to Date Influenza Vaccine:  Up to Date   HYPOTHYROIDISM Was on Levothyroxine 25 MCG. Thyroid control status:stable Satisfied with current treatment?  yes Medication side effects: no Medication compliance: good compliance Etiology of hypothyroidism:  Recent dose adjustment:no Fatigue: no Cold intolerance: no Heat intolerance: no Weight gain: no Weight loss: no Constipation: no Diarrhea/loose stools: no Palpitations: no Lower extremity edema: no Anxiety/depressed mood: no   Relevant past medical, surgical, family and social history reviewed and updated as indicated. Interim medical history since our last visit reviewed. Allergies and medications reviewed and updated.  Review of Systems  Constitutional:  Negative for activity change, diaphoresis, fatigue and fever.  Respiratory:  Negative for cough, chest tightness, shortness of breath and wheezing.   Cardiovascular:  Negative for chest pain, palpitations and leg swelling.  Gastrointestinal: Negative.   Endocrine: Negative for cold intolerance, heat intolerance, polydipsia, polyphagia and polyuria.  Skin: Negative.   Psychiatric/Behavioral: Negative.     Per HPI unless specifically indicated above     Objective:    BP 124/74    Pulse (!) 52    Temp 97.7 F (36.5 C) (Oral)    Ht 5\' 8"  (1.727 m)    Wt 150 lb (68 kg)    SpO2 96%    BMI 22.81 kg/m   Wt Readings from Last 3 Encounters:  08/22/21 150 lb (68 kg)  03/15/21 145 lb 11.6 oz (66.1 kg)  03/12/21 145 lb 11.6 oz (66.1 kg)    Physical Exam Vitals and nursing note reviewed.  Constitutional:      General: He is awake. He is not in acute  distress.    Appearance: He is well-developed and well-groomed. He is not ill-appearing or toxic-appearing.  HENT:     Head: Normocephalic and atraumatic.     Right Ear: Hearing, tympanic membrane, ear canal and external ear normal. No drainage.     Left Ear: Hearing, tympanic membrane, ear canal and external ear normal. No drainage.  Eyes:     General: Lids are normal.        Right eye: No discharge.        Left eye: No discharge.     Conjunctiva/sclera: Conjunctivae normal.      Pupils: Pupils are equal, round, and reactive to light.  Neck:     Thyroid: No thyromegaly.     Vascular: No carotid bruit.     Trachea: Trachea normal.  Cardiovascular:     Rate and Rhythm: Normal rate and regular rhythm.     Heart sounds: Normal heart sounds, S1 normal and S2 normal. No murmur heard.   No gallop.  Pulmonary:     Effort: Pulmonary effort is normal. No accessory muscle usage or respiratory distress.     Breath sounds: Normal breath sounds.  Abdominal:     General: Bowel sounds are normal.     Palpations: Abdomen is soft. There is no hepatomegaly or splenomegaly.  Musculoskeletal:        General: Normal range of motion.     Cervical back: Normal range of motion and neck supple.     Right lower leg: No edema.     Left lower leg: No edema.  Skin:    General: Skin is warm and dry.     Capillary Refill: Capillary refill takes less than 2 seconds.     Findings: No rash.  Neurological:     Mental Status: He is alert and oriented to person, place, and time.     Deep Tendon Reflexes: Reflexes are normal and symmetric.  Psychiatric:        Attention and Perception: Attention normal.        Mood and Affect: Mood normal.        Speech: Speech normal.        Behavior: Behavior normal. Behavior is cooperative.        Thought Content: Thought content normal.    Results for orders placed or performed during the hospital encounter of 03/19/21  Resp Panel by RT-PCR (Flu A&B, Covid) Nasopharyngeal Swab   Specimen: Nasopharyngeal Swab; Nasopharyngeal(NP) swabs in vial transport medium  Result Value Ref Range   SARS Coronavirus 2 by RT PCR NEGATIVE NEGATIVE   Influenza A by PCR NEGATIVE NEGATIVE   Influenza B by PCR NEGATIVE NEGATIVE  Basic metabolic panel  Result Value Ref Range   Sodium 137 135 - 145 mmol/L   Potassium 5.1 3.5 - 5.1 mmol/L   Chloride 103 98 - 111 mmol/L   CO2 22 22 - 32 mmol/L   Glucose, Bld 162 (H) 70 - 99 mg/dL   BUN 26 (H) 8 - 23 mg/dL   Creatinine,  Ser 1.54 (H) 0.61 - 1.24 mg/dL   Calcium 9.2 8.9 - 10.3 mg/dL   GFR, Estimated 46 (L) >60 mL/min   Anion gap 12 5 - 15  CBC  Result Value Ref Range   WBC 9.6 4.0 - 10.5 K/uL   RBC 5.92 (H) 4.22 - 5.81 MIL/uL   Hemoglobin 15.8 13.0 - 17.0 g/dL   HCT 47.5 39.0 - 52.0 %   MCV 80.2 80.0 - 100.0  fL   MCH 26.7 26.0 - 34.0 pg   MCHC 33.3 30.0 - 36.0 g/dL   RDW 14.0 11.5 - 15.5 %   Platelets 390 150 - 400 K/uL   nRBC 0.0 0.0 - 0.2 %  Magnesium  Result Value Ref Range   Magnesium 2.1 1.7 - 2.4 mg/dL  Brain natriuretic peptide  Result Value Ref Range   B Natriuretic Peptide 615.1 (H) 0.0 - 100.0 pg/mL  Troponin I (High Sensitivity)  Result Value Ref Range   Troponin I (High Sensitivity) 29 (H) <18 ng/L  Troponin I (High Sensitivity)  Result Value Ref Range   Troponin I (High Sensitivity) 34 (H) <18 ng/L  Troponin I (High Sensitivity)  Result Value Ref Range   Troponin I (High Sensitivity) 58 (H) <18 ng/L  Troponin I (High Sensitivity)  Result Value Ref Range   Troponin I (High Sensitivity) 102 (HH) <18 ng/L  Troponin I (High Sensitivity)  Result Value Ref Range   Troponin I (High Sensitivity) 111 (HH) <18 ng/L  Troponin I (High Sensitivity)  Result Value Ref Range   Troponin I (High Sensitivity) 134 (HH) <18 ng/L  Troponin I (High Sensitivity)  Result Value Ref Range   Troponin I (High Sensitivity) 110 (HH) <18 ng/L      Assessment & Plan:   Problem List Items Addressed This Visit       Cardiovascular and Mediastinum   Coronary artery disease involving native coronary artery of native heart    NSTEMI, (inferior myocardial infarction), on 12/25/20 with heart cath.  At this time continue current medication regimen and collaboration with cardiology.  Lipid panel today.      Relevant Medications   amiodarone (PACERONE) 200 MG tablet   Hypertension    Chronic, ongoing with initial BP elevated on exam today, but repeat at goal and close to home readings.  Average at home  130/70 with occasional higher or lower readings. At this time continue current medication regimen and adjust as needed. Recommend continue to monitor BP at home regularly and document for provider + focus on DASH diet.  Labs today.  Return in 6 months, continue to collaborate with cardiology.      Relevant Medications   amiodarone (PACERONE) 200 MG tablet   Other Relevant Orders   Microalbumin, Urine Waived   SVT (supraventricular tachycardia) (HCC) - Primary    Chronic, stable at this time with no symptoms.  Continue collaboration with cardiology.  Recent notes reviewed.      Relevant Medications   amiodarone (PACERONE) 200 MG tablet     Endocrine   Hypothyroidism    Chronic, ongoing.  At this time continue current Levothyroxine dosing and adjust as needed based on labs.  Monitor closely with Amiodarone use.      Relevant Orders   T4, free   TSH   IFG (impaired fasting glucose)    A1c trend down to 5.7%, recommend diet focus and plan to recheck Q6MOS.      Relevant Orders   Bayer DCA Hb A1c Waived   Microalbumin, Urine Waived     Genitourinary   Anemia due to stage 3a chronic kidney disease ([place])    Chronic, stable at this time with improved labs in August 2022.  Continue collaboration with hematology as needed only and nephrology as scheduled + current medication regimen.        CKD (chronic kidney disease)    Chronic, ongoing, followed by nephrology.  Continue this collaboration and current medication regimen,  ARB on board for kidney protection.   CMP, CBC today.  Urine ALB 80 on check today (February 2023).  Return in 6 months.      Relevant Orders   Microalbumin, Urine Waived     Other   History of non-ST elevation myocardial infarction (NSTEMI)    On 12/25/20, (inferior myocardial infarction).  Continue collaboration with cardiology and current medication regimen as prescribed by them.  Lipid panel today.      Hyperlipidemia    Chronic, ongoing.  Continue current  medication regimen and adjust as needed.  Lipid panel today.      Relevant Medications   amiodarone (PACERONE) 200 MG tablet   Other Relevant Orders   Lipid Panel w/o Chol/HDL Ratio     Follow up plan: Return in about 6 months (around 02/19/2022) for Annual physical.

## 2021-08-22 NOTE — Assessment & Plan Note (Signed)
Chronic, ongoing.  Continue current medication regimen and adjust as needed. Lipid panel today. 

## 2021-08-22 NOTE — Assessment & Plan Note (Addendum)
A1c trend down to 5.7%, recommend diet focus and plan to recheck Q6MOS.

## 2021-08-23 ENCOUNTER — Telehealth: Payer: Self-pay

## 2021-08-23 LAB — LIPID PANEL W/O CHOL/HDL RATIO
Cholesterol, Total: 152 mg/dL (ref 100–199)
HDL: 50 mg/dL (ref >39)
LDL Chol Calc (NIH): 89 mg/dL (ref 0–99)
Triglycerides: 67 mg/dL (ref 0–149)
VLDL Cholesterol Cal: 13 mg/dL (ref 5–40)

## 2021-08-23 LAB — TSH: TSH: 6.34 u[IU]/mL — ABNORMAL HIGH (ref 0.450–4.500)

## 2021-08-23 LAB — T4, FREE: Free T4: 1.48 ng/dL (ref 0.82–1.77)

## 2021-08-23 MED ORDER — LEVOTHYROXINE SODIUM 50 MCG PO TABS: 50.0000 ug | ORAL_TABLET | Freq: Every day | ORAL | 3 refills | Status: DC

## 2021-08-23 NOTE — Addendum Note (Signed)
Addended by: Marnee Guarneri T on: 08/23/2021 01:12 PM   Modules accepted: Orders

## 2021-08-23 NOTE — Telephone Encounter (Signed)
Patient returned our call regarding labs results.  Results shared per Ms. Cannady's note.  Pt already contacted by pharmacy regarding dosage change. Pt needs appointment for lab follow up.

## 2021-08-23 NOTE — Progress Notes (Signed)
Contacted via [place] -- please call to ensure he receives below message: Good afternoon Jax, your labs have returned and thyroid labs are elevated still.  I am going to increase your Levothyroxine to 50 MCG and would like you to return in 6 weeks for lab visit only to recheck, please schedule lab only visit.  Cholesterol levels are stable.  Any questions? Keep being amazing!!  Thank you for allowing me to participate in your care.  I appreciate you. Kindest regards, Trenese Haft

## 2021-08-26 ENCOUNTER — Telehealth: Payer: Self-pay | Admitting: Nurse Practitioner

## 2021-08-26 NOTE — Telephone Encounter (Signed)
Pt needs to reschedule AWV

## 2021-09-02 DIAGNOSIS — I10 Essential (primary) hypertension: Secondary | ICD-10-CM | POA: Diagnosis not present

## 2021-09-02 DIAGNOSIS — N1831 Chronic kidney disease, stage 3a: Secondary | ICD-10-CM | POA: Diagnosis not present

## 2021-09-02 DIAGNOSIS — N1832 Chronic kidney disease, stage 3b: Secondary | ICD-10-CM | POA: Diagnosis not present

## 2021-09-02 DIAGNOSIS — D631 Anemia in chronic kidney disease: Secondary | ICD-10-CM | POA: Diagnosis not present

## 2021-09-02 DIAGNOSIS — R809 Proteinuria, unspecified: Secondary | ICD-10-CM | POA: Diagnosis not present

## 2021-10-04 ENCOUNTER — Other Ambulatory Visit: Payer: Medicare Other

## 2021-10-07 ENCOUNTER — Other Ambulatory Visit: Payer: Medicare Other

## 2021-10-07 DIAGNOSIS — E039 Hypothyroidism, unspecified: Secondary | ICD-10-CM | POA: Diagnosis not present

## 2021-10-08 ENCOUNTER — Other Ambulatory Visit: Payer: Self-pay | Admitting: Nurse Practitioner

## 2021-10-08 LAB — TSH: TSH: 5.67 u[IU]/mL — ABNORMAL HIGH (ref 0.450–4.500)

## 2021-10-08 LAB — T4, FREE: Free T4: 1.39 ng/dL (ref 0.82–1.77)

## 2021-10-08 MED ORDER — LEVOTHYROXINE SODIUM 75 MCG PO TABS: 75.0000 ug | ORAL_TABLET | Freq: Every day | ORAL | 3 refills | Status: DC

## 2021-10-08 NOTE — Progress Notes (Signed)
Contacted via MyChart -- need 6 week lab visit only please ? ? ? ?Good morning Roger Byrd, your labs have returned -- thyroid levels = TSH still a bit elevated about goal and Free T4 normal.  I would like to increase Levothyroxine to 75 MCG daily and have you come back in 6 weeks for recheck again.  Please ensure you are taking first thing in morning prior to other medications or food.  Any questions? ?Keep being amazing!!  Thank you for allowing me to participate in your care.  I appreciate you. ?Kindest regards, ?Abel Hageman ?

## 2021-11-28 ENCOUNTER — Other Ambulatory Visit: Payer: Medicare Other

## 2021-11-28 DIAGNOSIS — E039 Hypothyroidism, unspecified: Secondary | ICD-10-CM

## 2021-11-29 LAB — T4, FREE: Free T4: 1.56 ng/dL (ref 0.82–1.77)

## 2021-11-29 LAB — TSH: TSH: 2.32 u[IU]/mL (ref 0.450–4.500)

## 2021-11-29 NOTE — Progress Notes (Signed)
Sorry, meant to say no medication changes needed:)

## 2021-11-29 NOTE — Progress Notes (Signed)
Contacted via MyChart   Thyroid labs are normal this check, no medication needed, we will continue to monitor:) Have a wonderful day!!

## 2021-12-05 ENCOUNTER — Ambulatory Visit (INDEPENDENT_AMBULATORY_CARE_PROVIDER_SITE_OTHER): Payer: Medicare Other | Admitting: *Deleted

## 2021-12-05 DIAGNOSIS — Z0000 Encounter for general adult medical examination without abnormal findings: Secondary | ICD-10-CM

## 2021-12-05 NOTE — Progress Notes (Cosign Needed)
Subjective:   Roger Byrd is a 81 y.o. male who presents for Medicare Annual/Subsequent preventive examination I connected with  [name] on 12/05/21 by a telephone enabled telemedicine application and verified that I am speaking with the correct person using two identifiers.   I discussed the limitations of evaluation and management by telemedicine. The patient expressed understanding and agreed to proceed.  Patient location: home  Provider location: Tele-Health-home    Review of Systems     Cardiac Risk Factors include: advanced age (>77mn, >>61women);male gender;hypertension;family history of premature cardiovascular disease     Objective:    Today's Vitals   There is no height or weight on file to calculate BMI.     12/05/2021    1:03 PM 03/19/2021   10:14 AM 03/15/2021    5:49 PM 03/12/2021    7:58 AM 03/12/2021    2:36 AM 02/06/2021    1:46 PM 12/03/2020    3:19 PM  Advanced Directives  Does Patient Have a Medical Advance Directive? No No No  Yes No No  Type of Advance Directive  Healthcare Power of [place]    Does patient want to make changes to medical advance directive?    No - Patient declined     [place]?    No - copy requested     Would patient like information on creating a medical advance directive? No - Patient declined     No - Patient declined     Current Medications (verified) Outpatient Encounter Medications as of 12/05/2021  Medication Sig   aspirin 81 MG tablet Take 81 mg by mouth every evening.    atorvastatin (LIPITOR) 80 MG tablet Take 1 tablet by mouth daily at 12 noon.   clopidogrel (PLAVIX) 75 MG tablet    levothyroxine (SYNTHROID) 75 MCG tablet Take 1 tablet (75 mcg total) by mouth daily.   valsartan (DIOVAN) 80 MG tablet Take 40 mg by mouth at bedtime.   amiodarone (PACERONE) 200 MG tablet Take 0.5 tablets (100 mg total) by mouth daily.   No facility-administered  encounter medications on file as of 12/05/2021.    Allergies (verified) Patient has no known allergies.   History: Past Medical History:  Diagnosis Date   Anxiety    Chronic kidney disease    Coronary artery disease    Dysrhythmia    Heart [place]    Hyperlipidemia    Hypertension    Hypothyroidism    Insomnia    Thyroid disease    Past Surgical History:  Procedure Laterality Date   BACK SURGERY     x2   CARDIAC CATHETERIZATION     CARPAL TUNNEL RELEASE Right 03/08/2018   Procedure: CARPAL TUNNEL RELEASE;  Surgeon: MEarnestine Leys MD;  Location: [facility] ORS;  Service: Orthopedics;  Laterality: Right;   COLONOSCOPY WITH PROPOFOL N/A 07/19/2015   Procedure: COLONOSCOPY WITH PROPOFOL;  Surgeon: RManya Silvas MD;  [facility];  Service: Endoscopy;  Laterality: N/A;   CORONARY ANGIOPLASTY     CORONARY STENT PLACEMENT     Family History  Problem Relation Age of Onset   Hypertension Mother    Heart disease Mother        heart attack   Hypertension Sister    Emphysema Father    Hypertension Daughter    Hypertension Son    Hypertension Sister    Hypertension Sister  Emphysema Sister    Social History   Socioeconomic History   Marital status: Widowed    Spouse name: Not on file   Number of children: Not on file   Years of education: 12   Highest education level: 12th grade  Occupational History   Occupation: retired   Tobacco Use   Smoking status: Former    Types: Cigarettes    Quit date: 08/08/1970    Years since quitting: 51.3   Smokeless tobacco: Never  Vaping Use   Vaping Use: Never used  Substance and Sexual Activity   Alcohol use: Yes    Alcohol/week: 14.0 standard drinks of alcohol    Types: 14 Glasses of wine per week    Comment: 6 oz glasses/ red wine    Drug use: No   Sexual activity: Not Currently  Other Topics Concern   Not on file  Social History Narrative   Not on file   Social Determinants of Health   Financial  Resource Strain: Low Risk  (12/05/2021)   Overall Financial Resource Strain (CARDIA)    Difficulty of Paying Living Expenses: Not hard at all  Food Insecurity: No Food Insecurity (12/05/2021)   Hunger Vital Sign    Worried About Running Out of Food in the Last Year: Never true    Ran Out of Food in the Last Year: Never true  Transportation Needs: Not on file  Physical Activity: Sufficiently Active (12/05/2021)   Exercise Vital Sign    Days of Exercise per Week: 4 days    Minutes of Exercise per Session: 60 min  Stress: No Stress Concern Present (12/05/2021)   [place]    Feeling of Stress : Not at all  Social Connections: Moderately Integrated (12/05/2021)   Social Connection and Isolation Panel [NHANES]    Frequency of Communication with Friends and Family: More than three times a week    Frequency of Social Gatherings with Friends and Family: More than three times a week    Attends Religious Services: More than 4 times per year    Active Member of [employer] or Organizations: Yes    Attends Archivist Meetings: More than 4 times per year    Marital Status: Widowed    Tobacco Counseling Counseling given: Not Answered   Clinical Intake:  Pre-visit preparation completed: Yes  Pain : No/denies pain     Nutritional Risks: None Diabetes: No  How often do you need to have someone help you when you read instructions, pamphlets, or other written materials from your doctor or pharmacy?: 1 - Never  Diabetic?  no  Interpreter Needed?: No  Information entered by :: Leroy Kennedy LPN   Activities of Daily Living    12/05/2021    1:11 PM 03/12/2021    8:21 AM  In your present state of health, do you have any difficulty performing the following activities:  Hearing? 0   Vision? 0   Difficulty concentrating or making decisions? 0   Walking or climbing stairs? 0   Dressing or bathing? 0   Doing errands, shopping? 0  0  Preparing Food and eating ? N   Using the Toilet? N   In the past six months, have you accidently leaked urine? N   Do you have problems with loss of bowel control? N   Managing your Medications? N   Managing your Finances? N   Housekeeping or managing your Housekeeping? N  Patient Care Team: Venita Lick, NP as PCP - General (Nurse Practitioner) Anthonette Legato, MD (Nephrology) Cammie Sickle, MD as Consulting Physician (Internal Medicine)  Indicate any recent Medical Services you may have received from other than [facility] providers in the past year (date may be approximate).     Assessment:   This is a routine wellness examination for Jarmaine.  Hearing/Vision screen Hearing Screening - Comments:: No trouble hearing Vision Screening - Comments:: Up to date [facility]  Dietary issues and exercise activities discussed: Current Exercise Habits: Home exercise routine, Type of exercise: walking, Time (Minutes): 50 (plays golf 4 times a week), Frequency (Times/Week): 4, Weekly Exercise (Minutes/Week): 200, Intensity: Moderate, Exercise limited by: None identified   Goals Addressed             This Visit's Progress    Patient Stated       Stay active and healthy       Depression Screen    12/05/2021    1:10 PM 08/22/2021    8:42 AM 02/14/2021    8:59 AM 12/25/2020    1:14 PM 12/20/2020   11:26 AM 12/03/2020    3:20 PM 08/09/2020   10:16 AM  PHQ 2/9 Scores  PHQ - 2 Score 0 0 0 0 0 0 0  PHQ- 9 Score  0         Fall Risk    12/05/2021    1:08 PM 02/14/2021    9:34 AM 12/25/2020    1:14 PM 12/03/2020    3:20 PM 08/09/2020   10:16 AM  [place] in the past year? 0 0 0 0 0  Number falls in past yr: 0 0 0    Injury with Fall? 0 0 0    Risk for fall due to :  No Fall Risks No Fall Risks Medication side effect   Follow up Falls evaluation completed;Education provided;Falls prevention discussed Follow up appointment Falls evaluation completed Falls evaluation  completed;Education provided;Falls prevention discussed     FALL RISK PREVENTION PERTAINING TO THE HOME:  Any stairs in or around the home? Yes  If so, are there any without handrails? No  Home free of loose throw rugs in walkways, pet beds, electrical cords, etc? Yes  Adequate lighting in your home to reduce risk of falls? Yes   ASSISTIVE DEVICES UTILIZED TO PREVENT FALLS:  Life alert? No  Use of a cane, walker or w/c? No  Grab bars in the bathroom? Yes  Shower chair or bench in shower? No  Elevated toilet seat or a handicapped toilet? Yes   TIMED UP AND GO:  Was the test performed? No .    Cognitive Function:        12/05/2021    1:04 PM 12/03/2020    3:22 PM 05/28/2017    3:56 PM  6CIT Screen  What Year? 0 points 0 points 0 points  What month? 0 points 0 points 0 points  What time? 0 points 0 points 0 points  Count back from 20 0 points 0 points 0 points  Months in reverse 0 points 4 points 0 points  Repeat phrase 0 points 0 points 2 points  Total Score 0 points 4 points 2 points    Immunizations Immunization History  Administered Date(s) Administered   Fluad Quad(high Dose 65+) 05/10/2021   Influenza, High Dose Seasonal PF 05/14/2018, 04/19/2019   Influenza,inj,Quad PF,6+ Mos 04/24/2017   Influenza-Unspecified 03/18/2016, 04/24/2017, 06/19/2020  PFIZER(Purple Top)SARS-COV-2 Vaccination 08/08/2019, 08/29/2019, 06/19/2020   Pneumococcal Conjugate-13 04/10/2014   Pneumococcal Polysaccharide-23 01/02/2008   Td 12/20/2009   Tdap 08/09/2020   Zoster, Live 01/02/2008    TDAP status: Up to date  Flu Vaccine status: Up to date  Pneumococcal vaccine status: Up to date  Covid-19 vaccine status: Information provided on how to obtain vaccines.   Qualifies for Shingles Vaccine? Yes   Zostavax completed Yes   Shingrix Completed?: No.    Education has been provided regarding the importance of this vaccine. Patient has been advised to call insurance company to  determine out of pocket expense if they have not yet received this vaccine. Advised may also receive vaccine at local pharmacy or Health Dept. Verbalized acceptance and understanding.  Screening Tests Health Maintenance  Topic Date Due   COVID-19 Vaccine (4 - Pfizer series) 12/21/2021 (Originally 08/14/2020)   Zoster Vaccines- Shingrix (1 of 2) 03/07/2022 (Originally 06/18/1991)   COLONOSCOPY (Pts 45-48yr Insurance coverage will need to be confirmed)  08/22/2022 (Originally 07/18/2020)   INFLUENZA VACCINE  01/28/2022   TETANUS/TDAP  08/09/2030   Pneumonia Vaccine 81 Years old  Completed   HPV VACCINES  Aged Out    Health Maintenance  There are no preventive care reminders to display for this patient.   Colorectal cancer screening: Type of screening: Colonoscopy. Completed 2017. Repeat every 5 years  Lung Cancer Screening: (Low Dose CT Chest recommended if Age 81-80years, 30 pack-year currently smoking OR have quit w/in 15years.) does not qualify.   Lung Cancer Screening Referral:   Additional Screening:  Hepatitis C Screening: does not qualify; Completed 2022  Vision Screening: Recommended annual ophthalmology exams for early detection of glaucoma and other disorders of the eye. Is the patient up to date with their annual eye exam?  Yes  Who is the provider or what is the name of the office in which the patient attends annual eye exams? [facility] If pt is not established with a provider, would they like to be referred to a provider to establish care? No .   Dental Screening: Recommended annual dental exams for proper oral hygiene  Community Resource Referral / Chronic Care Management: CRR required this visit?  No   CCM required this visit?  No      Plan:     I have personally reviewed and noted the following in the patient's chart:   Medical and social history Use of alcohol, tobacco or illicit drugs  Current medications and supplements including opioid  prescriptions. Patient is not currently taking opioid prescriptions. Functional ability and status Nutritional status Physical activity Advanced directives List of other physicians Hospitalizations, surgeries, and ER visits in previous 12 months Vitals Screenings to include cognitive, depression, and falls Referrals and appointments  In addition, I have reviewed and discussed with patient certain preventive protocols, quality metrics, and best practice recommendations. A written personalized care plan for preventive services as well as general preventive health recommendations were provided to patient.     JLeroy Kennedy LPN   [id number]  Nurse Notes:

## 2021-12-05 NOTE — Patient Instructions (Signed)
Roger Byrd , Thank you for taking time to come for your Medicare Wellness Visit. I appreciate your ongoing commitment to your health goals. Please review the following plan we discussed and let me know if I can assist you in the future.   Screening recommendations/referrals: Colonoscopy: up to date Recommended yearly ophthalmology/optometry visit for glaucoma screening and checkup Recommended yearly dental visit for hygiene and checkup  Vaccinations: Influenza vaccine: up to date Pneumococcal vaccine: up to date Tdap vaccine: up to date Shingles vaccine: Education provided    Advanced directives: Education provided  Conditions/risks identified:   Next appointment: 02-20-2022 @ 8:40  [place] 65 Years and Older, Male Preventive care refers to lifestyle choices and visits with your health care provider that can promote health and wellness. What does preventive care include? A yearly physical exam. This is also called an annual well check. Dental exams once or twice a year. Routine eye exams. Ask your health care provider how often you should have your eyes checked. Personal lifestyle choices, including: Daily care of your teeth and gums. Regular physical activity. Eating a healthy diet. Avoiding tobacco and drug use. Limiting alcohol use. Practicing safe sex. Taking low doses of aspirin every day. Taking vitamin and mineral supplements as recommended by your health care provider. What happens during an annual well check? The services and screenings done by your health care provider during your annual well check will depend on your age, overall health, lifestyle risk factors, and family history of disease. Counseling  Your health care provider may ask you questions about your: Alcohol use. Tobacco use. Drug use. Emotional well-being. Home and relationship well-being. Sexual activity. Eating habits. History of falls. Memory and ability to understand  (cognition). Work and work Statistician. Screening  You may have the following tests or measurements: Height, weight, and BMI. Blood pressure. Lipid and cholesterol levels. These may be checked every 5 years, or more frequently if you are over 18 years old. Skin check. Lung cancer screening. You may have this screening every year starting at age 25 if you have a 30-pack-year history of smoking and currently smoke or have quit within the past 15 years. Fecal occult blood test (FOBT) of the stool. You may have this test every year starting at age 75. Flexible sigmoidoscopy or colonoscopy. You may have a sigmoidoscopy every 5 years or a colonoscopy every 10 years starting at age 65. Prostate cancer screening. Recommendations will vary depending on your family history and other risks. Hepatitis C blood test. Hepatitis B blood test. Sexually transmitted disease (STD) testing. Diabetes screening. This is done by checking your blood sugar (glucose) after you have not eaten for a while (fasting). You may have this done every 1-3 years. Abdominal aortic aneurysm (AAA) screening. You may need this if you are a current or former smoker. Osteoporosis. You may be screened starting at age 29 if you are at high risk. Talk with your health care provider about your test results, treatment options, and if necessary, the need for more tests. Vaccines  Your health care provider may recommend certain vaccines, such as: Influenza vaccine. This is recommended every year. Tetanus, diphtheria, and acellular pertussis (Tdap, Td) vaccine. You may need a Td booster every 10 years. Zoster vaccine. You may need this after age 93. Pneumococcal 13-valent conjugate (PCV13) vaccine. One dose is recommended after age 46. Pneumococcal polysaccharide (PPSV23) vaccine. One dose is recommended after age 72. Talk to your health care provider about which screenings and  vaccines you need and how often you need them. This  information is not intended to replace advice given to you by your health care provider. Make sure you discuss any questions you have with your health care provider. Document Released: 07/13/2015 Document Revised: 03/05/2016 Document Reviewed: 04/17/2015 Elsevier Interactive Patient Education  2017 [employer] Prevention in the Home Falls can cause injuries. They can happen to people of all ages. There are many things you can do to make your home safe and to help prevent falls. What can I do on the outside of my home? Regularly fix the edges of walkways and driveways and fix any cracks. Remove anything that might make you trip as you walk through a door, such as a raised step or threshold. Trim any bushes or trees on the path to your home. Use bright outdoor lighting. Clear any walking paths of anything that might make someone trip, such as rocks or tools. Regularly check to see if handrails are loose or broken. Make sure that both sides of any steps have handrails. Any raised decks and porches should have guardrails on the edges. Have any leaves, snow, or ice cleared regularly. Use sand or salt on walking paths during winter. Clean up any spills in your garage right away. This includes oil or grease spills. What can I do in the bathroom? Use night lights. Install grab bars by the toilet and in the tub and shower. Do not use towel bars as grab bars. Use non-skid mats or decals in the tub or shower. If you need to sit down in the shower, use a plastic, non-slip stool. Keep the floor dry. Clean up any water that spills on the floor as soon as it happens. Remove soap buildup in the tub or shower regularly. Attach bath mats securely with double-sided non-slip rug tape. Do not have throw rugs and other things on the floor that can make you trip. What can I do in the bedroom? Use night lights. Make sure that you have a light by your bed that is easy to reach. Do not use any sheets or  blankets that are too big for your bed. They should not hang down onto the floor. Have a firm chair that has side arms. You can use this for support while you get dressed. Do not have throw rugs and other things on the floor that can make you trip. What can I do in the kitchen? Clean up any spills right away. Avoid walking on wet floors. Keep items that you use a lot in easy-to-reach places. If you need to reach something above you, use a strong step stool that has a grab bar. Keep electrical cords out of the way. Do not use floor polish or wax that makes floors slippery. If you must use wax, use non-skid floor wax. Do not have throw rugs and other things on the floor that can make you trip. What can I do with my stairs? Do not leave any items on the stairs. Make sure that there are handrails on both sides of the stairs and use them. Fix handrails that are broken or loose. Make sure that handrails are as long as the stairways. Check any carpeting to make sure that it is firmly attached to the stairs. Fix any carpet that is loose or worn. Avoid having throw rugs at the top or bottom of the stairs. If you do have throw rugs, attach them to the floor with carpet tape. Make  sure that you have a light switch at the top of the stairs and the bottom of the stairs. If you do not have them, ask someone to add them for you. What else can I do to help prevent falls? Wear shoes that: Do not have high heels. Have rubber bottoms. Are comfortable and fit you well. Are closed at the toe. Do not wear sandals. If you use a stepladder: Make sure that it is fully opened. Do not climb a closed stepladder. Make sure that both sides of the stepladder are locked into place. Ask someone to hold it for you, if possible. Clearly mark and make sure that you can see: Any grab bars or handrails. First and last steps. Where the edge of each step is. Use tools that help you move around (mobility aids) if they are  needed. These include: Canes. Walkers. Scooters. Crutches. Turn on the lights when you go into a dark area. Replace any light bulbs as soon as they burn out. Set up your furniture so you have a clear path. Avoid moving your furniture around. If any of your floors are uneven, fix them. If there are any pets around you, be aware of where they are. Review your medicines with your doctor. Some medicines can make you feel dizzy. This can increase your chance of falling. Ask your doctor what other things that you can do to help prevent falls. This information is not intended to replace advice given to you by your health care provider. Make sure you discuss any questions you have with your health care provider. Document Released: 04/12/2009 Document Revised: 11/22/2015 Document Reviewed: 07/21/2014 Elsevier Interactive Patient Education  2017 [employer].

## 2021-12-06 ENCOUNTER — Ambulatory Visit: Payer: Medicare Other

## 2021-12-12 DIAGNOSIS — E782 Mixed hyperlipidemia: Secondary | ICD-10-CM | POA: Diagnosis not present

## 2021-12-12 DIAGNOSIS — I2510 Atherosclerotic heart disease of native coronary artery without angina pectoris: Secondary | ICD-10-CM | POA: Diagnosis not present

## 2021-12-12 DIAGNOSIS — I10 Essential (primary) hypertension: Secondary | ICD-10-CM | POA: Diagnosis not present

## 2021-12-19 DIAGNOSIS — N1831 Chronic kidney disease, stage 3a: Secondary | ICD-10-CM | POA: Diagnosis not present

## 2022-01-02 DIAGNOSIS — N1831 Chronic kidney disease, stage 3a: Secondary | ICD-10-CM | POA: Diagnosis not present

## 2022-01-02 DIAGNOSIS — D631 Anemia in chronic kidney disease: Secondary | ICD-10-CM | POA: Diagnosis not present

## 2022-01-02 DIAGNOSIS — R809 Proteinuria, unspecified: Secondary | ICD-10-CM | POA: Diagnosis not present

## 2022-01-02 DIAGNOSIS — I10 Essential (primary) hypertension: Secondary | ICD-10-CM | POA: Diagnosis not present

## 2022-01-09 DIAGNOSIS — I208 Other forms of angina pectoris: Secondary | ICD-10-CM | POA: Diagnosis not present

## 2022-01-09 DIAGNOSIS — I2119 ST elevation (STEMI) myocardial infarction involving other coronary artery of inferior wall: Secondary | ICD-10-CM | POA: Diagnosis not present

## 2022-01-09 DIAGNOSIS — I10 Essential (primary) hypertension: Secondary | ICD-10-CM | POA: Diagnosis not present

## 2022-01-09 DIAGNOSIS — E782 Mixed hyperlipidemia: Secondary | ICD-10-CM | POA: Diagnosis not present

## 2022-01-09 DIAGNOSIS — I2510 Atherosclerotic heart disease of native coronary artery without angina pectoris: Secondary | ICD-10-CM | POA: Diagnosis not present

## 2022-01-09 DIAGNOSIS — R0602 Shortness of breath: Secondary | ICD-10-CM | POA: Diagnosis not present

## 2022-01-30 DIAGNOSIS — I2510 Atherosclerotic heart disease of native coronary artery without angina pectoris: Secondary | ICD-10-CM | POA: Diagnosis not present

## 2022-01-30 DIAGNOSIS — I2119 ST elevation (STEMI) myocardial infarction involving other coronary artery of inferior wall: Secondary | ICD-10-CM | POA: Diagnosis not present

## 2022-01-30 DIAGNOSIS — I208 Other forms of angina pectoris: Secondary | ICD-10-CM | POA: Diagnosis not present

## 2022-02-11 DIAGNOSIS — I2510 Atherosclerotic heart disease of native coronary artery without angina pectoris: Secondary | ICD-10-CM | POA: Diagnosis not present

## 2022-02-11 DIAGNOSIS — E782 Mixed hyperlipidemia: Secondary | ICD-10-CM | POA: Diagnosis not present

## 2022-02-11 DIAGNOSIS — I10 Essential (primary) hypertension: Secondary | ICD-10-CM | POA: Diagnosis not present

## 2022-02-11 DIAGNOSIS — I2119 ST elevation (STEMI) myocardial infarction involving other coronary artery of inferior wall: Secondary | ICD-10-CM | POA: Diagnosis not present

## 2022-02-11 DIAGNOSIS — I252 Old myocardial infarction: Secondary | ICD-10-CM | POA: Diagnosis not present

## 2022-02-16 NOTE — Patient Instructions (Signed)

## 2022-02-20 ENCOUNTER — Encounter: Payer: Self-pay | Admitting: Nurse Practitioner

## 2022-02-20 ENCOUNTER — Ambulatory Visit (INDEPENDENT_AMBULATORY_CARE_PROVIDER_SITE_OTHER): Payer: Medicare Other | Admitting: Nurse Practitioner

## 2022-02-20 VITALS — BP 128/68 | HR 53 | Temp 97.7°F | Ht 67.25 in | Wt 150.8 lb

## 2022-02-20 DIAGNOSIS — I471 Supraventricular tachycardia: Secondary | ICD-10-CM

## 2022-02-20 DIAGNOSIS — I10 Essential (primary) hypertension: Secondary | ICD-10-CM | POA: Diagnosis not present

## 2022-02-20 DIAGNOSIS — Z0000 Encounter for general adult medical examination without abnormal findings: Secondary | ICD-10-CM | POA: Diagnosis not present

## 2022-02-20 DIAGNOSIS — N1831 Chronic kidney disease, stage 3a: Secondary | ICD-10-CM

## 2022-02-20 DIAGNOSIS — Z23 Encounter for immunization: Secondary | ICD-10-CM | POA: Diagnosis not present

## 2022-02-20 DIAGNOSIS — I2510 Atherosclerotic heart disease of native coronary artery without angina pectoris: Secondary | ICD-10-CM

## 2022-02-20 DIAGNOSIS — E039 Hypothyroidism, unspecified: Secondary | ICD-10-CM

## 2022-02-20 DIAGNOSIS — I252 Old myocardial infarction: Secondary | ICD-10-CM | POA: Diagnosis not present

## 2022-02-20 DIAGNOSIS — D631 Anemia in chronic kidney disease: Secondary | ICD-10-CM | POA: Diagnosis not present

## 2022-02-20 DIAGNOSIS — R7301 Impaired fasting glucose: Secondary | ICD-10-CM | POA: Diagnosis not present

## 2022-02-20 DIAGNOSIS — E782 Mixed hyperlipidemia: Secondary | ICD-10-CM | POA: Diagnosis not present

## 2022-02-20 MED ORDER — SHINGRIX 50 MCG/0.5ML IM SUSR: 0.5000 mL | Freq: Once | INTRAMUSCULAR | 0 refills | Status: AC

## 2022-02-20 NOTE — Assessment & Plan Note (Signed)
A1c trend down to 5.7% last visit, recommend continue diet focus and plan to recheck A1c today and every 6 months.

## 2022-02-20 NOTE — Progress Notes (Signed)
BP 128/68 (BP Location: Left Arm, Patient Position: Sitting, Cuff Size: Normal)   Pulse (!) 53   Temp 97.7 F (36.5 C) (Oral)   Ht 5' 7.25" (1.708 m)   Wt 150 lb 12.8 oz (68.4 kg)   SpO2 99%   BMI 23.44 kg/m    Subjective:    Patient ID: Roger Byrd, male    DOB: 30-Aug-1940, 81 y.o.   MRN: [id number]  HPI: Roger Byrd is a 81 y.o. male presenting on 02/20/2022 for comprehensive medical examination. Current medical complaints include:none  He currently lives with: son Interim Problems from his last visit: no   HYPERTENSION / HYPERLIPIDEMIA Taking Valsartan, ASA, Brilinta, Atorvastatin.  NSTEMI on 12/25/20 (inferior myocardial infarction), admitted to hospital and left heart cath performed - stent RCA 11/2020.  Saw cardiology, Dr. Nehemiah Massed, last for follow-up on 02/11/22.  Does have issues with occasional hypotension -- 8 times over past [place] -- a full work-up was done with cardiology - no findings.  A1c mild elevation at 5.7% in February 2023. Satisfied with current treatment? yes Duration of hypertension: chronic BP monitoring frequency: daily BP range: 120/72 this morning and HR 58 BP medication side effects: no Duration of hyperlipidemia: chronic Cholesterol medication side effects: no Cholesterol supplements: none Medication compliance: good compliance Aspirin: yes Recent stressors: no Recurrent headaches: no Visual changes: no Palpitations: no Dyspnea: no Chest pain: no Lower extremity edema: no Dizzy/lightheaded: no   CHRONIC KIDNEY DISEASE Followed by nephrology. Last visit 01/02/22.  Sees every 6 months.  Labs 01/02/22 -- CRT 1.42 and GFR 55, PTH 50.  Hematology for anemia with CKD, off iron tablets -- last saw 02/06/21 -- to follow-up as needed only. CKD status: stable Medications renally dose: yes Previous renal evaluation: yes Pneumovax:  Up to Date Influenza Vaccine:  Up to Date  HYPOTHYROIDISM Was on Levothyroxine 75  MCG. Thyroid control status:stable Satisfied with current treatment? yes Medication side effects: no Medication compliance: good compliance Etiology of hypothyroidism:  Recent dose adjustment:no Fatigue: no Cold intolerance: no Heat intolerance: no Weight gain: no Weight loss: no Constipation: no Diarrhea/loose stools: no Palpitations: no Lower extremity edema: no Anxiety/depressed mood: no   Functional Status Survey: Is the patient deaf or have difficulty hearing?: No Does the patient have difficulty seeing, even when wearing glasses/contacts?: No Does the patient have difficulty concentrating, remembering, or making decisions?: No Does the patient have difficulty walking or climbing stairs?: No Does the patient have difficulty dressing or bathing?: No Does the patient have difficulty doing errands alone such as visiting a doctor's office or shopping?: No  FALL RISK:    02/20/2022    8:27 AM 12/05/2021    1:08 PM 02/14/2021    9:34 AM 12/25/2020    1:14 PM 12/03/2020    3:20 PM  [place] in the past year? 0 0 0 0 0  Number falls in past yr: 0 0 0 0   Injury with Fall? 0 0 0 0   Risk for fall due to : No Fall Risks  No Fall Risks No Fall Risks Medication side effect  Follow up Falls evaluation completed Falls evaluation completed;Education provided;Falls prevention discussed Follow up appointment Falls evaluation completed Falls evaluation completed;Education provided;Falls prevention discussed    Depression Screen    02/20/2022    8:28 AM 12/05/2021    1:10 PM 08/22/2021    8:42 AM 02/14/2021    8:59 AM 12/25/2020  1:14 PM  Depression screen PHQ 2/9  Decreased Interest 0 0 0 0 0  Down, Depressed, Hopeless 0 0 0 0 0  PHQ - 2 Score 0 0 0 0 0  Altered sleeping 0  0    Tired, decreased energy 0  0    Change in appetite 0  0    Feeling bad or failure about yourself  0  0    Trouble concentrating 0  0    Moving slowly or fidgety/restless 0  0    Suicidal thoughts  0  0    PHQ-9 Score 0  0    Difficult doing work/chores Not difficult at all  Not difficult at all      Advanced Directives <no information>  Past Medical History:  Past Medical History:  Diagnosis Date   Anxiety    Chronic kidney disease    Coronary artery disease    Dysrhythmia    Heart attack ([place])    Hyperlipidemia    Hypertension    Hypothyroidism    Insomnia    Thyroid disease     Surgical History:  Past Surgical History:  Procedure Laterality Date   BACK SURGERY     x2   CARDIAC CATHETERIZATION     CARPAL TUNNEL RELEASE Right 03/08/2018   Procedure: CARPAL TUNNEL RELEASE;  Surgeon: Earnestine Leys, MD;  Location: [facility] ORS;  Service: Orthopedics;  Laterality: Right;   COLONOSCOPY WITH PROPOFOL N/A 07/19/2015   Procedure: COLONOSCOPY WITH PROPOFOL;  Surgeon: Manya Silvas, MD;  Location: [facility];  Service: Endoscopy;  Laterality: N/A;   CORONARY ANGIOPLASTY     CORONARY STENT PLACEMENT      Medications:  Current Outpatient Medications on File Prior to Visit  Medication Sig   aspirin 81 MG tablet Take 81 mg by mouth every evening.    atorvastatin (LIPITOR) 80 MG tablet Take 1 tablet by mouth daily at 12 noon.   clopidogrel (PLAVIX) 75 MG tablet    levothyroxine (SYNTHROID) 75 MCG tablet Take 1 tablet (75 mcg total) by mouth daily.   valsartan (DIOVAN) 80 MG tablet Take 40 mg by mouth at bedtime.   No current facility-administered medications on file prior to visit.    Allergies:  No Known Allergies  Social History:  Social History   Socioeconomic History   Marital status: Widowed    Spouse name: Not on file   Number of children: Not on file   Years of education: 12   Highest education level: 12th grade  Occupational History   Occupation: retired   Tobacco Use   Smoking status: Former    Types: Cigarettes    Quit date: 08/08/1970    Years since quitting: 51.5   Smokeless tobacco: Never  Vaping Use   Vaping Use: Never used  Substance and  Sexual Activity   Alcohol use: Yes    Alcohol/week: 14.0 standard drinks of alcohol    Types: 14 Glasses of wine per week    Comment: 6 oz glasses/ red wine    Drug use: No   Sexual activity: Not Currently  Other Topics Concern   Not on file  Social History Narrative   Not on file   Social Determinants of Health   Financial Resource Strain: Low Risk  (12/05/2021)   Overall Financial Resource Strain (CARDIA)    Difficulty of Paying Living Expenses: Not hard at all  Food Insecurity: No Food Insecurity (12/05/2021)   Hunger Vital Sign    Worried  About Running Out of Food in the Last Year: Never true    Ran Out of Food in the Last Year: Never true  Transportation Needs: No Transportation Needs (02/20/2022)   PRAPARE - Hydrologist (Medical): No    Lack of Transportation (Non-Medical): No  Physical Activity: Sufficiently Active (12/05/2021)   Exercise Vital Sign    Days of Exercise per Week: 4 days    Minutes of Exercise per Session: 60 min  Stress: No Stress Concern Present (12/05/2021)   Heath    Feeling of Stress : Not at all  Social Connections: Moderately Integrated (12/05/2021)   Social Connection and Isolation Panel [NHANES]    Frequency of Communication with Friends and Family: More than three times a week    Frequency of Social Gatherings with Friends and Family: More than three times a week    Attends Religious Services: More than 4 times per year    Active Member of [employer] or Organizations: Yes    Attends Archivist Meetings: More than 4 times per year    Marital Status: Widowed  Intimate Partner Violence: Not At Risk (02/14/2021)   Humiliation, Afraid, Rape, and Kick questionnaire    Fear of Current or Ex-Partner: No    Emotionally Abused: No    Physically Abused: No    Sexually Abused: No   Social History   Tobacco Use  Smoking Status Former   Types: Cigarettes    Quit date: 08/08/1970   Years since quitting: 51.5  Smokeless Tobacco Never   Social History   Substance and Sexual Activity  Alcohol Use Yes   Alcohol/week: 14.0 standard drinks of alcohol   Types: 14 Glasses of wine per week   Comment: 6 oz glasses/ red wine     Family History:  Family History  Problem Relation Age of Onset   Hypertension Mother    Heart disease Mother        heart attack   Hypertension Sister    Emphysema Father    Hypertension Daughter    Hypertension Son    Hypertension Sister    Hypertension Sister    Emphysema Sister     Past medical history, surgical history, medications, allergies, family history and social history reviewed with patient today and changes made to appropriate areas of the chart.   Review of Systems - negative All other ROS negative except what is listed above and in the HPI.      Objective:    BP 128/68 (BP Location: Left Arm, Patient Position: Sitting, Cuff Size: Normal)   Pulse (!) 53   Temp 97.7 F (36.5 C) (Oral)   Ht 5' 7.25" (1.708 m)   Wt 150 lb 12.8 oz (68.4 kg)   SpO2 99%   BMI 23.44 kg/m   Wt Readings from Last 3 Encounters:  02/20/22 150 lb 12.8 oz (68.4 kg)  08/22/21 150 lb (68 kg)  03/15/21 145 lb 11.6 oz (66.1 kg)    Physical Exam Vitals and nursing note reviewed.  Constitutional:      General: He is awake. He is not in acute distress.    Appearance: He is well-developed and well-groomed. He is not ill-appearing.  HENT:     Head: Normocephalic and atraumatic.     Right Ear: Hearing, tympanic membrane, ear canal and external ear normal. No drainage.     Left Ear: Hearing, tympanic membrane, ear canal and external  ear normal. No drainage.     Nose: Nose normal.     Mouth/Throat:     Pharynx: Uvula midline.  Eyes:     General: Lids are normal.        Right eye: No discharge.        Left eye: No discharge.     Extraocular Movements: Extraocular movements intact.     Conjunctiva/sclera: Conjunctivae  normal.     Pupils: Pupils are equal, round, and reactive to light.     Visual Fields: Right eye visual fields normal and left eye visual fields normal.  Neck:     Thyroid: No thyromegaly.     Vascular: No carotid bruit or JVD.     Trachea: Trachea normal.  Cardiovascular:     Rate and Rhythm: Normal rate and regular rhythm.     Heart sounds: Normal heart sounds, S1 normal and S2 normal. No murmur heard.    No gallop.  Pulmonary:     Effort: Pulmonary effort is normal. No accessory muscle usage or respiratory distress.     Breath sounds: Normal breath sounds.  Abdominal:     General: Bowel sounds are normal.     Palpations: Abdomen is soft. There is no hepatomegaly or splenomegaly.     Tenderness: There is no abdominal tenderness.  Genitourinary:    Comments: Deferred per patient request Musculoskeletal:        General: Normal range of motion.     Cervical back: Normal range of motion and neck supple.     Right lower leg: No edema.     Left lower leg: No edema.  Lymphadenopathy:     Head:     Right side of head: No submental, submandibular, tonsillar, preauricular or posterior auricular adenopathy.     Left side of head: No submental, submandibular, tonsillar, preauricular or posterior auricular adenopathy.     Cervical: No cervical adenopathy.  Skin:    General: Skin is warm and dry.     Capillary Refill: Capillary refill takes less than 2 seconds.     Findings: No rash.  Neurological:     Mental Status: He is alert and oriented to person, place, and time.     Gait: Gait is intact.     Deep Tendon Reflexes: Reflexes are normal and symmetric.     Reflex Scores:      Brachioradialis reflexes are 2+ on the right side and 2+ on the left side.      Patellar reflexes are 2+ on the right side and 2+ on the left side. Psychiatric:        Attention and Perception: Attention normal.        Mood and Affect: Mood normal.        Speech: Speech normal.        Behavior: Behavior  normal. Behavior is cooperative.        Thought Content: Thought content normal.        Cognition and Memory: Cognition normal.        Judgment: Judgment normal.    Results for orders placed or performed in visit on 11/28/21  TSH  Result Value Ref Range   TSH 2.320 0.450 - 4.500 uIU/mL  T4, free  Result Value Ref Range   Free T4 1.56 0.82 - 1.77 ng/dL      Assessment & Plan:   Problem List Items Addressed This Visit       Cardiovascular and Mediastinum   Coronary artery disease  involving native coronary artery of native heart    NSTEMI, (inferior myocardial infarction), on 12/25/20 with heart cath.  At this time continue current medication regimen and collaboration with cardiology.  Lipid panel today.      Relevant Orders   Lipid Panel w/o Chol/HDL Ratio   Hypertension    Chronic, ongoing with initial BP elevated, but repeat at goal and home readings consistently at goal + occasional low readings reported.  Continue current medication regimen and adjust as needed. Recommend continue to monitor BP at home regularly and document for provider + focus on DASH diet.  Labs up to date with nephrology in July 2023.  Return in 6 months, continue to collaborate with cardiology.      Relevant Orders   Lipid Panel w/o Chol/HDL Ratio   SVT (supraventricular tachycardia) (HCC)    Chronic, stable at this time with no symptoms.  Continue collaboration with cardiology.  Recent notes reviewed.      Relevant Orders   Lipid Panel w/o Chol/HDL Ratio     Endocrine   Hypothyroidism    Chronic, ongoing.  At this time continue current Levothyroxine dosing and adjust as needed based on labs.  Amiodarone no longer on board.      Relevant Orders   TSH   T4, free   Thyroid peroxidase antibody   IFG (impaired fasting glucose)    A1c trend down to 5.7% last visit, recommend continue diet focus and plan to recheck A1c today and every 6 months.      Relevant Orders   HgB A1c     Genitourinary    Anemia due to stage 3a chronic kidney disease ([place])    Chronic, stable at this time with improved labs in August 2022.  Continue collaboration with hematology as needed only and nephrology as scheduled + current medication regimen.        CKD (chronic kidney disease) - Primary    Chronic, stable, followed by nephrology.  Continue this collaboration and current medication regimen, ARB on board for kidney protection.  Labs up to date with nephrology.  Urine ALB 80 on check (February 2023).  Return in 6 months.        Other   History of non-ST elevation myocardial infarction (NSTEMI)    On 12/25/20, (inferior myocardial infarction).  Continue collaboration with cardiology and current medication regimen as prescribed by them.  Lipid panel today.      Hyperlipidemia    Chronic, ongoing.  Continue current medication regimen and adjust as needed.  Lipid panel today.      Relevant Orders   Lipid Panel w/o Chol/HDL Ratio   Other Visit Diagnoses     Need for shingles vaccine       Shingrix ordered today.   Relevant Medications   Zoster Vaccine Adjuvanted [facility]) injection   Zoster Vaccine Adjuvanted [facility]) injection (Start on 04/17/2022)   Encounter for annual physical exam           Discussed aspirin prophylaxis for myocardial infarction prevention and decision was made to continue ASA  LABORATORY TESTING:  Health maintenance labs ordered today as discussed above.   The natural history of prostate cancer and ongoing controversy regarding screening and potential treatment outcomes of prostate cancer has been discussed with the patient. The meaning of a false positive PSA and a false negative PSA has been discussed. He indicates understanding of the limitations of this screening test and wishes not to proceed with screening PSA testing.  IMMUNIZATIONS:   - Tdap: Tetanus vaccination status reviewed: Up To Date - Influenza: Up to date - Pneumovax: Up to date - Prevnar: Up to  date - Zostavax vaccine: Ordered today  SCREENING: - Colonoscopy: Refuses Discussed with patient purpose of the colonoscopy is to detect colon cancer at curable precancerous or early stages   - AAA Screening: Not applicable  -Hearing Test: Not applicable  -Spirometry: Not applicable   PATIENT COUNSELING:    Sexuality: Discussed sexually transmitted diseases, partner selection, use of condoms, avoidance of unintended pregnancy  and contraceptive alternatives.   Advised to avoid cigarette smoking.  I discussed with the patient that most people either abstain from alcohol or drink within safe limits (<=14/week and <=4 drinks/occasion for males, <=7/weeks and <= 3 drinks/occasion for females) and that the risk for alcohol disorders and other health effects rises proportionally with the number of drinks per week and how often a drinker exceeds daily limits.  Discussed cessation/primary prevention of drug use and availability of treatment for abuse.   Diet: Encouraged to adjust caloric intake to maintain  or achieve ideal body weight, to reduce intake of dietary saturated fat and total fat, to limit sodium intake by avoiding high sodium foods and not adding table salt, and to maintain adequate dietary potassium and calcium preferably from fresh fruits, vegetables, and low-fat dairy products.    Stressed the importance of regular exercise  Injury prevention: Discussed safety belts, safety helmets, smoke detector, smoking near bedding or upholstery.   Dental health: Discussed importance of regular tooth brushing, flossing, and dental visits.   Follow up plan: NEXT PREVENTATIVE PHYSICAL DUE IN 1 YEAR. Return in about 6 months (around 08/23/2022) for HTN/HLD, THYROID, A1c Check, CKD.

## 2022-02-20 NOTE — Assessment & Plan Note (Signed)
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  Continue collaboration with cardiology and current medication regimen as prescribed by them.  Lipid panel today. 

## 2022-02-20 NOTE — Assessment & Plan Note (Signed)
Chronic, stable, followed by nephrology.  Continue this collaboration and current medication regimen, ARB on board for kidney protection.  Labs up to date with nephrology.  Urine ALB 80 on check (February 2023).  Return in 6 months.

## 2022-02-20 NOTE — Assessment & Plan Note (Signed)
NSTEMI, (inferior myocardial infarction), on 12/25/20 with heart cath.  At this time continue current medication regimen and collaboration with cardiology.  Lipid panel today.

## 2022-02-20 NOTE — Assessment & Plan Note (Signed)
Chronic, ongoing.  Continue current medication regimen and adjust as needed. Lipid panel today. 

## 2022-02-20 NOTE — Assessment & Plan Note (Signed)
Chronic, ongoing.  At this time continue current Levothyroxine dosing and adjust as needed based on labs.  Amiodarone no longer on board.

## 2022-02-20 NOTE — Assessment & Plan Note (Signed)
Chronic, stable at this time with improved labs in August 2022.  Continue collaboration with hematology as needed only and nephrology as scheduled + current medication regimen.   

## 2022-02-20 NOTE — Assessment & Plan Note (Signed)
Chronic, stable at this time with no symptoms.  Continue collaboration with cardiology.  Recent notes reviewed.

## 2022-02-20 NOTE — Assessment & Plan Note (Signed)
Chronic, ongoing with initial BP elevated, but repeat at goal and home readings consistently at goal + occasional low readings reported.  Continue current medication regimen and adjust as needed. Recommend continue to monitor BP at home regularly and document for provider + focus on DASH diet.  Labs up to date with nephrology in July 2023.  Return in 6 months, continue to collaborate with cardiology.

## 2022-02-21 LAB — TSH: TSH: 1.66 u[IU]/mL (ref 0.450–4.500)

## 2022-02-21 LAB — LIPID PANEL W/O CHOL/HDL RATIO
Cholesterol, Total: 133 mg/dL (ref 100–199)
HDL: 41 mg/dL (ref >39)
LDL Chol Calc (NIH): 78 mg/dL (ref 0–99)
Triglycerides: 69 mg/dL (ref 0–149)
VLDL Cholesterol Cal: 14 mg/dL (ref 5–40)

## 2022-02-21 LAB — HEMOGLOBIN A1C
Est. average glucose Bld gHb Est-mCnc: 128 mg/dL
Hgb A1c MFr Bld: 6.1 % — ABNORMAL HIGH (ref 4.8–5.6)

## 2022-02-21 LAB — THYROID PEROXIDASE ANTIBODY: Thyroperoxidase Ab SerPl-aCnc: 10 IU/mL (ref 0–34)

## 2022-02-21 LAB — T4, FREE: Free T4: 1.62 ng/dL (ref 0.82–1.77)

## 2022-02-21 NOTE — Progress Notes (Signed)
Contacted via [place] afternoon [place], your labs have returned: - Cholesterol labs remain stable, continue Atorvastatin daily. - Thyroid labs are normal, continue current Levothyroxine dosing. - The A1C is the diabetes testing we talked about, this looks at your blood sugars over the past 3 months and turns the average into a number.  Your number is 6.1%, meaning you are prediabetic.  Any number 5.7 to 6.4 is considered prediabetes and any number 6.5 or greater is considered diabetes.   I would recommend heavy focus on decreasing foods high in sugar and your intake of things like bread products, pasta, and rice.  The American Diabetes Association online has a large amount of information on diet changes to make.  We will recheck this number in 3 months to ensure you are not continuing to trend upwards and move into diabetes.  Have a good day.  Any questions? Keep being stellar!!  Thank you for allowing me to participate in your care.  I appreciate you. Kindest regards, Lamerle Jabs

## 2022-02-25 ENCOUNTER — Telehealth: Payer: Self-pay | Admitting: Nurse Practitioner

## 2022-02-25 ENCOUNTER — Encounter: Payer: Self-pay | Admitting: Nurse Practitioner

## 2022-02-25 ENCOUNTER — Other Ambulatory Visit: Payer: Self-pay | Admitting: Nurse Practitioner

## 2022-02-25 ENCOUNTER — Telehealth (INDEPENDENT_AMBULATORY_CARE_PROVIDER_SITE_OTHER): Payer: Medicare Other | Admitting: Nurse Practitioner

## 2022-02-25 VITALS — BP 138/60 | HR 78

## 2022-02-25 DIAGNOSIS — U071 COVID-19: Secondary | ICD-10-CM | POA: Diagnosis not present

## 2022-02-25 MED ORDER — MOLNUPIRAVIR EUA 200MG CAPSULE: 4.0000 | ORAL_CAPSULE | Freq: Two times a day (BID) | ORAL | 0 refills | Status: AC

## 2022-02-25 MED ORDER — MOLNUPIRAVIR EUA 200MG CAPSULE
4.0000 | ORAL_CAPSULE | Freq: Two times a day (BID) | ORAL | 0 refills | Status: AC
Start: 2022-02-25 — End: 2022-03-02

## 2022-02-25 NOTE — Addendum Note (Signed)
Addended by: Jon Billings on: 02/25/2022 03:47 PM   Modules accepted: Orders

## 2022-02-25 NOTE — Telephone Encounter (Unsigned)
Copied from [place] [phone]. Topic: General - Other >> Feb 25, 2022  2:21 PM Everette C wrote: Reason for CRM: Medication Refill - Medication: molnupiravir EUA (LAGEVRIO) 200 mg CAPS capsule [[id number]]   Has the patient contacted their pharmacy? No. The previously selected pharmacy was out of stock  (Agent: If no, request that the patient contact the pharmacy for the refill. If patient does not wish to contact the pharmacy document the reason why and proceed with request.) (Agent: If yes, when and what did the pharmacy advise?)  Preferred Pharmacy (with phone number or street name): [id number] [place] [id number]: [phone]: [phone]: Not open 24 hours   Has the patient been seen for an appointment in the last year OR does the patient have an upcoming appointment? Yes.    Agent: Please be advised that RX refills may take up to 3 business days. We ask that you follow-up with your pharmacy.

## 2022-02-25 NOTE — Telephone Encounter (Signed)
See previous note, pt request alternate pharmacy.

## 2022-02-25 NOTE — Telephone Encounter (Signed)
[facility] does not carry molnupiravir EUA (LAGEVRIO) 200 mg CAPS capsule / called to see if provider would sent this RX somewhere else to be filled / please advise

## 2022-02-25 NOTE — Telephone Encounter (Signed)
Spoke with patient, would like [facility] [address] [facility].

## 2022-02-25 NOTE — Progress Notes (Signed)
BP 138/60   Pulse 78    Subjective:    Patient ID: Roger Byrd, male    DOB: [birth year], 81 y.o.   MRN: [id number]  HPI: Roger Byrd is a 81 y.o. male  Chief Complaint  Patient presents with   URI    Pt states he started having a fever, chills, congestion, body aches last night. States his son just recently tested positive for Covid.    UPPER RESPIRATORY TRACT INFECTION Worst symptom: COVID Positive- symptoms started last night Fever:  101 Cough: yes Shortness of breath: no Wheezing: no Chest pain: no Chest tightness: no Chest congestion: no Nasal congestion: yes Runny nose: yes Post nasal drip: yes Sneezing: no Sore throat: no Swollen glands: no Sinus pressure: no Headache: no Face pain: no Toothache: no Ear pain: no bilateral Ear pressure: no bilateral Eyes red/itching:no Eye drainage/crusting: no  Vomiting: no Rash: no Fatigue: no Sick contacts: no Strep contacts: no  Context: better Recurrent sinusitis: no Relief with OTC cold/cough medications: no  Treatments attempted: none   Relevant past medical, surgical, family and social history reviewed and updated as indicated. Interim medical history since our last visit reviewed. Allergies and medications reviewed and updated.  Review of Systems  Constitutional:  Positive for fever. Negative for fatigue.  HENT:  Positive for congestion. Negative for ear pain, postnasal drip, rhinorrhea, sinus pressure, sinus pain, sneezing and sore throat.   Respiratory:  Positive for cough. Negative for chest tightness, shortness of breath and wheezing.   Gastrointestinal:  Negative for vomiting.  Skin:  Negative for rash.  Neurological:  Negative for headaches.    Per HPI unless specifically indicated above     Objective:    BP 138/60   Pulse 78   Wt Readings from Last 3 Encounters:  02/20/22 150 lb 12.8 oz (68.4 kg)  08/22/21 150 lb (68 kg)  03/15/21 145 lb 11.6 oz (66.1 kg)    Physical Exam Vitals  and nursing note reviewed.  HENT:     Right Ear: Hearing normal.     Left Ear: Hearing normal.  Pulmonary:     Effort: Pulmonary effort is normal. No respiratory distress.  Neurological:     Mental Status: He is alert.  Psychiatric:        Mood and Affect: Mood normal.        Behavior: Behavior normal.        Thought Content: Thought content normal.        Judgment: Judgment normal.     Results for orders placed or performed in visit on 02/20/22  Lipid Panel w/o Chol/HDL Ratio  Result Value Ref Range   Cholesterol, Total 133 100 - 199 mg/dL   Triglycerides 69 0 - 149 mg/dL   HDL 41 >39 mg/dL   VLDL Cholesterol Cal 14 5 - 40 mg/dL   LDL Chol Calc (NIH) 78 0 - 99 mg/dL  TSH  Result Value Ref Range   TSH 1.660 0.450 - 4.500 uIU/mL  T4, free  Result Value Ref Range   Free T4 1.62 0.82 - 1.77 ng/dL  HgB A1c  Result Value Ref Range   Hgb A1c MFr Bld 6.1 (H) 4.8 - 5.6 %   Est. average glucose Bld gHb Est-mCnc 128 mg/dL  Thyroid peroxidase antibody  Result Value Ref Range   Thyroperoxidase Ab SerPl-aCnc 10 0 - 34 IU/mL      Assessment & Plan:   Problem List Items Addressed This Visit  None Visit Diagnoses     COVID-19    -  Primary   Complete course of molnupiravir. Discussed signs and symptoms to monitor for and when to seek higher level of care. Follow up if symptoms not improved.   Relevant Medications   molnupiravir EUA (LAGEVRIO) 200 mg CAPS capsule        Follow up plan: No follow-ups on file.   This visit was completed via MyChart due to the restrictions of the COVID-19 pandemic. All issues as above were discussed and addressed. Physical exam was done as above through visual confirmation on MyChart. If it was felt that the patient should be evaluated in the office, they were directed there. The patient verbally consented to this visit. Location of the patient: Home Location of the provider: Office Those involved with this call:  Provider: Jon Billings,  NP CMA: Yvonna Alanis, [place] Desk/Registration: Lynnell Catalan This encounter was conducted via phone.  I spent 21 dedicated to the care of this patient on the date of this encounter to include previsit review of symptoms, plan of care and symptoms to monitor for, face to face time with the patient, and post visit ordering of testing.

## 2022-02-26 NOTE — Telephone Encounter (Signed)
Requested medication (s) are due for refill today: No  Requested medication (s) are on the active medication list: Yes  Last refill:  02/25/22  Future visit scheduled: Yes  Notes to clinic:  Duplicate request.    Requested Prescriptions  Pending Prescriptions Disp Refills   molnupiravir EUA (LAGEVRIO) 200 mg CAPS capsule 40 capsule 0    Sig: Take 4 capsules (800 mg total) by mouth 2 (two) times daily for 5 days.     Off-Protocol Failed - 02/25/2022  2:42 PM      Failed - Medication not assigned to a protocol, review manually.      Passed - Valid encounter within last 12 months    Recent Outpatient Visits           Yesterday COVID-19   [facility] Jon Billings, NP   6 days ago Stage 3a chronic kidney disease ([place])   [place] Cannady, Jolene T, NP   6 months ago SVT (supraventricular tachycardia) ([place])   [place], Barbaraann Faster, NP   1 year ago Stage 3a chronic kidney disease ([place])   [place] Cannady, Barbaraann Faster, NP   1 year ago SOB (shortness of breath)   [place], Megan P, DO       Future Appointments             In 6 months Cannady, Barbaraann Faster, NP [employer]

## 2022-03-18 DIAGNOSIS — R002 Palpitations: Secondary | ICD-10-CM | POA: Diagnosis not present

## 2022-04-08 DIAGNOSIS — I471 Supraventricular tachycardia, unspecified: Secondary | ICD-10-CM | POA: Diagnosis not present

## 2022-04-08 DIAGNOSIS — I252 Old myocardial infarction: Secondary | ICD-10-CM | POA: Diagnosis not present

## 2022-04-08 DIAGNOSIS — I4719 Other supraventricular tachycardia: Secondary | ICD-10-CM | POA: Diagnosis not present

## 2022-04-22 DIAGNOSIS — E782 Mixed hyperlipidemia: Secondary | ICD-10-CM | POA: Diagnosis not present

## 2022-04-22 DIAGNOSIS — I10 Essential (primary) hypertension: Secondary | ICD-10-CM | POA: Diagnosis not present

## 2022-04-22 DIAGNOSIS — I2510 Atherosclerotic heart disease of native coronary artery without angina pectoris: Secondary | ICD-10-CM | POA: Diagnosis not present

## 2022-04-22 DIAGNOSIS — I471 Supraventricular tachycardia, unspecified: Secondary | ICD-10-CM | POA: Diagnosis not present

## 2022-04-29 DIAGNOSIS — Z961 Presence of intraocular lens: Secondary | ICD-10-CM | POA: Diagnosis not present

## 2022-04-29 DIAGNOSIS — H524 Presbyopia: Secondary | ICD-10-CM | POA: Diagnosis not present

## 2022-04-29 DIAGNOSIS — H11043 Peripheral pterygium, stationary, bilateral: Secondary | ICD-10-CM | POA: Diagnosis not present

## 2022-05-06 DIAGNOSIS — I10 Essential (primary) hypertension: Secondary | ICD-10-CM | POA: Diagnosis not present

## 2022-05-06 DIAGNOSIS — N1831 Chronic kidney disease, stage 3a: Secondary | ICD-10-CM | POA: Diagnosis not present

## 2022-05-06 DIAGNOSIS — R809 Proteinuria, unspecified: Secondary | ICD-10-CM | POA: Diagnosis not present

## 2022-06-24 ENCOUNTER — Ambulatory Visit: Payer: Self-pay | Admitting: *Deleted

## 2022-06-24 NOTE — Telephone Encounter (Signed)
  Chief Complaint: BP low HR elevated Symptoms: States onset of dizziness Saturday. BP 116/73   HR 129. Throughout day HR elevated,  max 118.  Bp lowest 94/52 Frequency: Saturday Pertinent Negatives: Patient denies No symptoms since Saturday Disposition: '[]'$ ED /'[]'$ Urgent Care (no appt availability in office) / '[]'$ Appointment(In office/virtual)/ '[]'$  [facility]/ '[]'$ Home Care/ '[]'$ Refused Recommended Disposition /'[]'$ [place] [facility]/ '[x]'$  Follow-up with PCP Additional Notes: Pt reports he thinks these episodes are from his thyroid medication. States he sees a cardiologist who "Never finds anything wrong with my heart, I go to the ED and they say nothing wrong with my heart." States took thyroid med Sat when this occurred. CAlled pharmacist who told pt to not take med Sunday and take 1/2 tab Monday. States he has had no symptoms since then. Pt is requesting appt today, no availability. Request he come in for lab work (Thyroid) today. Did secure appt for Thursday and advised ED if symptoms occur. Assured pt NT would route to practice for PCPs review and final disposition.  Please advise. Reason for Disposition  Age > 60 years  (Exception: Brief heartbeat symptoms that went away and now feels well.)  Answer Assessment - Initial Assessment Questions 1. DESCRIPTION: "Please describe your heart rate or heartbeat that you are having" (e.g., fast/slow, regular/irregular, skipped or extra beats, "palpitations")     "BP low and HR all over the place." 2. ONSET: "When did it start?" (Minutes, hours or days)      Saturday 3. DURATION: "How long does it last" (e.g., seconds, minutes, hours)     Usually 30 minutes, most of day Saturday 4. PATTERN "Does it come and go, or has it been constant since it started?"  "Does it get worse with exertion?"   "Are you feeling it now?"     Comes and goes 5. TAP: "Using your hand, can you tap out what you are feeling on a chair or table in front of you, so that I can  hear?" (Note: not all patients can do this)       no 6. HEART RATE: "Can you tell me your heart rate?" "How many beats in 15 seconds?"  (Note: not all patients can do this)       no 7. RECURRENT SYMPTOM: "Have you ever had this before?" If Yes, ask: "When was the last time?" and "What happened that time?"      Since taking med, occurs at times "Never find anything wrong heart wise." 8. CAUSE: "What do you think is causing the palpitations?"     Thyroid med 9. CARDIAC HISTORY: "Do you have any history of heart disease?" (e.g., heart attack, angina, bypass surgery, angioplasty, arrhythmia)      SVT 10. OTHER SYMPTOMS: "Do you have any other symptoms?" (e.g., dizziness, chest pain, sweating, difficulty breathing)       Dizziness at times  Protocols used: Heart Rate and Heartbeat Questions-A-AH

## 2022-06-24 NOTE — Telephone Encounter (Signed)
Noted  

## 2022-06-25 ENCOUNTER — Other Ambulatory Visit: Payer: Self-pay

## 2022-06-25 ENCOUNTER — Observation Stay
Admission: EM | Admit: 2022-06-25 | Discharge: 2022-06-26 | Disposition: A | Discharge status: Home / Self Care | Payer: Medicare Other | Attending: Internal Medicine | Admitting: Internal Medicine

## 2022-06-25 ENCOUNTER — Emergency Department: Payer: Medicare Other

## 2022-06-25 ENCOUNTER — Encounter: Payer: Self-pay | Admitting: *Deleted

## 2022-06-25 DIAGNOSIS — Z79899 Other long term (current) drug therapy: Secondary | ICD-10-CM | POA: Insufficient documentation

## 2022-06-25 DIAGNOSIS — N183 Chronic kidney disease, stage 3 unspecified: Secondary | ICD-10-CM | POA: Insufficient documentation

## 2022-06-25 DIAGNOSIS — I2510 Atherosclerotic heart disease of native coronary artery without angina pectoris: Secondary | ICD-10-CM | POA: Diagnosis not present

## 2022-06-25 DIAGNOSIS — Z7982 Long term (current) use of aspirin: Secondary | ICD-10-CM | POA: Insufficient documentation

## 2022-06-25 DIAGNOSIS — N1831 Chronic kidney disease, stage 3a: Secondary | ICD-10-CM | POA: Diagnosis not present

## 2022-06-25 DIAGNOSIS — F32A Depression, unspecified: Secondary | ICD-10-CM | POA: Diagnosis not present

## 2022-06-25 DIAGNOSIS — E039 Hypothyroidism, unspecified: Secondary | ICD-10-CM | POA: Diagnosis not present

## 2022-06-25 DIAGNOSIS — E785 Hyperlipidemia, unspecified: Secondary | ICD-10-CM | POA: Diagnosis not present

## 2022-06-25 DIAGNOSIS — I129 Hypertensive chronic kidney disease with stage 1 through stage 4 chronic kidney disease, or unspecified chronic kidney disease: Secondary | ICD-10-CM | POA: Insufficient documentation

## 2022-06-25 DIAGNOSIS — R0789 Other chest pain: Secondary | ICD-10-CM | POA: Diagnosis not present

## 2022-06-25 DIAGNOSIS — R079 Chest pain, unspecified: Secondary | ICD-10-CM

## 2022-06-25 DIAGNOSIS — Z955 Presence of coronary angioplasty implant and graft: Secondary | ICD-10-CM | POA: Diagnosis not present

## 2022-06-25 DIAGNOSIS — R002 Palpitations: Secondary | ICD-10-CM

## 2022-06-25 DIAGNOSIS — R6889 Other general symptoms and signs: Secondary | ICD-10-CM | POA: Diagnosis not present

## 2022-06-25 DIAGNOSIS — Z87891 Personal history of nicotine dependence: Secondary | ICD-10-CM | POA: Insufficient documentation

## 2022-06-25 DIAGNOSIS — Z743 Need for continuous supervision: Secondary | ICD-10-CM | POA: Diagnosis not present

## 2022-06-25 DIAGNOSIS — R7989 Other specified abnormal findings of blood chemistry: Secondary | ICD-10-CM | POA: Diagnosis not present

## 2022-06-25 DIAGNOSIS — I471 Supraventricular tachycardia, unspecified: Secondary | ICD-10-CM | POA: Insufficient documentation

## 2022-06-25 LAB — BASIC METABOLIC PANEL
Anion gap: 9 (ref 5–15)
BUN: 27 mg/dL — ABNORMAL HIGH (ref 8–23)
CO2: 23 mmol/L (ref 22–32)
Calcium: 8.6 mg/dL — ABNORMAL LOW (ref 8.9–10.3)
Chloride: 107 mmol/L (ref 98–111)
Creatinine, Ser: 1.43 mg/dL — ABNORMAL HIGH (ref 0.61–1.24)
GFR, Estimated: 49 mL/min — ABNORMAL LOW (ref >60)
Glucose, Bld: 97 mg/dL (ref 70–99)
Potassium: 3.9 mmol/L (ref 3.5–5.1)
Sodium: 139 mmol/L (ref 135–145)

## 2022-06-25 LAB — CBC
HCT: 45.3 % (ref 39.0–52.0)
Hemoglobin: 14.6 g/dL (ref 13.0–17.0)
MCH: 26.3 pg (ref 26.0–34.0)
MCHC: 32.2 g/dL (ref 30.0–36.0)
MCV: 81.6 fL (ref 80.0–100.0)
Platelets: 307 10*3/uL (ref 150–400)
RBC: 5.55 MIL/uL (ref 4.22–5.81)
RDW: 13.9 % (ref 11.5–15.5)
WBC: 9.7 10*3/uL (ref 4.0–10.5)
nRBC: 0 % (ref 0.0–0.2)

## 2022-06-25 LAB — TROPONIN I (HIGH SENSITIVITY)
Troponin I (High Sensitivity): 60 ng/L — ABNORMAL HIGH (ref <18)
Troponin I (High Sensitivity): 107 ng/L (ref <18)
Troponin I (High Sensitivity): 100 ng/L (ref <18)

## 2022-06-25 MED ORDER — NITROGLYCERIN 0.4 MG SL SUBL: 0.4000 mg | SUBLINGUAL_TABLET | SUBLINGUAL | Status: DC | PRN

## 2022-06-25 MED ORDER — TRAZODONE HCL 50 MG PO TABS: 25.0000 mg | ORAL_TABLET | Freq: Every evening | ORAL | Status: DC | PRN

## 2022-06-25 MED ORDER — IRBESARTAN 75 MG PO TABS
75.0000 mg | ORAL_TABLET | Freq: Every day | ORAL | Status: DC
  Administered 2022-06-25 – 2022-06-26 (×2): 75 mg via ORAL
  Filled 2022-06-25 (×3): qty 1

## 2022-06-25 MED ORDER — IRBESARTAN 75 MG PO TABS
75.0000 mg | ORAL_TABLET | Freq: Every day | ORAL | Status: DC
Start: 2022-06-26 — End: 2022-06-25

## 2022-06-25 MED ORDER — ATORVASTATIN CALCIUM 20 MG PO TABS
80.0000 mg | ORAL_TABLET | Freq: Every day | ORAL | Status: DC
  Administered 2022-06-26: 80 mg via ORAL
  Filled 2022-06-25: qty 4

## 2022-06-25 MED ORDER — MORPHINE SULFATE (PF) 2 MG/ML IV SOLN: 2.0000 mg | INTRAVENOUS | Status: DC | PRN

## 2022-06-25 MED ORDER — ENOXAPARIN SODIUM 40 MG/0.4ML IJ SOSY
40.0000 mg | PREFILLED_SYRINGE | INTRAMUSCULAR | Status: DC
  Filled 2022-06-25: qty 0.4

## 2022-06-25 MED ORDER — ASPIRIN 81 MG PO TBEC: 81.0000 mg | DELAYED_RELEASE_TABLET | Freq: Every evening | ORAL | Status: DC

## 2022-06-25 MED ORDER — SODIUM CHLORIDE 0.9 % IV SOLN: INTRAVENOUS | Status: DC

## 2022-06-25 MED ORDER — ACETAMINOPHEN 325 MG PO TABS: 650.0000 mg | ORAL_TABLET | Freq: Four times a day (QID) | ORAL | Status: DC | PRN

## 2022-06-25 MED ORDER — ONDANSETRON HCL 4 MG PO TABS: 4.0000 mg | ORAL_TABLET | Freq: Four times a day (QID) | ORAL | Status: DC | PRN

## 2022-06-25 MED ORDER — MAGNESIUM HYDROXIDE 400 MG/5ML PO SUSP: 30.0000 mL | Freq: Every day | ORAL | Status: DC | PRN

## 2022-06-25 MED ORDER — ACETAMINOPHEN 650 MG RE SUPP: 650.0000 mg | Freq: Four times a day (QID) | RECTAL | Status: DC | PRN

## 2022-06-25 MED ORDER — LEVOTHYROXINE SODIUM 50 MCG PO TABS
75.0000 ug | ORAL_TABLET | Freq: Every day | ORAL | Status: DC
  Administered 2022-06-26: 75 ug via ORAL
  Filled 2022-06-25: qty 1

## 2022-06-25 MED ORDER — ONDANSETRON HCL 4 MG/2ML IJ SOLN: 4.0000 mg | Freq: Four times a day (QID) | INTRAMUSCULAR | Status: DC | PRN

## 2022-06-25 MED ORDER — AMLODIPINE BESYLATE 5 MG PO TABS: 5.0000 mg | ORAL_TABLET | Freq: Once | ORAL | Status: DC

## 2022-06-25 NOTE — ED Notes (Signed)
Lab reports 2nd trop results; acuity level changed, charge nurse notified, will take pt to next avail exam room

## 2022-06-25 NOTE — H&P (Signed)
[place]   PATIENT NAME: Roger Byrd    MR#:  [id number]  DATE OF BIRTH:  11-Mar-1941  DATE OF ADMISSION:  06/25/2022  PRIMARY CARE PHYSICIAN: Corey Skains, MD   Patient is coming from: Home  REQUESTING/REFERRING PHYSICIAN: Merlyn Lot, MD  CHIEF COMPLAINT:   Chief Complaint  Patient presents with   Palpitations    HISTORY OF PRESENT ILLNESS:  Roger Byrd is a 81 y.o. Caucasian male with medical history significant for coronary artery disease, status post PCI and stent in June 2022, and negative nuclear stress study about on 01/30/2022, hypertension, dyslipidemia, stage IIIa CKD, hypothyroidism and anxiety, who presented to the ER with acute onset of chest tightness with associated dizziness and weakness as well as palpitations.  It resolved when he arrived to the ER.  He was given aspirin by EMS.  He stated that his blood pressure has gone as low as 90/50 and his usual is 130s over 70s, and his heart rate went up to 135.  No cough or wheezing or hemoptysis.  No leg pain or edema or recent travels or surgeries.  No dysuria, oliguria or hematuria or flank pain.  ED Course: When he came to the ER BP was 153/84 and later 183/80 with otherwise normal vital signs.  Labs revealed a BUN of 27 and creatinine 1.43.  High sensitive troponin was 60 and later 107.  CBC was within normal. EKG as reviewed by me : EKG showed normal sinus rhythm with a rate of 74 with first-degree AV block and T wave inversion laterally. Imaging: Two-view chest x-ray showed no acute cardiopulmonary disease.  The patient was given 75 mg of p.o. Avapro.  He will be admitted to an observation cardiac telemetry bed for further evaluation and management. PAST MEDICAL HISTORY:   Past Medical History:  Diagnosis Date   Anxiety    Chronic kidney disease    Coronary artery disease    Dysrhythmia    Heart attack ([place])    Hyperlipidemia    Hypertension    Hypothyroidism    Insomnia    Thyroid  disease     PAST SURGICAL HISTORY:   Past Surgical History:  Procedure Laterality Date   BACK SURGERY     x2   CARDIAC CATHETERIZATION     CARPAL TUNNEL RELEASE Right 03/08/2018   Procedure: CARPAL TUNNEL RELEASE;  Surgeon: Earnestine Leys, MD;  Location: [facility] ORS;  Service: Orthopedics;  Laterality: Right;   COLONOSCOPY WITH PROPOFOL N/A 07/19/2015   Procedure: COLONOSCOPY WITH PROPOFOL;  Surgeon: Manya Silvas, MD;  Location: [facility] ENDOSCOPY;  Service: Endoscopy;  Laterality: N/A;   CORONARY ANGIOPLASTY     CORONARY STENT PLACEMENT      SOCIAL HISTORY:   Social History   Tobacco Use   Smoking status: Former    Types: Cigarettes    Quit date: 08/08/1970    Years since quitting: 51.9   Smokeless tobacco: Never  Substance Use Topics   Alcohol use: Not Currently    Alcohol/week: 14.0 standard drinks of alcohol    Types: 14 Glasses of wine per week    Comment: 6 oz glasses/ red wine     FAMILY HISTORY:   Family History  Problem Relation Age of Onset   Hypertension Mother    Heart disease Mother        heart attack   Hypertension Sister    Emphysema Father    Hypertension Daughter    Hypertension  Son    Hypertension Sister    Hypertension Sister    Emphysema Sister     DRUG ALLERGIES:  No Known Allergies  REVIEW OF SYSTEMS:   ROS As per history of present illness. All pertinent systems were reviewed above. Constitutional, HEENT, cardiovascular, respiratory, GI, GU, musculoskeletal, neuro, psychiatric, endocrine, integumentary and hematologic systems were reviewed and are otherwise negative/unremarkable except for positive findings mentioned above in the HPI.   MEDICATIONS AT HOME:   Prior to Admission medications   Medication Sig Start Date End Date Taking? Authorizing Provider  aspirin 81 MG tablet Take 81 mg by mouth every evening.    Yes [provider]  atorvastatin (LIPITOR) 80 MG tablet Take 1 tablet by mouth daily at 12 noon. 05/01/15  Yes  [provider]  levothyroxine (SYNTHROID) 75 MCG tablet Take 1 tablet (75 mcg total) by mouth daily. 10/08/21  Yes Cannady, Jolene T, NP  valsartan (DIOVAN) 80 MG tablet Take 40 mg by mouth at bedtime.   Yes [provider]      VITAL SIGNS:  Blood pressure (!) 183/80, pulse 64, temperature 98 F (36.7 C), temperature source Oral, resp. rate 18, height '5\' 8"'$  (1.727 m), weight 64.4 kg, SpO2 94 %.  PHYSICAL EXAMINATION:  Physical Exam  GENERAL:  81 y.o.-year-old Caucasian male patient lying in the bed with no acute distress.  EYES: Pupils equal, round, reactive to light and accommodation. No scleral icterus. Extraocular muscles intact.  HEENT: Head atraumatic, normocephalic. Oropharynx and nasopharynx clear.  NECK:  Supple, no jugular venous distention. No thyroid enlargement, no tenderness.  LUNGS: Normal breath sounds bilaterally, no wheezing, rales,rhonchi or crepitation. No use of accessory muscles of respiration.  CARDIOVASCULAR: Regular rate and rhythm, S1, S2 normal. No murmurs, rubs, or gallops.  ABDOMEN: Soft, nondistended, nontender. Bowel sounds present. No organomegaly or mass.  EXTREMITIES: No pedal edema, cyanosis, or clubbing.  NEUROLOGIC: Cranial nerves II through XII are intact. Muscle strength 5/5 in all extremities. Sensation intact. Gait not checked.  PSYCHIATRIC: The patient is alert and oriented x 3.  Normal affect and good eye contact. SKIN: No obvious rash, lesion, or ulcer.   LABORATORY PANEL:   CBC Recent Labs  Lab 06/25/22 1522  WBC 9.7  HGB 14.6  HCT 45.3  PLT 307   ------------------------------------------------------------------------------------------------------------------  Chemistries  Recent Labs  Lab 06/25/22 1522  NA 139  K 3.9  CL 107  CO2 23  GLUCOSE 97  BUN 27*  CREATININE 1.43*  CALCIUM 8.6*    ------------------------------------------------------------------------------------------------------------------  Cardiac Enzymes No results for input(s): "TROPONINI" in the last 168 hours. ------------------------------------------------------------------------------------------------------------------  RADIOLOGY:  DG Chest 2 View  Result Date: 06/25/2022 CLINICAL DATA:  Palpitations. EXAM: CHEST - 2 VIEW COMPARISON:  Chest x-ray 03/19/2021. FINDINGS: The heart size and mediastinal contours are within normal limits. Both lungs are clear. No visible pleural effusions or pneumothorax. No acute osseous abnormality. IMPRESSION: No active cardiopulmonary disease. Electronically Signed   By: Margaretha Sheffield M.D.   On: 06/25/2022 15:45      IMPRESSION AND PLAN:  Assessment and Plan: * Chest pain - The patient will be admitted to an observation cardiac telemetry bed. - The patient has associated palpitations. - Will follow serial troponins and EKGs. - The patient will be placed on aspirin as well as p.r.n. sublingual nitroglycerin and morphine sulfate for pain. - She will be monitored for arrhythmias. - We will obtain a cardiology consult in a.m. for further cardiac risk stratification. - I  notified Dr. Clayborn Bigness about the patient.  Elevated troponin - This could be an evolving non-STEMI. - We will follow further troponin I's. - He will be continued on high-dose statin therapy. - We will continue his ARB therapy. - We will add small dose beta-blocker therapy.  Dyslipidemia - We will Continue statin therapy. - We will check fasting lipids in AM.  Chronic kidney disease (CKD), stage III (moderate) (HCC) - The patient has stage IIIa chronic kidney disease that is stable. - Will place on hydration with IV normal saline this year. - Will follow BMP.  Hypothyroidism - We will continue Synthroid.   DVT prophylaxis: Lovenox.  Advanced Care Planning:  Code Status: full code.   Family Communication:  The plan of care was discussed in details with the patient (and family). I answered all questions. The patient agreed to proceed with the above mentioned plan. Further management will depend upon hospital course. Disposition Plan: Back to previous home environment Consults called: Cardiology. All the records are reviewed and case discussed with ED provider.  Status is: Observation  I certify that at the time of admission, it is my clinical judgment that the patient will require hospital care extending less than 2 midnights.                            Dispo: The patient is from: Home              Anticipated d/c is to: Home              Patient currently is not medically stable to d/c.              Difficult to place patient: No  Christel Mormon M.D on 06/26/2022 at 12:03 AM  [facility]   From 7 PM-7 AM, contact night-coverage [email or web address]  CC: Primary care physician; Corey Skains, MD

## 2022-06-25 NOTE — Assessment & Plan Note (Signed)
-   We will Continue statin therapy. - We will check fasting lipids in AM.

## 2022-06-25 NOTE — Assessment & Plan Note (Addendum)
-   The patient will be admitted to an observation cardiac telemetry bed. - The patient has associated palpitations. - Will follow serial troponins and EKGs. - The patient will be placed on aspirin as well as p.r.n. sublingual nitroglycerin and morphine sulfate for pain. - She will be monitored for arrhythmias. - We will obtain a cardiology consult in a.m. for further cardiac risk stratification. - I notified Dr. Clayborn Bigness about the patient.

## 2022-06-25 NOTE — ED Provider Triage Note (Signed)
Emergency Medicine Provider Triage Evaluation Note  Roger Byrd, a 81 y.o. male  was evaluated in triage.  Pt complains of palpitations.  Presents to the ED via EMS from home, where he reports his heart was racing.  Denies any frank chest pain or shortness of breath.  Also denies any nausea, vomiting, diarrhea, or diaphoresis.  She reports sinus tachycardia at 130, with spontaneous conversion back to the 70s.  Review of Systems  Positive: tachycardia Negative: CP SOB  Physical Exam  BP (!) 183/80 (BP Location: Left Arm)   Pulse 64   Temp 98 F (36.7 C) (Oral)   Resp 18   Ht '5\' 8"'$  (1.727 m)   Wt 64.4 kg   SpO2 94%   BMI 21.59 kg/m  Gen:   Awake, no distress  NAD Resp:  Normal effort CTA MSK:   Moves extremities without difficulty  Other:    Medical Decision Making  Medically screening exam initiated at 7:59 PM.  Appropriate orders placed.  Roger Byrd was informed that the remainder of the evaluation will be completed by another provider, this initial triage assessment does not replace that evaluation, and the importance of remaining in the ED until their evaluation is complete.  Geriatric patient to the ED for evaluation of tachycardia.  Patient with a history of cardiac stents, reports symptoms intermittently since Saturday.  He presents to the ED via EMS from home.  No syncope, chest pain, shortness breath reported.   Melvenia Needles, PA-C 06/25/22 2000

## 2022-06-25 NOTE — Assessment & Plan Note (Signed)
-   This could be an evolving non-STEMI. - We will follow further troponin I's. - He will be continued on high-dose statin therapy. - We will continue his ARB therapy. - We will add small dose beta-blocker therapy.

## 2022-06-25 NOTE — ED Triage Notes (Signed)
Pt brought in via ems from home.  Pt reports heart is running away.  No chest pain or sob.  No no n/v/d.  No diaphoresis.  Pt alert  speech clear.  Iv in place.

## 2022-06-25 NOTE — ED Provider Notes (Signed)
[facility] Provider Note    Event Date/Time   First MD Initiated Contact with Patient 06/25/22 2142     (approximate)   History   Palpitations   HPI  Roger Byrd is a 81 y.o. male.  CAD as well as admitted palpitations dysrhythmia not on any anticoagulation previous admissions for chest pain rule out.  Found to be in accelerated junctional rhythms.  Presents to the ER for evaluation of "racing heart and chest discomfort.  Symptoms occurred today did have some tightness in his chest.  Symptoms resolved by time he got to the ER.  Was given aspirin by EMS.  He is currently pain-free.  He has been compliant with his medications was previously on amiodarone but is not currently on that.     Physical Exam   Triage Vital Signs: ED Triage Vitals  Enc Vitals Group     BP 06/25/22 1523 (!) 153/84     Pulse Rate 06/25/22 1523 75     Resp 06/25/22 1523 20     Temp 06/25/22 1523 98.6 F (37 C)     Temp Source 06/25/22 1952 Oral     SpO2 06/25/22 1523 99 %     Weight 06/25/22 1519 142 lb (64.4 kg)     Height 06/25/22 1519 '5\' 8"'$  (1.727 m)     Head Circumference --      Peak Flow --      Pain Score 06/25/22 1519 0     Pain Loc --      Pain Edu? --      Excl. in [place]? --     Most recent vital signs: Vitals:   06/25/22 1523 06/25/22 1952  BP: (!) 153/84 (!) 183/80  Pulse: 75 64  Resp: 20 18  Temp: 98.6 F (37 C) 98 F (36.7 C)  SpO2: 99% 94%     Constitutional: Alert  Eyes: Conjunctivae are normal.  Head: Atraumatic. Nose: No congestion/rhinnorhea. Mouth/Throat: Mucous membranes are moist.   Neck: Painless ROM.  Cardiovascular:   Good peripheral circulation. Respiratory: Normal respiratory effort.  No retractions.  Gastrointestinal: Soft and nontender.  Musculoskeletal:  no deformity Neurologic:  MAE spontaneously. No gross focal neurologic deficits are appreciated.  Skin:  Skin is warm, dry and intact. No rash noted. Psychiatric: Mood and  affect are normal. Speech and behavior are normal.    ED Results / Procedures / Treatments   Labs (all labs ordered are listed, but only abnormal results are displayed) Labs Reviewed  BASIC METABOLIC PANEL - Abnormal; Notable for the following components:      Result Value   BUN 27 (*)    Creatinine, Ser 1.43 (*)    Calcium 8.6 (*)    GFR, Estimated 49 (*)    All other components within normal limits  TROPONIN I (HIGH SENSITIVITY) - Abnormal; Notable for the following components:   Troponin I (High Sensitivity) 60 (*)    All other components within normal limits  TROPONIN I (HIGH SENSITIVITY) - Abnormal; Notable for the following components:   Troponin I (High Sensitivity) 107 (*)    All other components within normal limits  CBC     EKG  ED ECG REPORT I, Merlyn Lot, the attending physician, personally viewed and interpreted this ECG.   Date: 06/25/2022  EKG Time: 15:19  Rate: 75  Rhythm: sinus with 1st degree  Axis: left  Intervals: normalqt  ST&T Change: nonspecific st abn, no stemi  RADIOLOGY Please see ED Course for my review and interpretation.  I personally reviewed all radiographic images ordered to evaluate for the above acute complaints and reviewed radiology reports and findings.  These findings were personally discussed with the patient.  Please see medical record for radiology report.    PROCEDURES:  Critical Care performed: No  Procedures   MEDICATIONS ORDERED IN ED: Medications  irbesartan (AVAPRO) tablet 75 mg (has no administration in time range)     IMPRESSION / MDM / ASSESSMENT AND PLAN / ED COURSE  I reviewed the triage vital signs and the nursing notes.                              Differential diagnosis includes, but is not limited to, ACS, pericarditis, esophagitis, boerhaaves, pe, dissection, pna, bronchitis, costochondritis  Patient presenting to the ER for evaluation of symptoms as described above.  Based on  symptoms, risk factors and considered above differential, this presenting complaint could reflect a potentially life-threatening illness therefore the patient will be placed on continuous pulse oximetry and telemetry for monitoring.  Laboratory evaluation will be sent to evaluate for the above complaints.  Patient currently well-appearing but with complex past medical history and risk factors with CAD.  Initial troponin was mildly elevated with delta troponin rising.  Possible that this is just demand ischemia in the setting of SVT or accelerated junctional rhythm dysrhythmia like he has had in the past but do not have any EKG evidence to confirm this.  Given these findings and his risk factors do feel patient will require hospitalization for telemetry monitoring cardiology consultation.  Will consult hospitalist for admission.  He remains pain-free therefore I do not feel the patient requires heparin at this time.  He is mildly hypertensive but is due for his evening blood pressure medication.        FINAL CLINICAL IMPRESSION(S) / ED DIAGNOSES   Final diagnoses:  Palpitations  Chest pain, unspecified type     Rx / DC Orders   ED Discharge Orders     None        Note:  This document was prepared using Dragon voice recognition software and may include unintentional dictation errors.    Merlyn Lot, MD 06/25/22 2229

## 2022-06-25 NOTE — Assessment & Plan Note (Signed)
-   We will continue Synthroid. 

## 2022-06-25 NOTE — ED Triage Notes (Signed)
First nurse Note:  Arrives from home via [facility]. Stent placed 2  years ago.  States Saturday and today had episode of palpitations.  On EMS arrival, HR 130 then converted back to HR 70's.  324 mg ASA.  18g LAC 500 cc NS given.  VS wnl.

## 2022-06-26 ENCOUNTER — Ambulatory Visit: Payer: Medicare Other | Admitting: Physician Assistant

## 2022-06-26 DIAGNOSIS — I2510 Atherosclerotic heart disease of native coronary artery without angina pectoris: Secondary | ICD-10-CM | POA: Diagnosis not present

## 2022-06-26 DIAGNOSIS — N183 Chronic kidney disease, stage 3 unspecified: Secondary | ICD-10-CM | POA: Insufficient documentation

## 2022-06-26 DIAGNOSIS — I471 Supraventricular tachycardia, unspecified: Secondary | ICD-10-CM

## 2022-06-26 DIAGNOSIS — I4719 Other supraventricular tachycardia: Secondary | ICD-10-CM | POA: Diagnosis not present

## 2022-06-26 LAB — CBC
HCT: 43.6 % (ref 39.0–52.0)
Hemoglobin: 13.9 g/dL (ref 13.0–17.0)
MCH: 25.9 pg — ABNORMAL LOW (ref 26.0–34.0)
MCHC: 31.9 g/dL (ref 30.0–36.0)
MCV: 81.3 fL (ref 80.0–100.0)
Platelets: 279 10*3/uL (ref 150–400)
RBC: 5.36 MIL/uL (ref 4.22–5.81)
RDW: 14 % (ref 11.5–15.5)
WBC: 6.3 10*3/uL (ref 4.0–10.5)
nRBC: 0 % (ref 0.0–0.2)

## 2022-06-26 LAB — BASIC METABOLIC PANEL
Anion gap: 7 (ref 5–15)
BUN: 23 mg/dL (ref 8–23)
CO2: 22 mmol/L (ref 22–32)
Calcium: 8.4 mg/dL — ABNORMAL LOW (ref 8.9–10.3)
Chloride: 112 mmol/L — ABNORMAL HIGH (ref 98–111)
Creatinine, Ser: 1.19 mg/dL (ref 0.61–1.24)
GFR, Estimated: >60 mL/min (ref >60)
Glucose, Bld: 97 mg/dL (ref 70–99)
Potassium: 3.8 mmol/L (ref 3.5–5.1)
Sodium: 141 mmol/L (ref 135–145)

## 2022-06-26 LAB — MAGNESIUM: Magnesium: 2.2 mg/dL (ref 1.7–2.4)

## 2022-06-26 LAB — TROPONIN I (HIGH SENSITIVITY): Troponin I (High Sensitivity): 87 ng/L — ABNORMAL HIGH (ref <18)

## 2022-06-26 MED ORDER — METOPROLOL TARTRATE 25 MG PO TABS: 25.0000 mg | ORAL_TABLET | Freq: Four times a day (QID) | ORAL | 3 refills | Status: AC | PRN

## 2022-06-26 MED ORDER — METOPROLOL TARTRATE 25 MG PO TABS: 25.0000 mg | ORAL_TABLET | Freq: Four times a day (QID) | ORAL | Status: DC | PRN

## 2022-06-26 NOTE — Consult Note (Signed)
[facility] NOTE       Patient ID: DEMARIOUS KAPUR MRN: [id number] DOB/AGE: [birth year] 81 y.o.  Admit date: 06/25/2022 Referring Physician Dr. Eugenie Norrie Primary Physician Dr. Charlynne Cousins Primary Cardiologist Dr. Nehemiah Massed Reason for Consultation elevated troponin  HPI: Mana Morison. Seth is an 26yoM with a PMH of CAD (hx NSTEMI 11/2020 s/p PCI with DES to rPL), HFpEF (>55%, G1 DD, mild valvular insufficiencies, mild LVH 01/2022), paroxysmal SVT, CKD 3 who presented to [facility] ED 06/25/2022 with heart racing and associated chest discomfort.  Cardiology is consulted for further assistance.  Patient presents with his daughter who contributes to the history.  He has had multiple episodes over the past year and a half of dizziness, palpitations, and chest tightness, typically occurring for several minutes to an hour at a time, resolving with sitting and resting.  He has had 3 episodes prompting his presentation to the emergency department, including this visit.  On Saturday (Christmas Day) his heart racing and dizziness lasted for about 12 hours, before subsiding, yesterday, which was the anniversary of the passing of his wife, he noted these palpitations, dizziness, and chest tightness and decided to call EMS.  He felt better while en route to the ED, and has had no recurrence of his symptoms while in the emergency department.  He denies ever fully passing out, but definitely feels lightheaded.  Notes his blood pressure is low in the 74B-44H systolic and his heart rate is in the 130s per his home BP cuff.  He is rather active, golfed 5 days last week and denies any shortness of breath or chest pain with exertion or activity apart from these episodes of heart racing.  Denies peripheral edema, orthopnea, PND.  He acknowledges that emotional stress makes the symptoms worse, and realizes he has not been drinking as much water while he is golfing since the weather has gotten cooler.  He was  given full dose aspirin by EMS, and started on normal saline infusion, and given his home dose of hospital equivalent ARB irbesartan 75 mg.  Labs notable for BUN/creatinine 27/1.43 and GFR 49, improving to 23/1.19 and GFR >60 today, high-sensitivity troponin elevated with a flat trend at 60, 107, 100, 87.  H&H 13.9/43.6, platelets 279.  Chest x-ray is without active cardiopulmonary disease.  Review of systems complete and found to be negative unless listed above     Past Medical History:  Diagnosis Date   Anxiety    Chronic kidney disease    Coronary artery disease    Dysrhythmia    Heart attack ([place])    Hyperlipidemia    Hypertension    Hypothyroidism    Insomnia    Thyroid disease     Past Surgical History:  Procedure Laterality Date   BACK SURGERY     x2   CARDIAC CATHETERIZATION     CARPAL TUNNEL RELEASE Right 03/08/2018   Procedure: CARPAL TUNNEL RELEASE;  Surgeon: Earnestine Leys, MD;  Location: [facility] ORS;  Service: Orthopedics;  Laterality: Right;   COLONOSCOPY WITH PROPOFOL N/A 07/19/2015   Procedure: COLONOSCOPY WITH PROPOFOL;  Surgeon: Manya Silvas, MD;  Location: [facility] ENDOSCOPY;  Service: Endoscopy;  Laterality: N/A;   CORONARY ANGIOPLASTY     CORONARY STENT PLACEMENT      (Not in a hospital admission)  Social History   Socioeconomic History   Marital status: Widowed    Spouse name: Not on file   Number of children: Not on file  Years of education: 58   Highest education level: 12th grade  Occupational History   Occupation: retired   Tobacco Use   Smoking status: Former    Types: Cigarettes    Quit date: 08/08/1970    Years since quitting: 51.9   Smokeless tobacco: Never  Vaping Use   Vaping Use: Never used  Substance and Sexual Activity   Alcohol use: Not Currently    Alcohol/week: 14.0 standard drinks of alcohol    Types: 14 Glasses of wine per week    Comment: 6 oz glasses/ red wine    Drug use: No   Sexual activity: Not Currently  Other  Topics Concern   Not on file  Social History Narrative   Not on file   Social Determinants of Health   Financial Resource Strain: Low Risk  (12/05/2021)   Overall Financial Resource Strain (CARDIA)    Difficulty of Paying Living Expenses: Not hard at all  Food Insecurity: No Food Insecurity (12/05/2021)   Hunger Vital Sign    Worried About Running Out of Food in the Last Year: Never true    [place] in the Last Year: Never true  Transportation Needs: No Transportation Needs (02/20/2022)   PRAPARE - Hydrologist (Medical): No    Lack of Transportation (Non-Medical): No  Physical Activity: Sufficiently Active (12/05/2021)   Exercise Vital Sign    Days of Exercise per Week: 4 days    Minutes of Exercise per Session: 60 min  Stress: No Stress Concern Present (12/05/2021)   [place]    Feeling of Stress : Not at all  Social Connections: Moderately Integrated (12/05/2021)   Social Connection and Isolation Panel [NHANES]    Frequency of Communication with Friends and Family: More than three times a week    Frequency of Social Gatherings with Friends and Family: More than three times a week    Attends Religious Services: More than 4 times per year    Active Member of [employer] or Organizations: Yes    Attends Archivist Meetings: More than 4 times per year    Marital Status: Widowed  Intimate Partner Violence: Not At Risk (02/14/2021)   Humiliation, Afraid, Rape, and Kick questionnaire    Fear of Current or Ex-Partner: No    Emotionally Abused: No    Physically Abused: No    Sexually Abused: No    Family History  Problem Relation Age of Onset   Hypertension Mother    Heart disease Mother        heart attack   Hypertension Sister    Emphysema Father    Hypertension Daughter    Hypertension Son    Hypertension Sister    Hypertension Sister    Emphysema Sister      No intake or  output data in the 24 hours ending 06/26/22 0941  Vitals:   06/26/22 0600 06/26/22 0700 06/26/22 0800 06/26/22 0900  BP: 136/62 (!) 146/73 (!) 166/111 (!) 153/59  Pulse: (!) 50 60 (!) 55 (!) 56  Resp: '12 20 16 16  '$ Temp:      TempSrc:      SpO2: 94% 100% 97% 97%  Weight:      Height:        PHYSICAL EXAM General: pleasant well appearing caucasian male , well nourished, in no acute distress. Sitting upright in ED stretcher, daughter at bedside  HEENT:  Normocephalic  and atraumatic. Neck:  No JVD.  Lungs: Normal respiratory effort on room air. Clear bilaterally to auscultation. No wheezes, crackles, rhonchi.  Heart: HRRR . Normal S1 and S2 without gallops or murmurs.  Abdomen: Non-distended appearing.  Msk: Normal strength and tone for age. Extremities: Warm and well perfused. No clubbing, cyanosis. No peripheral edema.  Neuro: Alert and oriented X 3. Psych:  Answers questions appropriately.   Labs: Basic Metabolic Panel: Recent Labs    06/25/22 1522 06/26/22 0522  NA 139 141  K 3.9 3.8  CL 107 112*  CO2 23 22  GLUCOSE 97 97  BUN 27* 23  CREATININE 1.43* 1.19  CALCIUM 8.6* 8.4*   Liver Function Tests: No results for input(s): "AST", "ALT", "ALKPHOS", "BILITOT", "PROT", "ALBUMIN" in the last 72 hours. No results for input(s): "LIPASE", "AMYLASE" in the last 72 hours. CBC: Recent Labs    06/25/22 1522 06/26/22 0522  WBC 9.7 6.3  HGB 14.6 13.9  HCT 45.3 43.6  MCV 81.6 81.3  PLT 307 279   Cardiac Enzymes: Recent Labs    06/25/22 1958 06/25/22 2255 06/26/22 0522  TROPONINIHS 107* 100* 87*   BNP: No results for input(s): "BNP" in the last 72 hours. D-Dimer: No results for input(s): "DDIMER" in the last 72 hours. Hemoglobin A1C: No results for input(s): "HGBA1C" in the last 72 hours. Fasting Lipid Panel: No results for input(s): "CHOL", "HDL", "LDLCALC", "TRIG", "CHOLHDL", "LDLDIRECT" in the last 72 hours. Thyroid Function Tests: No results for input(s):  "TSH", "T4TOTAL", "T3FREE", "THYROIDAB" in the last 72 hours.  Invalid input(s): "FREET3" Anemia Panel: No results for input(s): "VITAMINB12", "FOLATE", "FERRITIN", "TIBC", "IRON", "RETICCTPCT" in the last 72 hours.   Radiology: DG Chest 2 View  Result Date: 06/25/2022 CLINICAL DATA:  Palpitations. EXAM: CHEST - 2 VIEW COMPARISON:  Chest x-ray 03/19/2021. FINDINGS: The heart size and mediastinal contours are within normal limits. Both lungs are clear. No visible pleural effusions or pneumothorax. No acute osseous abnormality. IMPRESSION: No active cardiopulmonary disease. Electronically Signed   By: Margaretha Sheffield M.D.   On: 06/25/2022 15:45    Treadmill Myoview 01/30/2022 Impression   Normal treadmill EKG without evidence of ischemia or arrhythmia Normal myocardial perfusion without evidence of myocardial ischemia  Serafina Royals  12/26/2020 LHC and intervention at [facility] Findings:  1. Coronary artery disease including 99% stenosis of the rPL branch, s/p  DESx1 with excellent angiographic result .  2. Mildly elevated left ventricular filling pressures (LVEDP = 14 mm Hg).  3. Successful PCI to the RPL with the placement of a 3.0 x 18 mm Xience  Skypoint DES with excellent angiographic result and TIMI 3 flow.   Recommendations:  1. Aggressive secondary prevention.  2. Dual antiplatelet therapy with aspirin and ticagrelor for at least 12  months.   Complications:   None   Hemodynamics and Left Heart Catheterization   Aortic pressure: 130/56 mm Hg (mean 85 mm Hg)   Left ventricular filling pressure: 127/3 (LVEDP = 14 mm Hg).   Coronary Angiography   Dominance: Right   Left Main:  The left main coronary artery (LMCA) is a large-caliber vessel  that originates from the left coronary sinus. It bifurcates into the left  anterior descending (LAD) and left circumflex (LCx) arteries. There is  mild luminal irregularity in the distal LMCA.   LAD:  The LAD is a moderate to  large-caliber vessel that gives off 2  diagonal (D) branches before it wraps around the apex. D1 is a  moderate-caliber vessel. D2 is a small-caliber vessel.  There are serial  luminal irregularities within the LAD up to 20% in the proximal segment.   Left Circumflex:  The LCx is a moderate-caliber vessel that gives off one  obtuse marginal (OM) branch and then continues as a small vessel in the AV  groove. OM1 is a small-caliber vessel. There is no angiographic evidence  of significant disease in the LCx.   Right Coronary:  The right coronary artery (RCA) is a large-caliber vessel  originating from the right coronary sinus. It bifurcates distally into the  posterior descending artery (PDA) and a large posterolateral (PL) branch  consistent with a right dominant system. There are serial lesions in the  RCA including 20% proximal RCA, 30% midRCA, 20%ostial PDA, and 99% rPL  (cuplrit lesion).    PCI Indication  Presentation: NSTEMI  Angina class: Class IV  Assessment for dual-antiplatelet therapy: Appropriate  Pre-procedure non-invasive study: NA  FFR (if performed): NA    Complications:  None  Blood loss:  Minimal  Specimen:  None  Device/Implants:  3.0 x 18 mm Xience Skypoint DES   Pre-procedure Dx:  NSTEMI  Post-procedure Dx:  Same   ECHO 02/26/2022 ECHOCARDIOGRAPHIC MEASUREMENTS  2D DIMENSIONS  AORTA                  Values   Normal Range   MAIN PA         Values    Normal Range                Annulus: 1.8 cm       [2.3-2.9]         PA Main: nm*       [1.5-2.1]              Aorta Sin: 2.5 cm       [3.1-3.7]    RIGHT VENTRICLE            ST Junction: nm*          [2.6-3.2]         RV Base: nm*       [<4.2]             Asc.Aorta: 2.9 cm       [2.6-3.4]          RV Mid: 3.2 cm    [<3.5]  LEFT VENTRICLE                                      RV Length: nm*       [<8.6]                 LVIDd: 4.4 cm       [4.2-5.9]    INFERIOR VENA CAVA                  LVIDs: 3.1 cm                         Max. IVC: nm*       [<=2.1]                    FS: 29.7 %       [>25]            Min. IVC: nm*  SWT: 1.2 cm       [0.6-1.0]    ------------------                    PWT: 1.0 cm       [0.6-1.0]    nm* - not measured  LEFT ATRIUM                LA Diam: 4.0 cm       [3.0-4.0]            LA A4C Area: nm*          [<20]             LA Volume: nm*          [18-58]  _________________________________________________________________________________________  ECHOCARDIOGRAPHIC DESCRIPTIONS  AORTIC ROOT                   Size: Normal             Dissection: INDETERM FOR DISSECTION  AORTIC VALVE               Leaflets: Tricuspid                   Morphology: MILDLY THICKENED               Mobility: Fully mobile  LEFT VENTRICLE                   Size: Normal                        Anterior: Normal            Contraction: Normal                         Lateral: Normal             Closest EF: >55% (Estimated)                Septal: Normal              LV Masses: No Masses                       Apical: Normal                    LVH: MILD LVH                      Inferior: Normal                                                      Posterior: Normal           Dias.FxClass: (Grade 1) relaxation abnormal, E/A reversal  MITRAL VALVE               Leaflets: Normal                        Mobility: Fully mobile             Morphology: Normal  LEFT ATRIUM                   Size: Normal  LA Masses: No masses              IA Septum: Normal IAS  MAIN PA                   Size: Normal  PULMONIC VALVE             Morphology: Normal                        Mobility: Fully mobile  RIGHT VENTRICLE              RV Masses: No Masses                         Size: Normal              Free Wall: Normal                     Contraction: Normal  TRICUSPID VALVE               Leaflets: Normal                        Mobility: Fully mobile             Morphology: Normal   RIGHT ATRIUM                   Size: Normal                        RA Other: None                RA Mass: No masses  PERICARDIUM                 Fluid: No effusion  INFERIOR VENACAVA                   Size: Normal Normal respiratory collapse  _________________________________________________________________________________________   DOPPLER ECHO and OTHER SPECIAL PROCEDURES                 Aortic: MILD AR                    No AS                         119.0 cm/sec peak vel      5.7 mmHg peak grad                 Mitral: MILD MR                    No MS                         MV Inflow E Vel = 70.2 cm/sec     MV Annulus E'Vel = 4.7 cm/sec                         E/E'Ratio = 14.9              Tricuspid: MILD TR                    No TS  295.0 cm/sec peak TR vel   37.8 mmHg peak RV pressure              Pulmonary: MILD PR                    No PS                         178.0 cm/sec peak vel      12.7 mmHg peak grad  _________________________________________________________________________________________  INTERPRETATION  NORMAL LEFT VENTRICULAR SYSTOLIC FUNCTION   WITH MILD LVH  NORMAL RIGHT VENTRICULAR SYSTOLIC FUNCTION  NO VALVULAR STENOSIS  MILD AR, MR, TR ,PR  EF 55%   TELEMETRY reviewed by me (LT) 06/26/2022 : NSR 1* V block   EKG reviewed by me: Sinus rhythm with first-degree AV block  Data reviewed by me (LT) 06/26/2022: last cardiology note, ed note, admission H&P, cbc, bnp, vitals, tele, troponins   Principal Problem:   Chest pain Active Problems:   Hypothyroidism   Elevated troponin   Dyslipidemia   Palpitations   Chronic kidney disease (CKD), stage III (moderate) ([place])    ASSESSMENT AND PLAN:  Dominica Severin A. Frisina is an 46yoM with a PMH of CAD (hx NSTEMI 11/2020 s/p PCI with DES to rPL), HFpEF (>55%, G1 DD, mild valvular insufficiencies, mild LVH 01/2022), paroxysmal SVT, CKD 3 who presented to [place] ED 06/25/2022 with heart racing and  associated chest discomfort.  Cardiology is consulted for further assistance.  # Paroxysmal supraventricular tachycardia # Symptomatic palpitations Multiple episodes of heart racing and associated dizziness and chest tightness occurring over the past year and a half, typically lasting several minutes to 1 hour and self terminating.  On Christmas Day he had the symptoms for 12 hours before they went away on their own.  Provoked by emotional stress and dehydration.  14-day Holter monitor in October 2023 showed baseline NSR with heart rate range from 43-147 bpm with occasional PACs and PVCs varying based on activity levels with 3 beats of SVT without other significant rhythm disturbances. EKG on admission shows NSR with first degree AV block and rate 74. On tele in sinus bradycardia in the 50s primarily. No evidence of SVT or atrial fibrillation on tele.  -continue tele while inpatient -recommend PRN metoprolol tartrate '25mg'$  q6h for sustained HR >130 for 15 minutes and if SBP >110 if refractory to conservative measures -magnesium supplementation nightly  -encouraged him to stay well hydrated, especially while outside golfing even in cooler weather -no further cardiac diagnostics necessary -he is ok for discharge from a cardiac standpoint today, follow up with Dr. Saralyn Pilar or Leroy Libman, PA-C in 1-2 weeks. Consider referral to EP on an outpatient basis if he remains symptomatic  # Demand ischemia # CAD s/p PCI with DES to RPL 11/2020 Troponin elevation at 60, 107, 100, 87 in the absence of ischemic EKG changes or angina, this is most consistent with demand/supply mismatch and not ACS.  -Continue aspirin 81 mg daily -Continue atorvastatin '80mg'$  daily  -Continue valsartan   This patient's plan of care was discussed and created with Dr. Saralyn Pilar and he is in agreement.  Signed: Tristan Schroeder , PA-C 06/26/2022, 9:41 AM [facility]

## 2022-06-26 NOTE — Discharge Summary (Signed)
Physician Discharge Summary  OLAJUWON FOSDICK ZDG:[id number] DOB: Nov 22, 1940 DOA: 06/25/2022  PCP: Corey Skains, MD  Admit date: 06/25/2022 Discharge date: 06/26/2022  Admitted From: Home Disposition: Home  Recommendations for Outpatient Follow-up:  Follow up with PCP in 1-2 weeks Follow-up with cardiology in 1 week Metoprolol tartrate '25mg'$  q6h PRN for sustained HR >130 for 15 minutes and if SBP >110 per cardiology recommendations  Home Health: No Equipment/Devices: None  Discharge Condition: Stable CODE STATUS: Full code Diet recommendation: Heart healthy diet  History of present illness:  Roger Byrd is a 81 y.o. Caucasian male with medical history significant for coronary artery disease, status post PCI and stent in June 2022, and negative nuclear stress study about on 01/30/2022, hypertension, dyslipidemia, stage IIIa CKD, hypothyroidism and anxiety, who presented to the ER with acute onset of chest tightness with associated dizziness and weakness as well as palpitations.  It resolved when he arrived to the ER.  He was given aspirin by EMS.  He stated that his blood pressure has gone as low as 90/50 and his usual is 130s over 70s, and his heart rate went up to 135.  No cough or wheezing or hemoptysis.  No leg pain or edema or recent travels or surgeries.  No dysuria, oliguria or hematuria or flank pain.   In the ED, BP was 153/84 and later 183/80 with otherwise normal vital signs.  Labs revealed a BUN of 27 and creatinine 1.43.  High sensitive troponin was 60 and later 107.  CBC was within normal. EKG showed normal sinus rhythm with a rate of 74 with first-degree AV block and T wave inversion laterally. Two-view chest x-ray showed no acute cardiopulmonary disease. The patient was given 75 mg of p.o. Avapro.  TRH consulted for admission further evaluation and management.  Hospital course:  Paroxysmal supraventricular tachycardia Symptomatic palpitations Patient presenting  with recurrent episodes of palpitations associate with dizziness, chest tightness.  Patient reports lasting approximately 30-40 minutes with self termination.  3 episodes over the last 1 month.  Seen by cardiology previously with Holter monitor October 2023 PAC's, PVC's and 3 beats of SVT without other significant rhythm disturbances.  Seen by cardiology with recommendation, Toprol tartrate 25 mg every 6 hours as needed for sustained heart rate greater than 130 for 15 minutes and if SBP greater than 110.  Outpatient follow-up with cardiology, Dr. Saralyn Pilar or Leroy Libman PA-c in one week.  If continues to be symptomatic, consider outpatient follow-up with electrophysiology.  Elevated troponin secondary to type II demand ischemia Hx CAD s/p PCI with DES to RPL 11/2020 Troponin elevated on admission at 60, 107, 100, 87 in the absence of ischemic changes on EKG.  Most consistent with demand/supply supply mismatch and not ACS.  Seen by cardiology.  Continue aspirin 81 mg p.o. daily, atorvastatin 80 mg p.o. daily, valsartan.  Outpatient follow-up with cardiology.  Dyslipidemia Continue atorvastatin 80 mg p.o. daily  CKD stage IIIa Creatinine 1.19, stable  Hypothyroidism TSH 1.660 on 02/20/2022.  Continue levothyroxine.   Discharge Diagnoses:  Principal Problem:   Chest pain Active Problems:   Elevated troponin   Dyslipidemia   Hypothyroidism   Palpitations   Chronic kidney disease (CKD), stage III (moderate) ([place])    Discharge Instructions  Discharge Instructions     Call MD for:  difficulty breathing, headache or visual disturbances   Complete by: As directed    Call MD for:  extreme fatigue   Complete by: As directed  Call MD for:  persistant dizziness or light-headedness   Complete by: As directed    Call MD for:  persistant nausea and vomiting   Complete by: As directed    Call MD for:  severe uncontrolled pain   Complete by: As directed    Call MD for:  temperature >100.4    Complete by: As directed    Diet - low sodium heart healthy   Complete by: As directed    Increase activity slowly   Complete by: As directed       Allergies as of 06/26/2022   No Known Allergies      Medication List     TAKE these medications    aspirin 81 MG tablet Take 81 mg by mouth every evening.   atorvastatin 80 MG tablet Commonly known as: LIPITOR Take 1 tablet by mouth daily at 12 noon.   levothyroxine 75 MCG tablet Commonly known as: SYNTHROID Take 1 tablet (75 mcg total) by mouth daily.   metoprolol tartrate 25 MG tablet Commonly known as: LOPRESSOR Take 1 tablet (25 mg total) by mouth every 6 (six) hours as needed (for heart rate sustained >130 for 15 minutes and if systolic blood pressure (top number) is >110).   valsartan 80 MG tablet Commonly known as: DIOVAN Take 40 mg by mouth at bedtime.        Follow-up Information     Paraschos, Alexander, MD. Go in 1 week(s).   Specialty: Cardiology Contact information: [facility] [place] [address] [phone]                No Known Allergies  Consultations: Cardiology   Procedures/Studies: DG Chest 2 View  Result Date: 06/25/2022 CLINICAL DATA:  Palpitations. EXAM: CHEST - 2 VIEW COMPARISON:  Chest x-ray 03/19/2021. FINDINGS: The heart size and mediastinal contours are within normal limits. Both lungs are clear. No visible pleural effusions or pneumothorax. No acute osseous abnormality. IMPRESSION: No active cardiopulmonary disease. Electronically Signed   By: Margaretha Sheffield M.D.   On: 06/25/2022 15:45     Subjective: Patient seen examined bedside, resting calmly.  No specific complaints this morning.  Seen by cardiology with recommendations of as needed metoprolol and outpatient with cardiology 1 week.  Discussed with spouse present at bedside.  Ready for discharge home.  No other questions or concerns at this time.  Denies headache, no  dizziness, no chest pain, no current palpitations, no shortness of breath, no fever/chills/night sweats, no nausea/vomiting/diarrhea, no focal weakness, no fatigue, no paresthesias.  No acute events overnight per nurse staff.  Discharge Exam: Vitals:   06/26/22 1000 06/26/22 1128  BP: (!) 176/107   Pulse: 62   Resp: 20   Temp:  97.7 F (36.5 C)  SpO2: 100%    Vitals:   06/26/22 0800 06/26/22 0900 06/26/22 1000 06/26/22 1128  BP: (!) 166/111 (!) 153/59 (!) 176/107   Pulse: (!) 55 (!) 56 62   Resp: '16 16 20   '$ Temp:    97.7 F (36.5 C)  TempSrc:    Oral  SpO2: 97% 97% 100%   Weight:      Height:        Physical Exam: GEN: NAD, alert and oriented x 3, wd/wn HEENT: NCAT, PERRL, EOMI, sclera clear, MMM PULM: CTAB w/o wheezes/crackles, normal respiratory effort, on room air CV: RRR w/o M/G/R GI: abd soft, NTND, NABS, no R/G/M MSK: no peripheral edema, muscle strength globally intact 5/5  bilateral upper/lower extremities NEURO: CN II-XII intact, no focal deficits, sensation to light touch intact PSYCH: normal mood/affect Integumentary: dry/intact, no rashes or wounds    The results of significant diagnostics from this hospitalization (including imaging, microbiology, ancillary and laboratory) are listed below for reference.     Microbiology: No results found for this or any previous visit (from the past 240 hour(s)).   Labs: BNP (last 3 results) No results for input(s): "BNP" in the last 8760 hours. Basic Metabolic Panel: Recent Labs  Lab 06/25/22 1522 06/26/22 0522  NA 139 141  K 3.9 3.8  CL 107 112*  CO2 23 22  GLUCOSE 97 97  BUN 27* 23  CREATININE 1.43* 1.19  CALCIUM 8.6* 8.4*  MG  --  2.2   Liver Function Tests: No results for input(s): "AST", "ALT", "ALKPHOS", "BILITOT", "PROT", "ALBUMIN" in the last 168 hours. No results for input(s): "LIPASE", "AMYLASE" in the last 168 hours. No results for input(s): "AMMONIA" in the last 168 hours. CBC: Recent Labs   Lab 06/25/22 1522 06/26/22 0522  WBC 9.7 6.3  HGB 14.6 13.9  HCT 45.3 43.6  MCV 81.6 81.3  PLT 307 279   Cardiac Enzymes: No results for input(s): "CKTOTAL", "CKMB", "CKMBINDEX", "TROPONINI" in the last 168 hours. BNP: Invalid input(s): "POCBNP" CBG: No results for input(s): "GLUCAP" in the last 168 hours. D-Dimer No results for input(s): "DDIMER" in the last 72 hours. Hgb A1c No results for input(s): "HGBA1C" in the last 72 hours. Lipid Profile No results for input(s): "CHOL", "HDL", "LDLCALC", "TRIG", "CHOLHDL", "LDLDIRECT" in the last 72 hours. Thyroid function studies No results for input(s): "TSH", "T4TOTAL", "T3FREE", "THYROIDAB" in the last 72 hours.  Invalid input(s): "FREET3" Anemia work up No results for input(s): "VITAMINB12", "FOLATE", "FERRITIN", "TIBC", "IRON", "RETICCTPCT" in the last 72 hours. Urinalysis    Component Value Date/Time   COLORURINE YELLOW (A) 05/10/2018 1540   APPEARANCEUR CLEAR (A) 05/10/2018 1540   APPEARANCEUR Clear 02/23/2018 1418   LABSPEC 1.017 05/10/2018 1540   PHURINE 5.0 05/10/2018 1540   GLUCOSEU 150 (A) 05/10/2018 1540   HGBUR NEGATIVE 05/10/2018 1540   BILIRUBINUR NEGATIVE 05/10/2018 1540   BILIRUBINUR Negative 02/23/2018 1418   KETONESUR NEGATIVE 05/10/2018 1540   PROTEINUR NEGATIVE 05/10/2018 1540   NITRITE NEGATIVE 05/10/2018 1540   LEUKOCYTESUR NEGATIVE 05/10/2018 1540   LEUKOCYTESUR Negative 02/23/2018 1418   Sepsis Labs Recent Labs  Lab 06/25/22 1522 06/26/22 0522  WBC 9.7 6.3   Microbiology No results found for this or any previous visit (from the past 240 hour(s)).   Time coordinating discharge: Over 30 minutes  SIGNED:   Vercie Pokorny J [place] DO  [facility] 06/26/2022, 12:30 PM

## 2022-06-26 NOTE — Assessment & Plan Note (Signed)
-   The patient has stage IIIa chronic kidney disease that is stable. - Will place on hydration with IV normal saline this year. - Will follow BMP.

## 2022-08-11 DIAGNOSIS — I10 Essential (primary) hypertension: Secondary | ICD-10-CM | POA: Diagnosis not present

## 2022-08-11 DIAGNOSIS — R002 Palpitations: Secondary | ICD-10-CM | POA: Diagnosis not present

## 2022-08-11 DIAGNOSIS — I471 Supraventricular tachycardia, unspecified: Secondary | ICD-10-CM | POA: Diagnosis not present

## 2022-08-11 DIAGNOSIS — I2510 Atherosclerotic heart disease of native coronary artery without angina pectoris: Secondary | ICD-10-CM | POA: Diagnosis not present

## 2022-08-11 DIAGNOSIS — E782 Mixed hyperlipidemia: Secondary | ICD-10-CM | POA: Diagnosis not present

## 2022-08-24 NOTE — Patient Instructions (Signed)
Be Involved in [place]:  Taking Medications When medications are taken as directed, they can greatly improve your health. But if they are not taken as instructed, they may not work. In some cases, not taking them correctly can be harmful. To help ensure your treatment remains effective and safe, understand your medications and how to take them.  Your lab results, notes and after visit summary will be available on My Chart. We strongly encourage you to use this feature. If lab results are abnormal the clinic will contact you with the appropriate steps. If the clinic does not contact you assume the results are satisfactory. You can always see your results on My Chart. If you have questions regarding your condition, please contact the clinic during office hours. You can also ask questions on My Chart.  We at [facility] are grateful that you chose [place] to provide care. We strive to provide excellent and compassionate care and are always looking for feedback. If you get a survey from the clinic please complete this.

## 2022-08-28 ENCOUNTER — Ambulatory Visit: Payer: Medicare Other | Admitting: Nurse Practitioner

## 2022-08-29 ENCOUNTER — Encounter: Payer: Self-pay | Admitting: Nurse Practitioner

## 2022-08-29 ENCOUNTER — Ambulatory Visit (INDEPENDENT_AMBULATORY_CARE_PROVIDER_SITE_OTHER): Payer: Medicare Other | Admitting: Nurse Practitioner

## 2022-08-29 VITALS — BP 130/68 | HR 63 | Temp 97.7°F | Ht 67.24 in | Wt 146.2 lb

## 2022-08-29 DIAGNOSIS — N1831 Chronic kidney disease, stage 3a: Secondary | ICD-10-CM | POA: Diagnosis not present

## 2022-08-29 DIAGNOSIS — I471 Supraventricular tachycardia, unspecified: Secondary | ICD-10-CM

## 2022-08-29 DIAGNOSIS — E782 Mixed hyperlipidemia: Secondary | ICD-10-CM

## 2022-08-29 DIAGNOSIS — I10 Essential (primary) hypertension: Secondary | ICD-10-CM

## 2022-08-29 DIAGNOSIS — I252 Old myocardial infarction: Secondary | ICD-10-CM

## 2022-08-29 DIAGNOSIS — D631 Anemia in chronic kidney disease: Secondary | ICD-10-CM

## 2022-08-29 DIAGNOSIS — R7301 Impaired fasting glucose: Secondary | ICD-10-CM

## 2022-08-29 DIAGNOSIS — I2510 Atherosclerotic heart disease of native coronary artery without angina pectoris: Secondary | ICD-10-CM | POA: Diagnosis not present

## 2022-08-29 DIAGNOSIS — E039 Hypothyroidism, unspecified: Secondary | ICD-10-CM

## 2022-08-29 LAB — MICROALBUMIN, URINE WAIVED
Creatinine, Urine Waived: 50 mg/dL (ref 10–300)
Microalb, Ur Waived: 80 mg/L — ABNORMAL HIGH (ref 0–19)

## 2022-08-29 LAB — BAYER DCA HB A1C WAIVED: HB A1C (BAYER DCA - WAIVED): 5.9 % — ABNORMAL HIGH (ref 4.8–5.6)

## 2022-08-29 MED ORDER — LEVOTHYROXINE SODIUM 75 MCG PO TABS: 75.0000 ug | ORAL_TABLET | Freq: Every day | ORAL | 4 refills | Status: DC

## 2022-08-29 NOTE — Assessment & Plan Note (Signed)
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  Continue collaboration with cardiology and current medication regimen as prescribed by them.  Lipid panel today.

## 2022-08-29 NOTE — Assessment & Plan Note (Signed)
A1c trend up 6.1% last visit, recommend continue diet focus and plan to recheck A1c today and every 6 months.

## 2022-08-29 NOTE — Assessment & Plan Note (Signed)
Chronic, ongoing.  Followed by nephrology, continue this collaboration.  Recent notes reviewed.  Labs up to date.

## 2022-08-29 NOTE — Progress Notes (Signed)
BP 130/68 (BP Location: Left Arm, Patient Position: Sitting, Cuff Size: Normal)   Pulse 63   Temp 97.7 F (36.5 C) (Oral)   Ht 5' 7.24" (1.708 m)   Wt 146 lb 3.2 oz (66.3 kg)   SpO2 99%   BMI 22.73 kg/m    Subjective:    Patient ID: Roger Byrd, male    DOB: 1940-08-14, 82 y.o.   MRN: [id number]  HPI: Roger Byrd is a 82 y.o. male  Chief Complaint  Patient presents with   Chronic Kidney Disease   SVT   IFG   Coronary Artery Disease     HYPERTENSION / HYPERLIPIDEMIA Taking Valsartan 40 MG daily, ASA, Amiodarone, Plavix, Atorvastatin.  Had NSTEMI on 12/25/20 (inferior myocardial infarction), admitted to hospital at time and left heart cath performed with stent placement.    Saw cardiology on 08/11/22 -- was told to reduce his caffeine and increase calcium intake -- has Metoprolol as needed for elevated HR.  Had stress test and echo on 01/30/2022 = EF 61% with normal LV function and mild LVH.     A1c mild elevation at 6.1% August 2023.  Satisfied with current treatment? yes Duration of hypertension: chronic BP monitoring frequency: a few times a week BP range: average 130/70-80 BP medication side effects: no Duration of hyperlipidemia: chronic Cholesterol medication side effects: no Cholesterol supplements: none Medication compliance: good compliance Aspirin: yes Recent stressors: no Recurrent headaches: no Visual changes: no Palpitations: no Dyspnea: no Chest pain: no Lower extremity edema: no Dizzy/lightheaded: no  The ASCVD Risk score (Arnett DK, et al., 2019) failed to calculate for the following reasons:   The 2019 ASCVD risk score is only valid for ages 27 to 8   The patient has a prior MI or stroke diagnosis  CHRONIC KIDNEY DISEASE Followed by nephrology, saw them last 05/06/22. Sees every 6 months.  Labs at time-- CRT 1.29 and GFR 56, PTH 63. Hematology for anemia with CKD, is currently off iron tablets -- last saw 02/06/21 -- to follow-up as needed  only. CKD status: stable Medications renally dose: yes Previous renal evaluation: yes Pneumovax:  Up to Date Influenza Vaccine:  Up to Date   HYPOTHYROIDISM Continues on Levothyroxine 75 MCG. Thyroid control status:stable Satisfied with current treatment? yes Medication side effects: no Medication compliance: good compliance Etiology of hypothyroidism:  Recent dose adjustment:no Fatigue: no Cold intolerance: no Heat intolerance: no Weight gain: no Weight loss: no Constipation: no Diarrhea/loose stools: no Palpitations: no Lower extremity edema: no Anxiety/depressed mood: no   Relevant past medical, surgical, family and social history reviewed and updated as indicated. Interim medical history since our last visit reviewed. Allergies and medications reviewed and updated.  Review of Systems  Constitutional:  Negative for activity change, diaphoresis, fatigue and fever.  Respiratory:  Negative for cough, chest tightness, shortness of breath and wheezing.   Cardiovascular:  Negative for chest pain, palpitations and leg swelling.  Gastrointestinal: Negative.   Endocrine: Negative for cold intolerance, heat intolerance, polydipsia, polyphagia and polyuria.  Skin: Negative.   Psychiatric/Behavioral: Negative.      Per HPI unless specifically indicated above     Objective:    BP 130/68 (BP Location: Left Arm, Patient Position: Sitting, Cuff Size: Normal)   Pulse 63   Temp 97.7 F (36.5 C) (Oral)   Ht 5' 7.24" (1.708 m)   Wt 146 lb 3.2 oz (66.3 kg)   SpO2 99%   BMI 22.73 kg/m   Wt  Readings from Last 3 Encounters:  08/29/22 146 lb 3.2 oz (66.3 kg)  06/25/22 142 lb (64.4 kg)  02/20/22 150 lb 12.8 oz (68.4 kg)    Physical Exam Vitals and nursing note reviewed.  Constitutional:      General: He is awake. He is not in acute distress.    Appearance: He is well-developed and well-groomed. He is not ill-appearing or toxic-appearing.  HENT:     Head: Normocephalic and  atraumatic.     Right Ear: Hearing, tympanic membrane, ear canal and external ear normal. No drainage.     Left Ear: Hearing, tympanic membrane, ear canal and external ear normal. No drainage.  Eyes:     General: Lids are normal.        Right eye: No discharge.        Left eye: No discharge.     Conjunctiva/sclera: Conjunctivae normal.     Pupils: Pupils are equal, round, and reactive to light.  Neck:     Thyroid: No thyromegaly.     Vascular: No carotid bruit.     Trachea: Trachea normal.  Cardiovascular:     Rate and Rhythm: Normal rate and regular rhythm.     Heart sounds: Normal heart sounds, S1 normal and S2 normal. No murmur heard.    No gallop.  Pulmonary:     Effort: Pulmonary effort is normal. No accessory muscle usage or respiratory distress.     Breath sounds: Normal breath sounds.  Abdominal:     General: Bowel sounds are normal.     Palpations: Abdomen is soft. There is no hepatomegaly or splenomegaly.  Musculoskeletal:        General: Normal range of motion.     Cervical back: Normal range of motion and neck supple.     Right lower leg: No edema.     Left lower leg: No edema.  Skin:    General: Skin is warm and dry.     Capillary Refill: Capillary refill takes less than 2 seconds.     Findings: No rash.  Neurological:     Mental Status: He is alert and oriented to person, place, and time.     Deep Tendon Reflexes: Reflexes are normal and symmetric.  Psychiatric:        Attention and Perception: Attention normal.        Mood and Affect: Mood normal.        Speech: Speech normal.        Behavior: Behavior normal. Behavior is cooperative.        Thought Content: Thought content normal.     Results for orders placed or performed during the hospital encounter of [id number]  Basic metabolic panel  Result Value Ref Range   Sodium 139 135 - 145 mmol/L   Potassium 3.9 3.5 - 5.1 mmol/L   Chloride 107 98 - 111 mmol/L   CO2 23 22 - 32 mmol/L   Glucose, Bld 97 70 -  99 mg/dL   BUN 27 (H) 8 - 23 mg/dL   Creatinine, Ser 1.43 (H) 0.61 - 1.24 mg/dL   Calcium 8.6 (L) 8.9 - 10.3 mg/dL   GFR, Estimated 49 (L) >60 mL/min   Anion gap 9 5 - 15  CBC  Result Value Ref Range   WBC 9.7 4.0 - 10.5 K/uL   RBC 5.55 4.22 - 5.81 MIL/uL   Hemoglobin 14.6 13.0 - 17.0 g/dL   HCT 45.3 39.0 - 52.0 %   MCV 81.6 80.0 -  100.0 fL   MCH 26.3 26.0 - 34.0 pg   MCHC 32.2 30.0 - 36.0 g/dL   RDW 13.9 11.5 - 15.5 %   Platelets 307 150 - 400 K/uL   nRBC 0.0 0.0 - 0.2 %  Basic metabolic panel  Result Value Ref Range   Sodium 141 135 - 145 mmol/L   Potassium 3.8 3.5 - 5.1 mmol/L   Chloride 112 (H) 98 - 111 mmol/L   CO2 22 22 - 32 mmol/L   Glucose, Bld 97 70 - 99 mg/dL   BUN 23 8 - 23 mg/dL   Creatinine, Ser 1.19 0.61 - 1.24 mg/dL   Calcium 8.4 (L) 8.9 - 10.3 mg/dL   GFR, Estimated >60 >60 mL/min   Anion gap 7 5 - 15  CBC  Result Value Ref Range   WBC 6.3 4.0 - 10.5 K/uL   RBC 5.36 4.22 - 5.81 MIL/uL   Hemoglobin 13.9 13.0 - 17.0 g/dL   HCT 43.6 39.0 - 52.0 %   MCV 81.3 80.0 - 100.0 fL   MCH 25.9 (L) 26.0 - 34.0 pg   MCHC 31.9 30.0 - 36.0 g/dL   RDW 14.0 11.5 - 15.5 %   Platelets 279 150 - 400 K/uL   nRBC 0.0 0.0 - 0.2 %  Magnesium  Result Value Ref Range   Magnesium 2.2 1.7 - 2.4 mg/dL  Troponin I (High Sensitivity)  Result Value Ref Range   Troponin I (High Sensitivity) 60 (H) <18 ng/L  Troponin I (High Sensitivity)  Result Value Ref Range   Troponin I (High Sensitivity) 107 (HH) <18 ng/L  Troponin I (High Sensitivity)  Result Value Ref Range   Troponin I (High Sensitivity) 100 (HH) <18 ng/L  Troponin I (High Sensitivity)  Result Value Ref Range   Troponin I (High Sensitivity) 87 (H) <18 ng/L      Assessment & Plan:   Problem List Items Addressed This Visit       Cardiovascular and Mediastinum   Coronary artery disease involving native coronary artery of native heart    Chronic, stable.  NSTEMI, (inferior myocardial infarction), on 12/25/20 with  heart cath.  At this time continue current medication regimen and collaboration with cardiology.  Lipid panel today.      Hypertension    Chronic, ongoing with initial BP elevated, but repeat at goal and home readings consistently at goal + occasional low readings reported.  Continue current medication regimen and adjust as needed. Recommend continue to monitor BP at home regularly and document for provider + focus on DASH diet.  Labs: CBC.  Return in 6 months, continue to collaborate with cardiology.      Relevant Orders   CBC with Differential/Platelet   SVT (supraventricular tachycardia) - Primary    Chronic, stable at this time with no symptoms.  Continue collaboration with cardiology.  Recent notes reviewed.  Recommend continue no caffeine use and increased calcium.      Relevant Orders   Lipid Panel w/o Chol/HDL Ratio     Endocrine   Hypothyroidism    Chronic, ongoing.  At this time continue current Levothyroxine dosing and adjust as needed based on labs.  Labs today.  Previously took Amiodarone.      Relevant Medications   levothyroxine (SYNTHROID) 75 MCG tablet   Other Relevant Orders   TSH   T4, free   IFG (impaired fasting glucose)    A1c trend up 6.1% last visit, recommend continue diet focus and plan to  recheck A1c today and every 6 months.      Relevant Orders   Bayer DCA Hb A1c Waived   Microalbumin, Urine Waived     Genitourinary   Anemia due to stage 3a chronic kidney disease ([place])    Chronic, stable at this time with improved labs in August 2022.  Continue collaboration with hematology as needed only and nephrology as scheduled + current medication regimen.        Relevant Orders   CBC with Differential/Platelet   Chronic kidney disease (CKD), stage III (moderate) ([facility])    Chronic, ongoing.  Followed by nephrology, continue this collaboration.  Recent notes reviewed.  Labs up to date.      Relevant Orders   Microalbumin, Urine Waived   CBC with  Differential/Platelet     Other   History of non-ST elevation myocardial infarction (NSTEMI)    On 12/25/20, (inferior myocardial infarction).  Continue collaboration with cardiology and current medication regimen as prescribed by them.  Lipid panel today.      Hyperlipidemia    Chronic, ongoing.  Continue current medication regimen and adjust as needed.  Lipid panel today.      Relevant Orders   Lipid Panel w/o Chol/HDL Ratio     Follow up plan: Return in about 6 months (around 03/01/2023) for Annual physical.

## 2022-08-29 NOTE — Assessment & Plan Note (Signed)
Chronic, ongoing with initial BP elevated, but repeat at goal and home readings consistently at goal + occasional low readings reported.  Continue current medication regimen and adjust as needed. Recommend continue to monitor BP at home regularly and document for provider + focus on DASH diet.  Labs: CBC.  Return in 6 months, continue to collaborate with cardiology.

## 2022-08-29 NOTE — Assessment & Plan Note (Signed)
Chronic, stable at this time with improved labs in August 2022.  Continue collaboration with hematology as needed only and nephrology as scheduled + current medication regimen.

## 2022-08-29 NOTE — Assessment & Plan Note (Signed)
Chronic, ongoing.  At this time continue current Levothyroxine dosing and adjust as needed based on labs.  Labs today.  Previously took Amiodarone.

## 2022-08-29 NOTE — Assessment & Plan Note (Signed)
Chronic, stable at this time with no symptoms.  Continue collaboration with cardiology.  Recent notes reviewed.  Recommend continue no caffeine use and increased calcium.

## 2022-08-29 NOTE — Assessment & Plan Note (Signed)
Chronic, stable.  NSTEMI, (inferior myocardial infarction), on 12/25/20 with heart cath.  At this time continue current medication regimen and collaboration with cardiology.  Lipid panel today.

## 2022-08-29 NOTE — Assessment & Plan Note (Signed)
Chronic, ongoing. Continue current medication regimen and adjust as needed.  Lipid panel today. 

## 2022-08-30 LAB — LIPID PANEL W/O CHOL/HDL RATIO
Cholesterol, Total: 139 mg/dL (ref 100–199)
HDL: 46 mg/dL (ref >39)
LDL Chol Calc (NIH): 66 mg/dL (ref 0–99)
Triglycerides: 156 mg/dL — ABNORMAL HIGH (ref 0–149)
VLDL Cholesterol Cal: 27 mg/dL (ref 5–40)

## 2022-08-30 LAB — CBC WITH DIFFERENTIAL/PLATELET
Basophils Absolute: 0 10*3/uL (ref 0.0–0.2)
Basos: 1 %
EOS (ABSOLUTE): 0.3 10*3/uL (ref 0.0–0.4)
Eos: 5 %
Hematocrit: 42.3 % (ref 37.5–51.0)
Hemoglobin: 13.9 g/dL (ref 13.0–17.7)
Immature Grans (Abs): 0 10*3/uL (ref 0.0–0.1)
Immature Granulocytes: 0 %
Lymphocytes Absolute: 1.7 10*3/uL (ref 0.7–3.1)
Lymphs: 29 %
MCH: 25.7 pg — ABNORMAL LOW (ref 26.6–33.0)
MCHC: 32.9 g/dL (ref 31.5–35.7)
MCV: 78 fL — ABNORMAL LOW (ref 79–97)
Monocytes Absolute: 0.5 10*3/uL (ref 0.1–0.9)
Monocytes: 8 %
Neutrophils Absolute: 3.4 10*3/uL (ref 1.4–7.0)
Neutrophils: 57 %
Platelets: 301 10*3/uL (ref 150–450)
RBC: 5.41 x10E6/uL (ref 4.14–5.80)
RDW: 12.6 % (ref 11.6–15.4)
WBC: 5.9 10*3/uL (ref 3.4–10.8)

## 2022-08-30 LAB — TSH: TSH: 1.87 u[IU]/mL (ref 0.450–4.500)

## 2022-08-30 LAB — T4, FREE: Free T4: 1.31 ng/dL (ref 0.82–1.77)

## 2022-08-30 NOTE — Progress Notes (Signed)
Contacted via [place] morning Roger Byrd, your labs have returned and overall these are stable.  No medication changes needed.  Continue all current medications:) Any questions? Keep being amazing!!  Thank you for allowing me to participate in your care.  I appreciate you. Kindest regards, Deatra Mcmahen

## 2022-12-04 ENCOUNTER — Telehealth: Payer: Self-pay | Admitting: Nurse Practitioner

## 2022-12-04 NOTE — Telephone Encounter (Signed)
Copied from CRM [id number]. Topic: Medicare AWV >> Dec 04, 2022 11:04 AM Payton Doughty wrote: Reason for CRM: LM 12/04/2022 to schedule AWV   Verlee Rossetti; Care Guide Ambulatory Clinical Support [facility] Direct Dial: [phone]

## 2022-12-25 ENCOUNTER — Telehealth: Payer: Self-pay | Admitting: Nurse Practitioner

## 2022-12-25 NOTE — Telephone Encounter (Signed)
Copied from CRM [id number] [id number]. Topic: Medicare AWV >> Dec 25, 2022 10:30 AM Payton Doughty wrote: Reason for CRM: LM 12/25/2022 to schedule AWV   Verlee Rossetti; Care Guide Ambulatory Clinical Support [place] [facility] Direct Dial: [phone]

## 2023-01-04 IMAGING — CR DG CHEST 2V
1 series · 2 of 2 positions shown · non-contrast
Comparison: Chest x-ray 09/15/2017.

CLINICAL DATA: chest tightness that started today with shob. Denies
n/v. Hx of stent in [REDACTED]. Tachy in triage

EXAM:
CHEST - 2 VIEW

[Series 1: w chest pa · 0.14mm/px · 2 of 2 slices shown]
[im 1/2]
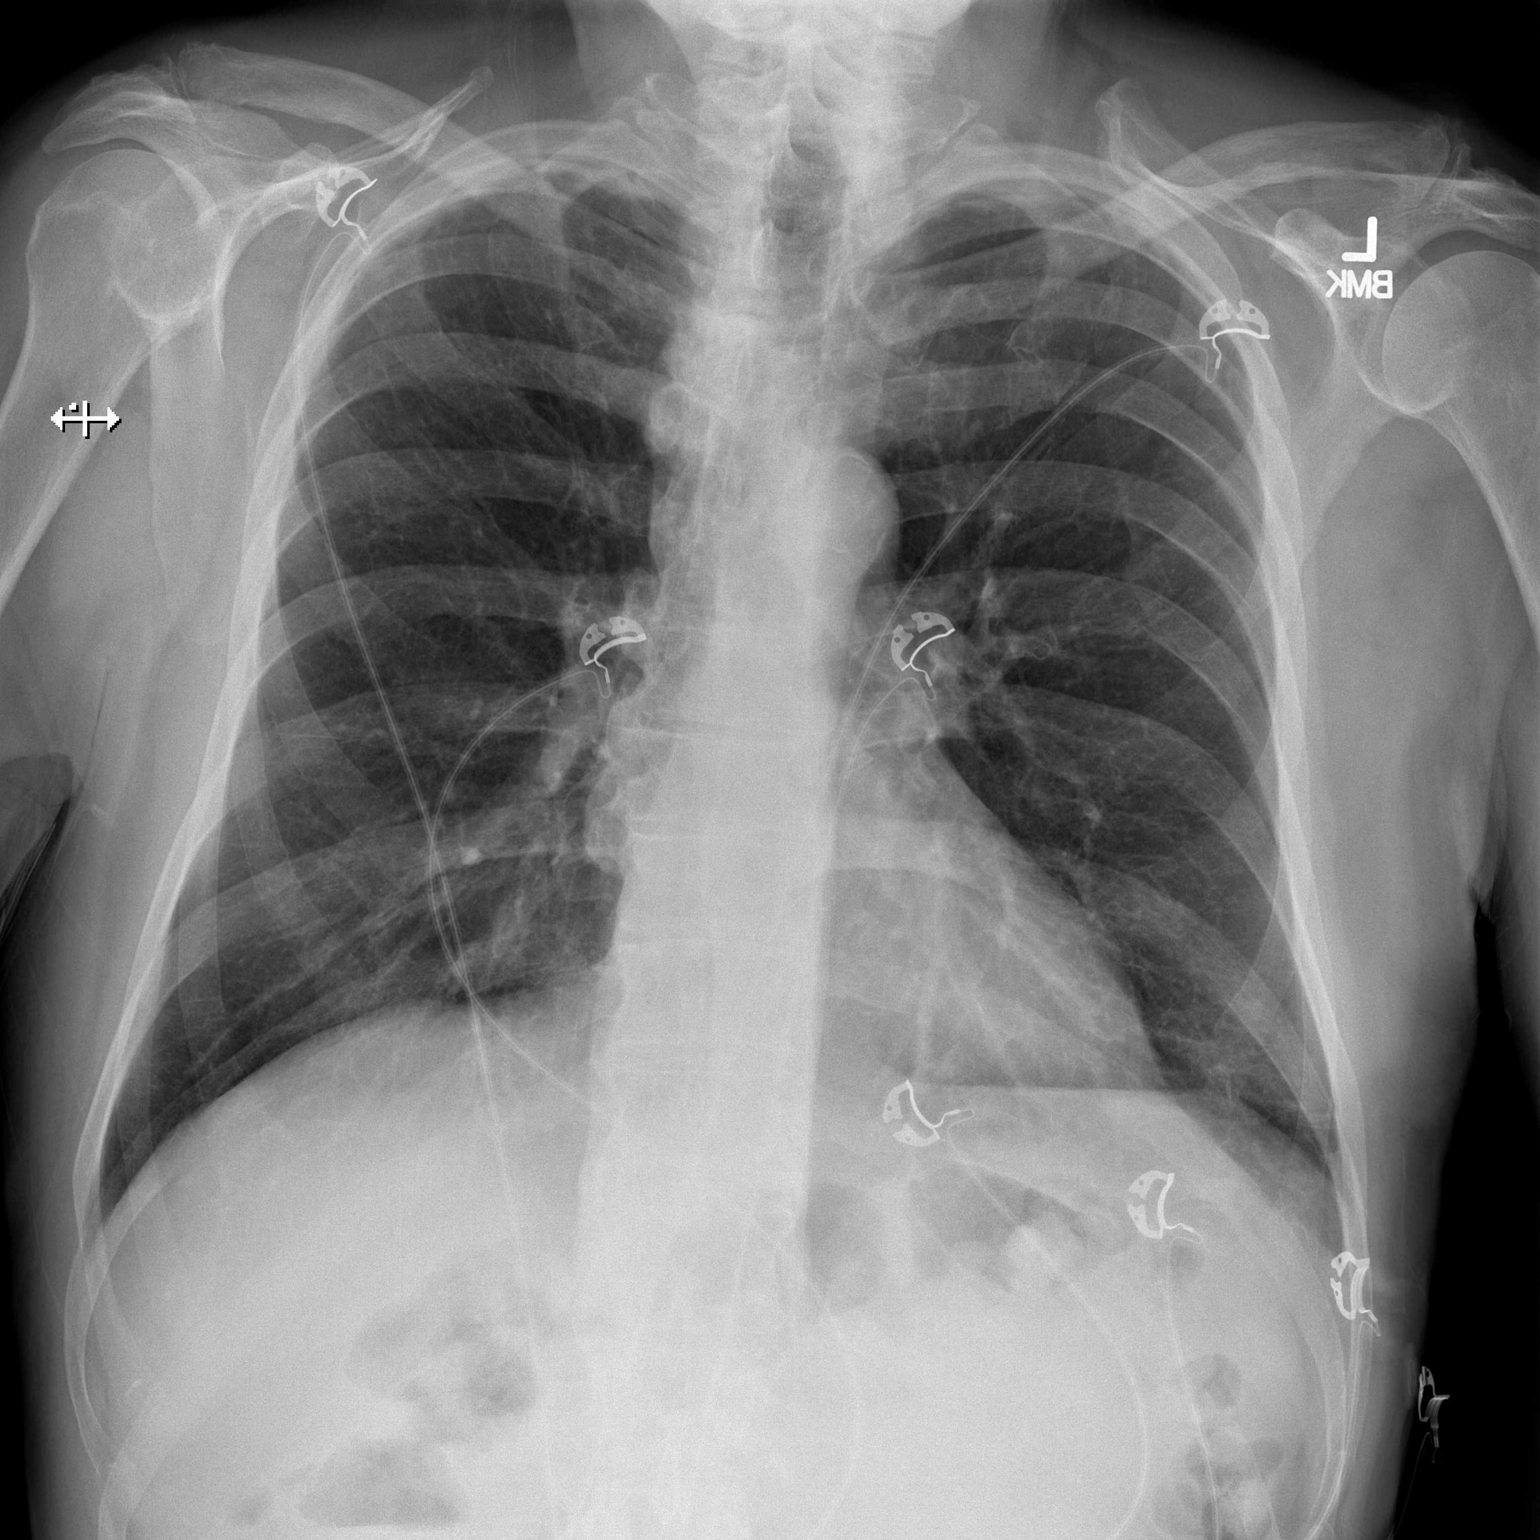
[im 2/2]
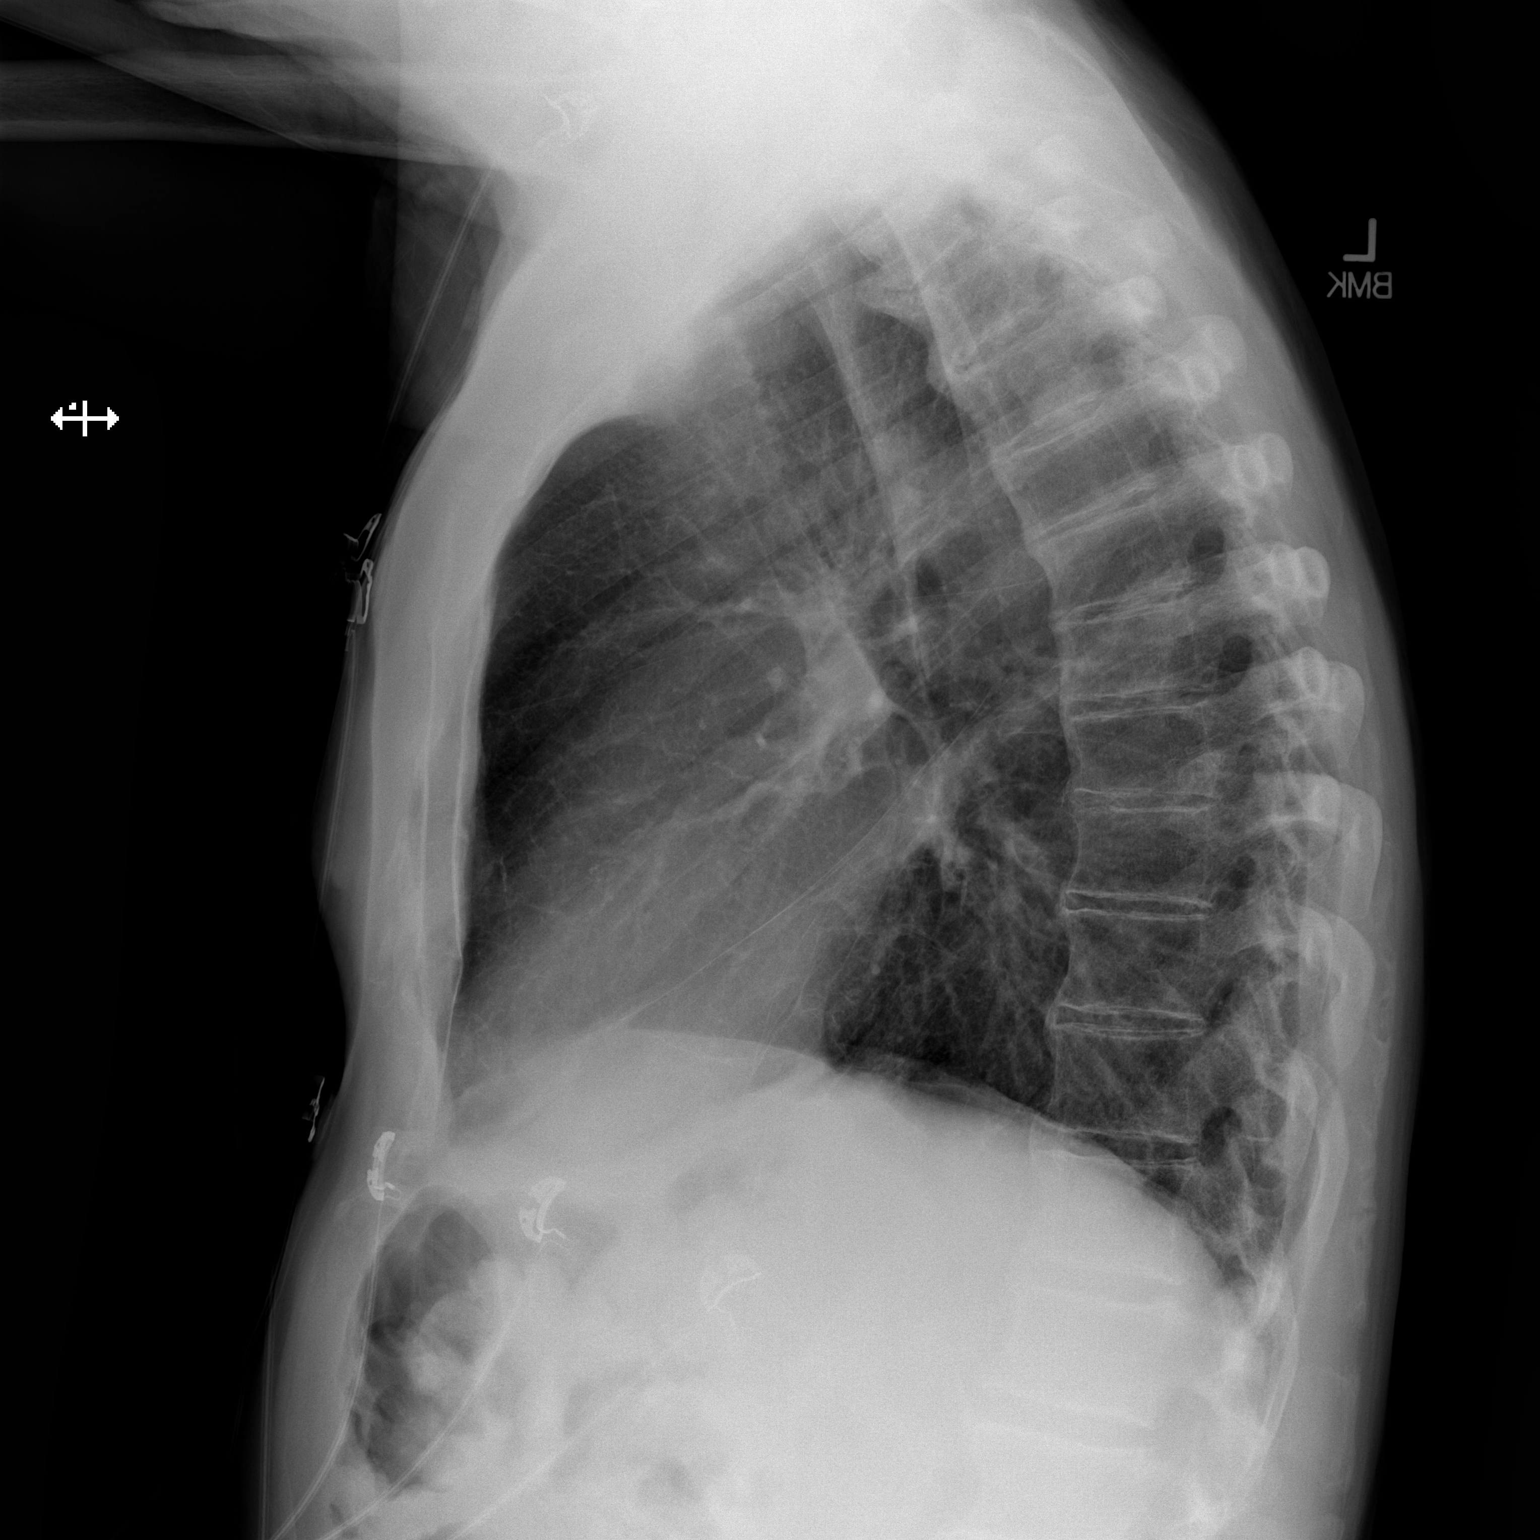

[2 of 2 positions shown; findings below may reference images not displayed]

FINDINGS: The heart and mediastinal contours are unchanged. Aortic
calcification.

Biapical pleural/pulmonary scarring. No focal consolidation. No
pulmonary edema. No pleural effusion. No pneumothorax.

No acute osseous abnormality.
IMPRESSION: 1. No active cardiopulmonary disease.
2.  Aortic Atherosclerosis ([id number]).

## 2023-01-13 DIAGNOSIS — N1831 Chronic kidney disease, stage 3a: Secondary | ICD-10-CM | POA: Diagnosis not present

## 2023-01-13 DIAGNOSIS — R809 Proteinuria, unspecified: Secondary | ICD-10-CM | POA: Diagnosis not present

## 2023-01-13 DIAGNOSIS — I10 Essential (primary) hypertension: Secondary | ICD-10-CM | POA: Diagnosis not present

## 2023-02-28 NOTE — Patient Instructions (Signed)
 Be Involved in Caring For Your Health:  Taking Medications When medications are taken as directed, they can greatly improve your health. But if they are not taken as prescribed, they may not work. In some cases, not taking them correctly can be harmful. To help ensure your treatment remains effective and safe, understand your medications and how to take them. Bring your medications to each visit for review by your provider.  Your lab results, notes, and after visit summary will be available on My Chart. We strongly encourage you to use this feature. If lab results are abnormal the clinic will contact you with the appropriate steps. If the clinic does not contact you assume the results are satisfactory. You can always view your results on My Chart. If you have questions regarding your health or results, please contact the clinic during office hours. You can also ask questions on My Chart.  We at [facility] are grateful that you chose [place] to provide your care. We strive to provide evidence-based and compassionate care and are always looking for feedback. If you get a survey from the clinic please complete this so we can hear your opinions.  Hypothyroidism  Hypothyroidism is when the thyroid gland does not make enough of certain hormones. This is called an underactive thyroid. The thyroid gland is a small gland located in the lower front part of the neck, just in front of the windpipe (trachea). This gland makes hormones that help control how the body uses food for energy (metabolism) as well as how the heart and brain function. These hormones also play a role in keeping your bones strong. When the thyroid is underactive, it produces too little of the hormones thyroxine (T4) and triiodothyronine (T3). What are the causes? This condition may be caused by: Hashimoto's disease. This is a disease in which the body's disease-fighting system (immune system) attacks the thyroid gland. This is the  most common cause. Viral infections. Pregnancy. Certain medicines. Birth defects. Problems with a gland in the center of the brain (pituitary gland). Lack of enough iodine in the diet. Other causes may include: Past radiation treatments to the head or neck for cancer. Past treatment with radioactive iodine. Past exposure to radiation in the environment. Past surgical removal of part or all of the thyroid. What increases the risk? You are more likely to develop this condition if: You are male. You have a family history of thyroid conditions. You use a medicine called lithium. You take medicines that affect the immune system (immunosuppressants). What are the signs or symptoms? Common symptoms of this condition include: Not being able to tolerate cold. Feeling as though you have no energy (lethargy). Lack of appetite. Constipation. Sadness or depression. Weight gain that is not explained by a change in diet or exercise habits. Menstrual irregularity. Dry skin, coarse hair, or brittle nails. Other symptoms may include: Muscle pain. Slowing of thought processes. Poor memory. How is this diagnosed? This condition may be diagnosed based on: Your symptoms, your medical history, and a physical exam. Blood tests. You may also have imaging tests, such as an ultrasound or MRI. How is this treated? This condition is treated with medicine that replaces the thyroid hormones that your body does not make. After you begin treatment, it may take several weeks for symptoms to go away. Follow these instructions at home: Take over-the-counter and prescription medicines only as told by your health care provider. If you start taking any new medicines, tell your health care  provider. Keep all follow-up visits as told by your health care provider. This is important. As your condition improves, your dosage of thyroid hormone medicine may change. You will need to have blood tests regularly so that  your health care provider can monitor your condition. Contact a health care provider if: Your symptoms do not get better with treatment. You are taking thyroid hormone replacement medicine and you: Sweat a lot. Have tremors. Feel anxious. Lose weight rapidly. Cannot tolerate heat. Have emotional swings. Have diarrhea. Feel weak. Get help right away if: You have chest pain. You have an irregular heartbeat. You have a rapid heartbeat. You have difficulty breathing. These symptoms may be an emergency. Get help right away. Call 911. Do not wait to see if the symptoms will go away. Do not drive yourself to the hospital. Summary Hypothyroidism is when the thyroid gland does not make enough of certain hormones (it is underactive). When the thyroid is underactive, it produces too little of the hormones thyroxine (T4) and triiodothyronine (T3). The most common cause is Hashimoto's disease, a disease in which the body's disease-fighting system (immune system) attacks the thyroid gland. The condition can also be caused by viral infections, medicine, pregnancy, or past radiation treatment to the head or neck. Symptoms may include weight gain, dry skin, constipation, feeling as though you do not have energy, and not being able to tolerate cold. This condition is treated with medicine to replace the thyroid hormones that your body does not make. This information is not intended to replace advice given to you by your health care provider. Make sure you discuss any questions you have with your health care provider. Document Revised: 06/18/2021 Document Reviewed: 06/18/2021 Elsevier Patient Education  2024 [employer].

## 2023-03-03 ENCOUNTER — Encounter: Payer: Self-pay | Admitting: Nurse Practitioner

## 2023-03-03 ENCOUNTER — Ambulatory Visit (INDEPENDENT_AMBULATORY_CARE_PROVIDER_SITE_OTHER): Payer: Medicare Other | Admitting: Nurse Practitioner

## 2023-03-03 VITALS — BP 142/68 | HR 59 | Temp 97.6°F | Resp 18 | Ht 66.5 in | Wt 149.6 lb

## 2023-03-03 DIAGNOSIS — E782 Mixed hyperlipidemia: Secondary | ICD-10-CM | POA: Diagnosis not present

## 2023-03-03 DIAGNOSIS — N1831 Chronic kidney disease, stage 3a: Secondary | ICD-10-CM | POA: Diagnosis not present

## 2023-03-03 DIAGNOSIS — I252 Old myocardial infarction: Secondary | ICD-10-CM

## 2023-03-03 DIAGNOSIS — R7301 Impaired fasting glucose: Secondary | ICD-10-CM

## 2023-03-03 DIAGNOSIS — I2510 Atherosclerotic heart disease of native coronary artery without angina pectoris: Secondary | ICD-10-CM

## 2023-03-03 DIAGNOSIS — Z23 Encounter for immunization: Secondary | ICD-10-CM

## 2023-03-03 DIAGNOSIS — E039 Hypothyroidism, unspecified: Secondary | ICD-10-CM | POA: Diagnosis not present

## 2023-03-03 DIAGNOSIS — I10 Essential (primary) hypertension: Secondary | ICD-10-CM

## 2023-03-03 DIAGNOSIS — D631 Anemia in chronic kidney disease: Secondary | ICD-10-CM | POA: Diagnosis not present

## 2023-03-03 DIAGNOSIS — Z0000 Encounter for general adult medical examination without abnormal findings: Secondary | ICD-10-CM | POA: Diagnosis not present

## 2023-03-03 DIAGNOSIS — I471 Supraventricular tachycardia, unspecified: Secondary | ICD-10-CM

## 2023-03-03 LAB — BAYER DCA HB A1C WAIVED: HB A1C (BAYER DCA - WAIVED): 5.9 % — ABNORMAL HIGH (ref 4.8–5.6)

## 2023-03-03 NOTE — Assessment & Plan Note (Signed)
Chronic, stable at this time with improved labs in August 2022.  Continue collaboration with hematology as needed only and nephrology as scheduled + current medication regimen.   

## 2023-03-03 NOTE — Assessment & Plan Note (Signed)
Chronic, ongoing with initial BP elevated, but trending down -- missed medications last night.  BP at home at goal.  Continue current medication regimen and adjust as needed. Recommend continue to monitor BP at home regularly and document for provider + focus on DASH diet.  Labs: up to date with nephrology.  Return in 6 months, continue to collaborate with cardiology.

## 2023-03-03 NOTE — Assessment & Plan Note (Signed)
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  Continue collaboration with cardiology and current medication regimen as prescribed by them.  Lipid panel today. 

## 2023-03-03 NOTE — Assessment & Plan Note (Signed)
Chronic, ongoing.  Followed by nephrology, continue this collaboration.  Recent notes reviewed.  Labs up to date.

## 2023-03-03 NOTE — Assessment & Plan Note (Signed)
Chronic, ongoing.  At this time continue current Levothyroxine dosing and adjust as needed based on labs.  Labs up to date.  Previously took Amiodarone.

## 2023-03-03 NOTE — Assessment & Plan Note (Signed)
Chronic, stable at this time with no symptoms.  Continue collaboration with cardiology.  Recent notes reviewed.  Recommend continue no caffeine use and increased calcium.

## 2023-03-03 NOTE — Assessment & Plan Note (Signed)
Chronic, stable.  NSTEMI, (inferior myocardial infarction), on 12/25/20 with heart cath.  At this time continue current medication regimen and collaboration with cardiology.  Lipid panel today.

## 2023-03-03 NOTE — Progress Notes (Signed)
BP (!) 142/68 (BP Location: Left Arm, Patient Position: Sitting, Cuff Size: Normal)   Pulse (!) 59   Temp 97.6 F (36.4 C) (Oral)   Resp 18   Ht 5' 6.5" (1.689 m)   Wt 149 lb 9.6 oz (67.9 kg)   SpO2 99%   BMI 23.78 kg/m    Subjective:    Patient ID: Roger Byrd, male    DOB: 06-21-1941, 82 y.o.   MRN: [id number]  HPI: Roger Byrd is a 82 y.o. male presenting on 03/03/2023 for Medicare Wellness and annual exam. Current medical complaints include:none  He currently lives with: son Interim Problems from his last visit: no   Forgot to take BP medication last night.    HYPERTENSION / HYPERLIPIDEMIA Taking Valsartan, ASA, Atorvastatin.  Saw cardiology, Madaline Guthrie, last for follow-up on 08/11/22.  Does have issues with occasional hypotension, a full work-up was done with cardiology - no findings.  Still goes to golf course 5 days a week and plays.  NSTEMI on 12/25/20 (inferior myocardial infarction), admitted to hospital and left heart cath performed - stent RCA 11/2020.   Satisfied with current treatment? yes Duration of hypertension: chronic BP monitoring frequency: daily BP range: 130/80 range on average BP medication side effects: no Duration of hyperlipidemia: chronic Cholesterol medication side effects: no Cholesterol supplements: none Medication compliance: good compliance Aspirin: yes Recent stressors: no Recurrent headaches: no Visual changes: no Palpitations: no -- not lately -- has Metoprolol he takes a needed. Dyspnea: no Chest pain: no Lower extremity edema: no Dizzy/lightheaded: no   Impaired Fasting Glucose HbA1C:  Lab Results  Component Value Date   HGBA1C 5.9 (H) 08/29/2022  Duration of elevated blood sugar:  Polydipsia: no Polyuria: no Weight change: no Visual disturbance: no  CHRONIC KIDNEY DISEASE Followed by nephrology, last visit 01/13/23.  Labs at that visit CRT 1.21 and GFR 60, PTH 51.  Saw hematology in past for anemia, to return as  needed. CKD status: stable Medications renally dose: yes Previous renal evaluation: yes Pneumovax:  Up to Date Influenza Vaccine:  Up to Date  HYPOTHYROIDISM Was on Levothyroxine 75 MCG. Thyroid control status:stable Satisfied with current treatment? yes Medication side effects: no Medication compliance: good compliance Etiology of hypothyroidism:  Recent dose adjustment:no Fatigue: no Cold intolerance: yes, at baseline Heat intolerance: no Weight gain: no Weight loss: no Constipation: occasional Diarrhea/loose stools: no Palpitations: no Lower extremity edema: no Anxiety/depressed mood: no   Functional Status Survey: Is the patient deaf or have difficulty hearing?: No Does the patient have difficulty seeing, even when wearing glasses/contacts?: No Does the patient have difficulty concentrating, remembering, or making decisions?: No Does the patient have difficulty walking or climbing stairs?: No Does the patient have difficulty dressing or bathing?: No Does the patient have difficulty doing errands alone such as visiting a doctor's office or shopping?: No  FALL RISK:    03/03/2023    2:46 PM 08/29/2022    3:20 PM 02/20/2022    8:27 AM 12/05/2021    1:08 PM 02/14/2021    9:34 AM  Fall Risk   Falls in the past year? 0 0 0 0 0  Number falls in past yr: 0 0 0 0 0  Injury with Fall? 0 0 0 0 0  Risk for fall due to : No Fall Risks No Fall Risks No Fall Risks  No Fall Risks  Follow up Falls evaluation completed Falls evaluation completed Falls evaluation completed Falls evaluation completed;Education provided;Falls prevention  discussed Follow up appointment    Depression Screen    03/03/2023    2:46 PM 08/29/2022    3:21 PM 02/20/2022    8:28 AM 12/05/2021    1:10 PM 08/22/2021    8:42 AM  Depression screen PHQ 2/9  Decreased Interest 0 0 0 0 0  Down, Depressed, Hopeless 0 0 0 0 0  PHQ - 2 Score 0 0 0 0 0  Altered sleeping 0 0 0  0  Tired, decreased energy 0 0 0  0  Change  in appetite 0 0 0  0  Feeling bad or failure about yourself  0 0 0  0  Trouble concentrating 0 0 0  0  Moving slowly or fidgety/restless 0 0 0  0  Suicidal thoughts 0 0 0  0  PHQ-9 Score 0 0 0  0  Difficult doing work/chores Not difficult at all Not difficult at all Not difficult at all  Not difficult at all      03/03/2023    2:46 PM 08/29/2022    3:21 PM 02/20/2022    8:28 AM 08/22/2021    8:42 AM  GAD 7 : Generalized Anxiety Score  Nervous, Anxious, on Edge 0 0 0 0  Control/stop worrying 0 0 0 0  Worry too much - different things 0 0 0 0  Trouble relaxing 0 0 0 0  Restless 0 0 0 0  Easily annoyed or irritable 0 0 0 0  Afraid - awful might happen 0 0 0 0  Total GAD 7 Score 0 0 0 0  Anxiety Difficulty Not difficult at all Not difficult at all Not difficult at all Not difficult at all   Past Medical History:  Past Medical History:  Diagnosis Date   Anxiety    Chronic kidney disease    Coronary artery disease    Dysrhythmia    Heart attack (HCC)    Hyperlipidemia    Hypertension    Hypothyroidism    Insomnia    Thyroid disease     Surgical History:  Past Surgical History:  Procedure Laterality Date   BACK SURGERY     x2   CARDIAC CATHETERIZATION     CARPAL TUNNEL RELEASE Right 03/08/2018   Procedure: CARPAL TUNNEL RELEASE;  Surgeon: Deeann Saint, MD;  Location: [facility] ORS;  Service: Orthopedics;  Laterality: Right;   COLONOSCOPY WITH PROPOFOL N/A 07/19/2015   Procedure: COLONOSCOPY WITH PROPOFOL;  Surgeon: Scot Jun, MD;  Location: [facility] ENDOSCOPY;  Service: Endoscopy;  Laterality: N/A;   CORONARY ANGIOPLASTY     CORONARY STENT PLACEMENT      Medications:  Current Outpatient Medications on File Prior to Visit  Medication Sig   aspirin 81 MG tablet Take 81 mg by mouth every evening.    atorvastatin (LIPITOR) 80 MG tablet Take 1 tablet by mouth daily at 12 noon.   levothyroxine (SYNTHROID) 75 MCG tablet Take 1 tablet (75 mcg total) by mouth daily.   metoprolol  tartrate (LOPRESSOR) 25 MG tablet Take 1 tablet (25 mg total) by mouth every 6 (six) hours as needed (for heart rate sustained >130 for 15 minutes and if systolic blood pressure (top number) is >110).   valsartan (DIOVAN) 80 MG tablet Take 40 mg by mouth at bedtime.   No current facility-administered medications on file prior to visit.    Allergies:  No Known Allergies  Social History:  Social History   Socioeconomic History   Marital status: Widowed  Spouse name: Not on file   Number of children: Not on file   Years of education: 12   Highest education level: 12th grade  Occupational History   Occupation: retired   Tobacco Use   Smoking status: Former    Current packs/day: 0.00    Types: Cigarettes    Quit date: 08/08/1970    Years since quitting: 52.6   Smokeless tobacco: Never  Vaping Use   Vaping status: Never Used  Substance and Sexual Activity   Alcohol use: Not Currently   Drug use: No   Sexual activity: Not Currently  Other Topics Concern   Not on file  Social History Narrative   Not on file   Social Determinants of Health   Financial Resource Strain: Low Risk  (03/03/2023)   Overall Financial Resource Strain (CARDIA)    Difficulty of Paying Living Expenses: Not hard at all  Food Insecurity: No Food Insecurity (03/03/2023)   Hunger Vital Sign    Worried About Running Out of Food in the Last Year: Never true    Ran Out of Food in the Last Year: Never true  Transportation Needs: No Transportation Needs (03/03/2023)   PRAPARE - Administrator, Civil Service (Medical): No    Lack of Transportation (Non-Medical): No  Physical Activity: Sufficiently Active (03/03/2023)   Exercise Vital Sign    Days of Exercise per Week: 5 days    Minutes of Exercise per Session: 90 min  Stress: No Stress Concern Present (03/03/2023)   [employer] of Occupational Health - Occupational Stress Questionnaire    Feeling of Stress : Not at all  Social Connections:  Moderately Integrated (03/03/2023)   Social Connection and Isolation Panel [NHANES]    Frequency of Communication with Friends and Family: More than three times a week    Frequency of Social Gatherings with Friends and Family: More than three times a week    Attends Religious Services: More than 4 times per year    Active Member of [employer] or Organizations: Yes    Attends Banker Meetings: More than 4 times per year    Marital Status: Widowed  Intimate Partner Violence: Not At Risk (03/03/2023)   Humiliation, Afraid, Rape, and Kick questionnaire    Fear of Current or Ex-Partner: No    Emotionally Abused: No    Physically Abused: No    Sexually Abused: No   Social History   Tobacco Use  Smoking Status Former   Current packs/day: 0.00   Types: Cigarettes   Quit date: 08/08/1970   Years since quitting: 52.6  Smokeless Tobacco Never   Social History   Substance and Sexual Activity  Alcohol Use Not Currently    Family History:  Family History  Problem Relation Age of Onset   Hypertension Mother    Heart disease Mother        heart attack   Hypertension Sister    Emphysema Father    Hypertension Daughter    Hypertension Son    Hypertension Sister    Hypertension Sister    Emphysema Sister     Past medical history, surgical history, medications, allergies, family history and social history reviewed with patient today and changes made to appropriate areas of the chart.   Review of Systems - negative All other ROS negative except what is listed above and in the HPI.      Objective:    BP (!) 142/68 (BP Location: Left Arm, Patient Position: Sitting,  Cuff Size: Normal)   Pulse (!) 59   Temp 97.6 F (36.4 C) (Oral)   Resp 18   Ht 5' 6.5" (1.689 m)   Wt 149 lb 9.6 oz (67.9 kg)   SpO2 99%   BMI 23.78 kg/m   Wt Readings from Last 3 Encounters:  03/03/23 149 lb 9.6 oz (67.9 kg)  08/29/22 146 lb 3.2 oz (66.3 kg)  06/25/22 142 lb (64.4 kg)    Physical  Exam Vitals and nursing note reviewed.  Constitutional:      General: He is awake. He is not in acute distress.    Appearance: He is well-developed and well-groomed. He is not ill-appearing.  HENT:     Head: Normocephalic and atraumatic.     Right Ear: Hearing, tympanic membrane, ear canal and external ear normal. No drainage.     Left Ear: Hearing, tympanic membrane, ear canal and external ear normal. No drainage.     Nose: Nose normal.     Mouth/Throat:     Pharynx: Uvula midline.  Eyes:     General: Lids are normal.        Right eye: No discharge.        Left eye: No discharge.     Extraocular Movements: Extraocular movements intact.     Conjunctiva/sclera: Conjunctivae normal.     Pupils: Pupils are equal, round, and reactive to light.     Visual Fields: Right eye visual fields normal and left eye visual fields normal.  Neck:     Thyroid: No thyromegaly.     Vascular: No carotid bruit or JVD.     Trachea: Trachea normal.  Cardiovascular:     Rate and Rhythm: Normal rate and regular rhythm.     Heart sounds: Normal heart sounds, S1 normal and S2 normal. No murmur heard.    No gallop.  Pulmonary:     Effort: Pulmonary effort is normal. No accessory muscle usage or respiratory distress.     Breath sounds: Normal breath sounds.  Abdominal:     General: Bowel sounds are normal.     Palpations: Abdomen is soft. There is no hepatomegaly or splenomegaly.     Tenderness: There is no abdominal tenderness.  Genitourinary:    Comments: Deferred per patient request Musculoskeletal:        General: Normal range of motion.     Cervical back: Normal range of motion and neck supple.     Right lower leg: No edema.     Left lower leg: No edema.  Lymphadenopathy:     Head:     Right side of head: No submental, submandibular, tonsillar, preauricular or posterior auricular adenopathy.     Left side of head: No submental, submandibular, tonsillar, preauricular or posterior auricular  adenopathy.     Cervical: No cervical adenopathy.  Skin:    General: Skin is warm and dry.     Capillary Refill: Capillary refill takes less than 2 seconds.     Findings: No rash.  Neurological:     Mental Status: He is alert and oriented to person, place, and time.     Gait: Gait is intact.     Deep Tendon Reflexes: Reflexes are normal and symmetric.     Reflex Scores:      Brachioradialis reflexes are 2+ on the right side and 2+ on the left side.      Patellar reflexes are 2+ on the right side and 2+ on the left side. Psychiatric:  Attention and Perception: Attention normal.        Mood and Affect: Mood normal.        Speech: Speech normal.        Behavior: Behavior normal. Behavior is cooperative.        Thought Content: Thought content normal.        Cognition and Memory: Cognition normal.        Judgment: Judgment normal.       03/03/2023    2:51 PM 12/05/2021    1:04 PM 12/03/2020    3:22 PM 05/28/2017    3:56 PM  6CIT Screen  What Year? 0 points 0 points 0 points 0 points  What month? 0 points 0 points 0 points 0 points  What time? 0 points 0 points 0 points 0 points  Count back from 20 0 points 0 points 0 points 0 points  Months in reverse 0 points 0 points 4 points 0 points  Repeat phrase 4 points 0 points 0 points 2 points  Total Score 4 points 0 points 4 points 2 points   Results for orders placed or performed in visit on 08/29/22  Bayer DCA Hb A1c Waived  Result Value Ref Range   HB A1C (BAYER DCA - WAIVED) 5.9 (H) 4.8 - 5.6 %  Microalbumin, Urine Waived  Result Value Ref Range   Microalb, Ur Waived 80 (H) 0 - 19 mg/L   Creatinine, Urine Waived 50 10 - 300 mg/dL   Microalb/Creat Ratio 30-300 (H) <30 mg/g  Lipid Panel w/o Chol/HDL Ratio  Result Value Ref Range   Cholesterol, Total 139 100 - 199 mg/dL   Triglycerides 010 (H) 0 - 149 mg/dL   HDL 46 >27 mg/dL   VLDL Cholesterol Cal 27 5 - 40 mg/dL   LDL Chol Calc (NIH) 66 0 - 99 mg/dL  CBC with  Differential/Platelet  Result Value Ref Range   WBC 5.9 3.4 - 10.8 x10E3/uL   RBC 5.41 4.14 - 5.80 x10E6/uL   Hemoglobin 13.9 13.0 - 17.7 g/dL   Hematocrit 25.3 66.4 - 51.0 %   MCV 78 (L) 79 - 97 fL   MCH 25.7 (L) 26.6 - 33.0 pg   MCHC 32.9 31.5 - 35.7 g/dL   RDW 40.3 47.4 - 25.9 %   Platelets 301 150 - 450 x10E3/uL   Neutrophils 57 Not Estab. %   Lymphs 29 Not Estab. %   Monocytes 8 Not Estab. %   Eos 5 Not Estab. %   Basos 1 Not Estab. %   Neutrophils Absolute 3.4 1.4 - 7.0 x10E3/uL   Lymphocytes Absolute 1.7 0.7 - 3.1 x10E3/uL   Monocytes Absolute 0.5 0.1 - 0.9 x10E3/uL   EOS (ABSOLUTE) 0.3 0.0 - 0.4 x10E3/uL   Basophils Absolute 0.0 0.0 - 0.2 x10E3/uL   Immature Granulocytes 0 Not Estab. %   Immature Grans (Abs) 0.0 0.0 - 0.1 x10E3/uL  TSH  Result Value Ref Range   TSH 1.870 0.450 - 4.500 uIU/mL  T4, free  Result Value Ref Range   Free T4 1.31 0.82 - 1.77 ng/dL      Assessment & Plan:   Problem List Items Addressed This Visit       Cardiovascular and Mediastinum   Coronary artery disease involving native coronary artery of native heart    Chronic, stable.  NSTEMI, (inferior myocardial infarction), on 12/25/20 with heart cath.  At this time continue current medication regimen and collaboration with cardiology.  Lipid panel today.  Hypertension    Chronic, ongoing with initial BP elevated, but trending down -- missed medications last night.  BP at home at goal.  Continue current medication regimen and adjust as needed. Recommend continue to monitor BP at home regularly and document for provider + focus on DASH diet.  Labs: up to date with nephrology.  Return in 6 months, continue to collaborate with cardiology.      SVT (supraventricular tachycardia)    Chronic, stable at this time with no symptoms.  Continue collaboration with cardiology.  Recent notes reviewed.  Recommend continue no caffeine use and increased calcium.        Endocrine   Hypothyroidism     Chronic, ongoing.  At this time continue current Levothyroxine dosing and adjust as needed based on labs.  Labs up to date.  Previously took Amiodarone.      IFG (impaired fasting glucose)    A1c trend up 5.9% today, similar to last check.  Continue diet and exercise focus at home.      Relevant Orders   Bayer DCA Hb A1c Waived     Genitourinary   Anemia due to stage 3a chronic kidney disease (HCC)    Chronic, stable at this time with improved labs in August 2022.  Continue collaboration with hematology as needed only and nephrology as scheduled + current medication regimen.        Chronic kidney disease (CKD), stage III (moderate) (HCC)    Chronic, ongoing.  Followed by nephrology, continue this collaboration.  Recent notes reviewed.  Labs up to date.        Other   History of non-ST elevation myocardial infarction (NSTEMI)    On 12/25/20, (inferior myocardial infarction).  Continue collaboration with cardiology and current medication regimen as prescribed by them.  Lipid panel today.      Hyperlipidemia    Chronic, ongoing.  Continue current medication regimen and adjust as needed.  Lipid panel today.      Relevant Orders   Lipid Panel w/o Chol/HDL Ratio   Other Visit Diagnoses     Medicare annual wellness visit, subsequent    -  Primary   Medicare wellness due and performed today.   Flu vaccine need       Flu vaccine in office today, educated patient.   Relevant Orders   Flu Vaccine Trivalent High Dose (Fluad)   Encounter for annual physical exam       Annual physical today with labs and health maintenance reviewed, discussed with patient.       Discussed aspirin prophylaxis for myocardial infarction prevention and decision was made to continue ASA  LABORATORY TESTING:  Health maintenance labs ordered today as discussed above.   The natural history of prostate cancer and ongoing controversy regarding screening and potential treatment outcomes of prostate cancer has  been discussed with the patient. The meaning of a false positive PSA and a false negative PSA has been discussed. He indicates understanding of the limitations of this screening test and wishes not to proceed with screening PSA testing.   IMMUNIZATIONS:   - Tdap: Tetanus vaccination status reviewed: Up To Date - Influenza: Up to date - provided today - Pneumovax: Up to date - Prevnar: Up to date - Zostavax vaccine: Up To Date  SCREENING: - Colonoscopy: Not applicable Discussed with patient purpose of the colonoscopy is to detect colon cancer at curable precancerous or early stages   - AAA Screening: Not applicable  -Hearing Test: Not applicable  -  Spirometry: Not applicable   PATIENT COUNSELING:    Sexuality: Discussed sexually transmitted diseases, partner selection, use of condoms, avoidance of unintended pregnancy  and contraceptive alternatives.   Advised to avoid cigarette smoking.  I discussed with the patient that most people either abstain from alcohol or drink within safe limits (<=14/week and <=4 drinks/occasion for males, <=7/weeks and <= 3 drinks/occasion for females) and that the risk for alcohol disorders and other health effects rises proportionally with the number of drinks per week and how often a drinker exceeds daily limits.  Discussed cessation/primary prevention of drug use and availability of treatment for abuse.   Diet: Encouraged to adjust caloric intake to maintain  or achieve ideal body weight, to reduce intake of dietary saturated fat and total fat, to limit sodium intake by avoiding high sodium foods and not adding table salt, and to maintain adequate dietary potassium and calcium preferably from fresh fruits, vegetables, and low-fat dairy products.    Stressed the importance of regular exercise  Injury prevention: Discussed safety belts, safety helmets, smoke detector, smoking near bedding or upholstery.   Dental health: Discussed importance of regular  tooth brushing, flossing, and dental visits.   Follow up plan: NEXT PREVENTATIVE PHYSICAL DUE IN 1 YEAR. Return in about 6 months (around 08/31/2023) for HTN/HLD, THYROID, CKD, IFG.

## 2023-03-03 NOTE — Assessment & Plan Note (Signed)
Chronic, ongoing.  Continue current medication regimen and adjust as needed. Lipid panel today. 

## 2023-03-03 NOTE — Assessment & Plan Note (Signed)
A1c trend up 5.9% today, similar to last check.  Continue diet and exercise focus at home.

## 2023-03-04 LAB — LIPID PANEL W/O CHOL/HDL RATIO
Cholesterol, Total: 158 mg/dL (ref 100–199)
HDL: 45 mg/dL (ref >39)
LDL Chol Calc (NIH): 95 mg/dL (ref 0–99)
Triglycerides: 94 mg/dL (ref 0–149)
VLDL Cholesterol Cal: 18 mg/dL (ref 5–40)

## 2023-03-04 NOTE — Progress Notes (Signed)
Contacted via MyChart   Good morning Roger Byrd, your labs have returned and LDL a little up from previous on labs.  Please ensure to take your Atorvastatin daily and we will recheck next visit with you fasting, if still elevated we may add on Zetia to see if this helps lower numbers further for stroke and heart attack prevention.  Any questions? Keep being amazing!!  Thank you for allowing me to participate in your care.  I appreciate you. Kindest regards, Kennadie Brenner

## 2023-05-11 DIAGNOSIS — I10 Essential (primary) hypertension: Secondary | ICD-10-CM | POA: Diagnosis not present

## 2023-05-11 DIAGNOSIS — I2510 Atherosclerotic heart disease of native coronary artery without angina pectoris: Secondary | ICD-10-CM | POA: Diagnosis not present

## 2023-05-11 DIAGNOSIS — E785 Hyperlipidemia, unspecified: Secondary | ICD-10-CM | POA: Diagnosis not present

## 2023-05-11 DIAGNOSIS — N1831 Chronic kidney disease, stage 3a: Secondary | ICD-10-CM | POA: Diagnosis not present

## 2023-05-11 DIAGNOSIS — E782 Mixed hyperlipidemia: Secondary | ICD-10-CM | POA: Diagnosis not present

## 2023-05-11 DIAGNOSIS — I471 Supraventricular tachycardia, unspecified: Secondary | ICD-10-CM | POA: Diagnosis not present

## 2023-06-17 DIAGNOSIS — R809 Proteinuria, unspecified: Secondary | ICD-10-CM | POA: Diagnosis not present

## 2023-06-17 DIAGNOSIS — I10 Essential (primary) hypertension: Secondary | ICD-10-CM | POA: Diagnosis not present

## 2023-06-17 DIAGNOSIS — N182 Chronic kidney disease, stage 2 (mild): Secondary | ICD-10-CM | POA: Diagnosis not present

## 2023-08-31 ENCOUNTER — Encounter: Payer: Self-pay | Admitting: Nurse Practitioner

## 2023-08-31 ENCOUNTER — Ambulatory Visit: Payer: Medicare Other | Admitting: Nurse Practitioner

## 2023-08-31 DIAGNOSIS — R7301 Impaired fasting glucose: Secondary | ICD-10-CM

## 2023-08-31 DIAGNOSIS — E039 Hypothyroidism, unspecified: Secondary | ICD-10-CM

## 2023-08-31 DIAGNOSIS — I10 Essential (primary) hypertension: Secondary | ICD-10-CM

## 2023-08-31 DIAGNOSIS — I2510 Atherosclerotic heart disease of native coronary artery without angina pectoris: Secondary | ICD-10-CM

## 2023-08-31 DIAGNOSIS — E782 Mixed hyperlipidemia: Secondary | ICD-10-CM

## 2023-08-31 DIAGNOSIS — D631 Anemia in chronic kidney disease: Secondary | ICD-10-CM

## 2023-08-31 DIAGNOSIS — I471 Supraventricular tachycardia, unspecified: Secondary | ICD-10-CM

## 2023-08-31 DIAGNOSIS — N1831 Chronic kidney disease, stage 3a: Secondary | ICD-10-CM

## 2023-09-13 NOTE — Patient Instructions (Signed)
 Chronic Kidney Disease: Eating Plan Chronic kidney disease (CKD) is when your kidneys aren't working well. They can't remove waste, fluids, and other substances from your blood. When these substances build up, they can worsen kidney damage and affect your health. Eating certain foods can lead to a buildup of these substances. Changing your diet can help prevent more kidney damage. Diet changes may also delay dialysis or even keep you from needing it. What nutrients should I limit? Work with your health care team and an expert in healthy eating (dietitian) to make a meal plan that's right for you. Foods you can eat and foods you should limit or avoid will depend on: The stage of your kidney disease. Any other conditions you have. The items listed below are not a complete list. Talk with a dietitian to learn what's best for you. Potassium Potassium affects how well your heart beats. Too much potassium in your blood can cause an irregular heartbeat or even a heart attack. You may need to limit foods that are high in potassium, such as: Liquid milk and soy milk. Salt substitutes that contain potassium. Fruits like: Bananas. Apricots. Melon. Prunes and raisins. Kiwi. Nectarines and oranges. Vegetables, such as: Potatoes, sweet potatoes, and yams. Tomatoes. Leafy greens. Beets. Avocado. Pumpkin and winter squash. Beans, like lima beans. Nuts. Phosphorus Phosphorus is a mineral found in your bones. You need a balance between calcium and phosphorus to build and maintain healthy bones.  Too much added phosphorus from the foods you eat can pull calcium from your bones. Losing calcium can make your bones weak and more likely to break. Too much phosphorus can also make your skin itch. You may need to limit foods that are high in phosphorus or that have added phosphorus, such as: Liquid milk and dairy products. Dark-colored sodas or soft drinks. Bran cereals and oatmeal. Protein  Protein  helps your body make and keep muscle. Protein also helps to repair your body's cells and tissues.  One of the natural breakdown products of protein is a waste product called urea. When your kidneys aren't working well, they can't remove waste like urea. Reducing protein in your diet can help keep urea from building up in your blood. Depending on your stage of kidney disease, you may need to eat smaller portions of foods that are high in protein. Sources of animal protein include: Meat (all types). Fish and seafood. Poultry. Eggs. Dairy. Other protein foods include: Beans and legumes. Nuts and nut butter. Soy, like tofu.  Sodium Salt (sodium) helps to keep a healthy balance of fluids in your body. Too much salt can increase your blood pressure, which can harm your heart and lungs. Extra salt can also cause your body to keep too much fluid, making your kidneys work harder. You may need to limit or avoid foods that are high in salt, such as: Salt seasonings. Soy and teriyaki sauce. Meats that are: Packaged. Precooked. Cured. Processed. Salted crackers and snack foods. Fast food. Canned soups and foods. Pickled foods. Boxed mixes or ready-to-eat boxed meals and side dishes. Bottled dressings, sauces, and marinades. Talk with your dietitian about how much potassium, phosphorus, protein, and salt you may have each day. What are tips for following this plan? Reading food labels  Check the amount of salt in foods. Limit foods that have salt listed among the first five ingredients. Try to eat low-salt foods. Check the ingredient list for added phosphorus or potassium. "Phos" in an ingredient is a sign that  phosphorus has been added. Do not buy foods that are calcium-enriched or that have calcium added to them (fortified). Buy canned vegetables and beans that say "no salt added." Rinse them before eating. Lifestyle Limit the amount of protein you eat from animal sources each day. Focus on  protein from plant sources, like tofu and dried beans, peas, and lentils. Do not add salt to food when cooking or before eating. Do not eat star fruit. It can be toxic for people with kidney problems. Talk with your health care provider before taking any vitamin or mineral supplements. If told by your provider: Track how much liquid you have so you can avoid drinking too much. Try to eat foods that are made mostly from water, like gelatin, ice cream, soups, and juicy fruits and vegetables. If you have diabetes and chronic kidney disease: If you have diabetes and CKD, you need to keep your blood sugar (glucose) in the target range recommended by your provider. Follow your diabetes management plan. This may include: Checking your blood glucose regularly. Taking medicines by mouth, or taking insulin, or both. Exercising for at least 30 minutes on 5 or more days each week, or as told by your provider. Tracking how many servings of carbohydrates you eat at each meal. Not using orange juice to treat low blood sugars. Instead, use apple juice, cranberry juice, or clear soda. You may be given guidelines on what foods and nutrients you may eat, and how much you can have each day. This depends on your stage of kidney disease and whether you have high blood pressure. Follow the meal plan your dietitian gives you. Where to find more information [employer] Diabetes and Digestive and Kidney Diseases: [employer]Sync.si National Kidney Foundation: kidney.org This information is not intended to replace advice given to you by your health care provider. Make sure you discuss any questions you have with your health care provider. Document Revised: 01/27/2023 Document Reviewed: 01/27/2023 Elsevier Patient Education  2024 [employer].

## 2023-09-16 ENCOUNTER — Encounter: Payer: Self-pay | Admitting: Nurse Practitioner

## 2023-09-16 ENCOUNTER — Ambulatory Visit (INDEPENDENT_AMBULATORY_CARE_PROVIDER_SITE_OTHER): Admitting: Nurse Practitioner

## 2023-09-16 VITALS — BP 132/57 | HR 59 | Temp 97.8°F | Ht 66.5 in | Wt 149.2 lb

## 2023-09-16 DIAGNOSIS — E782 Mixed hyperlipidemia: Secondary | ICD-10-CM | POA: Diagnosis not present

## 2023-09-16 DIAGNOSIS — I10 Essential (primary) hypertension: Secondary | ICD-10-CM | POA: Diagnosis not present

## 2023-09-16 DIAGNOSIS — R7301 Impaired fasting glucose: Secondary | ICD-10-CM

## 2023-09-16 DIAGNOSIS — D631 Anemia in chronic kidney disease: Secondary | ICD-10-CM | POA: Diagnosis not present

## 2023-09-16 DIAGNOSIS — E039 Hypothyroidism, unspecified: Secondary | ICD-10-CM

## 2023-09-16 DIAGNOSIS — N1831 Chronic kidney disease, stage 3a: Secondary | ICD-10-CM

## 2023-09-16 DIAGNOSIS — I252 Old myocardial infarction: Secondary | ICD-10-CM

## 2023-09-16 DIAGNOSIS — I2510 Atherosclerotic heart disease of native coronary artery without angina pectoris: Secondary | ICD-10-CM

## 2023-09-16 DIAGNOSIS — I471 Supraventricular tachycardia, unspecified: Secondary | ICD-10-CM | POA: Diagnosis not present

## 2023-09-16 MED ORDER — LEVOTHYROXINE SODIUM 75 MCG PO TABS: 75.0000 ug | ORAL_TABLET | Freq: Every day | ORAL | 4 refills | Status: DC

## 2023-09-16 NOTE — Assessment & Plan Note (Signed)
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  Continue collaboration with cardiology and current medication regimen as prescribed by them.  Lipid panel today. 

## 2023-09-16 NOTE — Assessment & Plan Note (Signed)
 Chronic, ongoing.  Continue current medication regimen and adjust as needed. Lipid panel today.

## 2023-09-16 NOTE — Assessment & Plan Note (Signed)
Chronic, stable.  NSTEMI, (inferior myocardial infarction), on 12/25/20 with heart cath.  At this time continue current medication regimen and collaboration with cardiology.  Lipid panel today.

## 2023-09-16 NOTE — Assessment & Plan Note (Signed)
Chronic, stable at this time with no symptoms.  Continue collaboration with cardiology.  Recent notes reviewed.  Recommend continue no caffeine use and increased calcium.

## 2023-09-16 NOTE — Assessment & Plan Note (Signed)
Chronic, stable at this time with improved labs in August 2022.  Continue collaboration with hematology as needed only and nephrology as scheduled + current medication regimen.   

## 2023-09-16 NOTE — Assessment & Plan Note (Signed)
 Chronic, ongoing. BP well below goal in office for age.  Continue current medication regimen and adjust as needed. Recommend continue to monitor BP at home regularly and document for provider + focus on DASH diet.  Labs: CMP.  Return in 6 months, continue to collaborate with cardiology.

## 2023-09-16 NOTE — Assessment & Plan Note (Signed)
Chronic, ongoing.  At this time continue current Levothyroxine dosing and adjust as needed based on labs.  Labs today.  Previously took Amiodarone.

## 2023-09-16 NOTE — Assessment & Plan Note (Signed)
 Chronic, ongoing.  Followed by nephrology, continue this collaboration.  Recent notes reviewed.  Labs: CMP.

## 2023-09-16 NOTE — Progress Notes (Signed)
 BP (!) 132/57   Pulse (!) 59   Temp 97.8 F (36.6 C) (Oral)   Ht 5' 6.5" (1.689 m)   Wt 149 lb 3.2 oz (67.7 kg)   SpO2 98%   BMI 23.72 kg/m    Subjective:    Patient ID: Roger Byrd, male    DOB: March 25, 1941, 83 y.o.   MRN: [id number]  HPI: Roger Byrd is a 83 y.o. male  Chief Complaint  Patient presents with   Chronic Kidney Disease   Hyperlipidemia   Hypertension   Hypothyroidism   HYPERTENSION / HYPERLIPIDEMIA Continues Valsartan, ASA, Metoprolol, Atorvastatin.  History NSTEMI on 12/25/20 (inferior myocardial infarction), admitted to hospital at time and left heart cath performed with stent placement.  Continues to go golfing 5 days a week.  Last saw cardiology on 05/11/23 with no changes.  Had stress test and echo on 01/30/2022 = EF 61% with normal LV function and mild LVH.   Satisfied with current treatment? yes Duration of hypertension: chronic BP monitoring frequency: not checking BP range:  BP medication side effects: no Duration of hyperlipidemia: chronic Cholesterol medication side effects: no Cholesterol supplements: none Medication compliance: good compliance Aspirin: yes Recent stressors: no Recurrent headaches: no Visual changes: no Palpitations: at times feels heart rate elevate, cardiology is aware Dyspnea: no Chest pain: none in 2 months Lower extremity edema: no Dizzy/lightheaded: no  The ASCVD Risk score (Arnett DK, et al., 2019) failed to calculate for the following reasons:   The 2019 ASCVD risk score is only valid for ages 65 to 51   Risk score cannot be calculated because patient has a medical history suggesting prior/existing ASCVD  Impaired Fasting Glucose HbA1C:  Lab Results  Component Value Date   HGBA1C 5.9 (H) 03/03/2023  Duration of elevated blood sugar: years Polydipsia: no Polyuria: no Weight change: no Visual disturbance: no Glucose Monitoring: no    Accucheck frequency: Not Checking    Fasting glucose:     Post  prandial:  Diabetic Education: Not Completed Family history of diabetes: no   CHRONIC KIDNEY DISEASE Sees nephrology every 6 months, last 06/17/23. Saw hematology in past for anemia, last in 2022, to return as needed. CKD status: stable Medications renally dose: yes Previous renal evaluation: yes Pneumovax:  Up to Date Influenza Vaccine:  Up to Date   HYPOTHYROIDISM Taking Levothyroxine 75 MCG. Thyroid control status:stable Satisfied with current treatment? yes Medication side effects: no Medication compliance: good compliance Etiology of hypothyroidism:  Recent dose adjustment:no Fatigue: no Cold intolerance: no Heat intolerance: no Weight gain: no Weight loss: no Constipation: no Diarrhea/loose stools: no Palpitations: no Lower extremity edema: no Anxiety/depressed mood: no   Relevant past medical, surgical, family and social history reviewed and updated as indicated. Interim medical history since our last visit reviewed. Allergies and medications reviewed and updated.  Review of Systems  Constitutional:  Negative for activity change, diaphoresis, fatigue and fever.  Respiratory:  Negative for cough, chest tightness, shortness of breath and wheezing.   Cardiovascular:  Negative for chest pain, palpitations and leg swelling.  Gastrointestinal: Negative.   Endocrine: Negative for cold intolerance, heat intolerance, polydipsia, polyphagia and polyuria.  Skin: Negative.   Psychiatric/Behavioral: Negative.     Per HPI unless specifically indicated above     Objective:    BP (!) 132/57   Pulse (!) 59   Temp 97.8 F (36.6 C) (Oral)   Ht 5' 6.5" (1.689 m)   Wt 149 lb 3.2 oz (67.7  kg)   SpO2 98%   BMI 23.72 kg/m   Wt Readings from Last 3 Encounters:  09/16/23 149 lb 3.2 oz (67.7 kg)  03/03/23 149 lb 9.6 oz (67.9 kg)  08/29/22 146 lb 3.2 oz (66.3 kg)    Physical Exam Vitals and nursing note reviewed.  Constitutional:      General: He is awake. He is not in  acute distress.    Appearance: He is well-developed and well-groomed. He is not ill-appearing or toxic-appearing.  HENT:     Head: Normocephalic and atraumatic.     Right Ear: Hearing, tympanic membrane, ear canal and external ear normal. No drainage.     Left Ear: Hearing, tympanic membrane, ear canal and external ear normal. No drainage.  Eyes:     General: Lids are normal.        Right eye: No discharge.        Left eye: No discharge.     Conjunctiva/sclera: Conjunctivae normal.     Pupils: Pupils are equal, round, and reactive to light.  Neck:     Thyroid: No thyromegaly.     Vascular: No carotid bruit.     Trachea: Trachea normal.  Cardiovascular:     Rate and Rhythm: Normal rate and regular rhythm.     Heart sounds: Normal heart sounds, S1 normal and S2 normal. No murmur heard.    No gallop.  Pulmonary:     Effort: Pulmonary effort is normal. No accessory muscle usage or respiratory distress.     Breath sounds: Normal breath sounds.  Abdominal:     General: Bowel sounds are normal.     Palpations: Abdomen is soft. There is no hepatomegaly or splenomegaly.  Musculoskeletal:        General: Normal range of motion.     Cervical back: Normal range of motion and neck supple.     Right lower leg: No edema.     Left lower leg: No edema.  Skin:    General: Skin is warm and dry.     Capillary Refill: Capillary refill takes less than 2 seconds.     Findings: No rash.  Neurological:     Mental Status: He is alert and oriented to person, place, and time.     Deep Tendon Reflexes: Reflexes are normal and symmetric.  Psychiatric:        Attention and Perception: Attention normal.        Mood and Affect: Mood normal.        Speech: Speech normal.        Behavior: Behavior normal. Behavior is cooperative.        Thought Content: Thought content normal.    Results for orders placed or performed in visit on 03/03/23  Lipid Panel w/o Chol/HDL Ratio   Collection Time: 03/03/23  3:02  PM  Result Value Ref Range   Cholesterol, Total 158 100 - 199 mg/dL   Triglycerides 94 0 - 149 mg/dL   HDL 45 >56 mg/dL   VLDL Cholesterol Cal 18 5 - 40 mg/dL   LDL Chol Calc (NIH) 95 0 - 99 mg/dL  Bayer DCA Hb O1H Waived   Collection Time: 03/03/23  3:13 PM  Result Value Ref Range   HB A1C (BAYER DCA - WAIVED) 5.9 (H) 4.8 - 5.6 %      Assessment & Plan:   Problem List Items Addressed This Visit       Cardiovascular and Mediastinum   Coronary artery disease  involving native coronary artery of native heart   Chronic, stable.  NSTEMI, (inferior myocardial infarction), on 12/25/20 with heart cath.  At this time continue current medication regimen and collaboration with cardiology.  Lipid panel today.      Hypertension   Chronic, ongoing. BP well below goal in office for age.  Continue current medication regimen and adjust as needed. Recommend continue to monitor BP at home regularly and document for provider + focus on DASH diet.  Labs: CMP.  Return in 6 months, continue to collaborate with cardiology.      Relevant Orders   Comprehensive metabolic panel   SVT (supraventricular tachycardia) (HCC)   Chronic, stable at this time with no symptoms.  Continue collaboration with cardiology.  Recent notes reviewed.  Recommend continue no caffeine use and increased calcium.        Endocrine   Hypothyroidism   Chronic, ongoing.  At this time continue current Levothyroxine dosing and adjust as needed based on labs.  Labs today.  Previously took Amiodarone.      Relevant Medications   levothyroxine (SYNTHROID) 75 MCG tablet   Other Relevant Orders   TSH   T4, free   IFG (impaired fasting glucose)   A1c trend up 5.9% last visit, similar to last check.  Continue diet and exercise focus at home.      Relevant Orders   HgB A1c     Genitourinary   Anemia due to stage 3a chronic kidney disease (HCC)   Chronic, stable at this time with improved labs in August 2022.  Continue  collaboration with hematology as needed only and nephrology as scheduled + current medication regimen.        Chronic kidney disease (CKD), stage III (moderate) (HCC) - Primary   Chronic, ongoing.  Followed by nephrology, continue this collaboration.  Recent notes reviewed.  Labs: CMP.      Relevant Orders   Comprehensive metabolic panel     Other   History of non-ST elevation myocardial infarction (NSTEMI)   On 12/25/20, (inferior myocardial infarction).  Continue collaboration with cardiology and current medication regimen as prescribed by them.  Lipid panel today.      Hyperlipidemia   Chronic, ongoing.  Continue current medication regimen and adjust as needed.  Lipid panel today.      Relevant Orders   Comprehensive metabolic panel   Lipid Panel w/o Chol/HDL Ratio     Follow up plan: Return in about 6 months (around 03/18/2024) for Annual Physical -- after 03/03/23.

## 2023-09-16 NOTE — Assessment & Plan Note (Signed)
 A1c trend up 5.9% last visit, similar to last check.  Continue diet and exercise focus at home.

## 2023-09-17 ENCOUNTER — Encounter: Payer: Self-pay | Admitting: Nurse Practitioner

## 2023-09-17 LAB — LIPID PANEL W/O CHOL/HDL RATIO
Cholesterol, Total: 125 mg/dL (ref 100–199)
HDL: 42 mg/dL (ref >39)
LDL Chol Calc (NIH): 66 mg/dL (ref 0–99)
Triglycerides: 88 mg/dL (ref 0–149)
VLDL Cholesterol Cal: 17 mg/dL (ref 5–40)

## 2023-09-17 LAB — COMPREHENSIVE METABOLIC PANEL
ALT: 17 IU/L (ref 0–44)
AST: 19 IU/L (ref 0–40)
Albumin: 4.2 g/dL (ref 3.7–4.7)
Alkaline Phosphatase: 62 IU/L (ref 44–121)
BUN/Creatinine Ratio: 22 (ref 10–24)
BUN: 31 mg/dL — ABNORMAL HIGH (ref 8–27)
Bilirubin Total: 0.5 mg/dL (ref 0.0–1.2)
CO2: 21 mmol/L (ref 20–29)
Calcium: 9.1 mg/dL (ref 8.6–10.2)
Chloride: 105 mmol/L (ref 96–106)
Creatinine, Ser: 1.44 mg/dL — ABNORMAL HIGH (ref 0.76–1.27)
Globulin, Total: 2.2 g/dL (ref 1.5–4.5)
Glucose: 112 mg/dL — ABNORMAL HIGH (ref 70–99)
Potassium: 4.2 mmol/L (ref 3.5–5.2)
Sodium: 139 mmol/L (ref 134–144)
Total Protein: 6.4 g/dL (ref 6.0–8.5)
eGFR: 49 mL/min/{1.73_m2} — ABNORMAL LOW (ref >59)

## 2023-09-17 LAB — T4, FREE: Free T4: 1.36 ng/dL (ref 0.82–1.77)

## 2023-09-17 LAB — HEMOGLOBIN A1C
Est. average glucose Bld gHb Est-mCnc: 128 mg/dL
Hgb A1c MFr Bld: 6.1 % — ABNORMAL HIGH (ref 4.8–5.6)

## 2023-09-17 LAB — TSH: TSH: 3.41 u[IU]/mL (ref 0.450–4.500)

## 2023-09-17 NOTE — Progress Notes (Signed)
 Contacted via MyChart   Good afternoon Roger Byrd, your labs have returned: - Kidney function continues to show chronic kidney disease stage 3a with no progression. - Liver function is normal, AST and ALT. - A1c continues to show some prediabetes, but went up a little from previous check at 6.1%. - Remainder of labs stable.  No medication changes needed.  Any questions? Keep being amazing!!  Thank you for allowing me to participate in your care.  I appreciate you. Kindest regards, Emersynn Deatley

## 2023-12-16 DIAGNOSIS — I10 Essential (primary) hypertension: Secondary | ICD-10-CM | POA: Diagnosis not present

## 2023-12-16 DIAGNOSIS — R809 Proteinuria, unspecified: Secondary | ICD-10-CM | POA: Diagnosis not present

## 2023-12-16 DIAGNOSIS — N1831 Chronic kidney disease, stage 3a: Secondary | ICD-10-CM | POA: Diagnosis not present

## 2024-02-09 DIAGNOSIS — E782 Mixed hyperlipidemia: Secondary | ICD-10-CM | POA: Diagnosis not present

## 2024-02-09 DIAGNOSIS — I10 Essential (primary) hypertension: Secondary | ICD-10-CM | POA: Diagnosis not present

## 2024-02-09 DIAGNOSIS — I471 Supraventricular tachycardia, unspecified: Secondary | ICD-10-CM | POA: Diagnosis not present

## 2024-02-09 DIAGNOSIS — I2510 Atherosclerotic heart disease of native coronary artery without angina pectoris: Secondary | ICD-10-CM | POA: Diagnosis not present

## 2024-02-09 DIAGNOSIS — N1831 Chronic kidney disease, stage 3a: Secondary | ICD-10-CM | POA: Diagnosis not present

## 2024-03-19 NOTE — Patient Instructions (Signed)
 Be Involved in Caring For Your Health:  Taking Medications When medications are taken as directed, they can greatly improve your health. But if they are not taken as prescribed, they may not work. In some cases, not taking them correctly can be harmful. To help ensure your treatment remains effective and safe, understand your medications and how to take them. Bring your medications to each visit for review by your provider.  Your lab results, notes, and after visit summary will be available on My Chart. We strongly encourage you to use this feature. If lab results are abnormal the clinic will contact you with the appropriate steps. If the clinic does not contact you assume the results are satisfactory. You can always view your results on My Chart. If you have questions regarding your health or results, please contact the clinic during office hours. You can also ask questions on My Chart.  We at [facility] are grateful that you chose [place] to provide your care. We strive to provide evidence-based and compassionate care and are always looking for feedback. If you get a survey from the clinic please complete this so we can hear your opinions.  Heart-Healthy Eating Plan Many factors influence your heart health, including eating and exercise habits. Heart health is also called coronary health. Coronary risk increases with abnormal blood fat (lipid) levels. A heart-healthy eating plan includes limiting unhealthy fats, increasing healthy fats, limiting salt (sodium) intake, and making other diet and lifestyle changes. What is my plan? Your health care provider may recommend that: You limit your fat intake to _________% or less of your total calories each day. You limit your saturated fat intake to _________% or less of your total calories each day. You limit the amount of cholesterol in your diet to less than _________ mg per day. You limit the amount of sodium in your diet to less than _________  mg per day. What are tips for following this plan? Cooking Cook foods using methods other than frying. Baking, boiling, grilling, and broiling are all good options. Other ways to reduce fat include: Removing the skin from poultry. Removing all visible fats from meats. Steaming vegetables in water or broth. Meal planning  At meals, imagine dividing your plate into fourths: Fill one-half of your plate with vegetables and green salads. Fill one-fourth of your plate with whole grains. Fill one-fourth of your plate with lean protein foods. Eat 2-4 cups of vegetables per day. One cup of vegetables equals 1 cup (91 g) broccoli or cauliflower florets, 2 medium carrots, 1 large bell pepper, 1 large sweet potato, 1 large tomato, 1 medium white potato, 2 cups (150 g) raw leafy greens. Eat 1-2 cups of fruit per day. One cup of fruit equals 1 small apple, 1 large banana, 1 cup (237 g) mixed fruit, 1 large orange,  cup (82 g) dried fruit, 1 cup (240 mL) 100% fruit juice. Eat more foods that contain soluble fiber. Examples include apples, broccoli, carrots, beans, peas, and barley. Aim to get 25-30 g of fiber per day. Increase your consumption of legumes, nuts, and seeds to 4-5 servings per week. One serving of dried beans or legumes equals  cup (90 g) cooked, 1 serving of nuts is  oz (12 almonds, 24 pistachios, or 7 walnut halves), and 1 serving of seeds equals  oz (8 g). Fats Choose healthy fats more often. Choose monounsaturated and polyunsaturated fats, such as olive and canola oils, avocado oil, flaxseeds, walnuts, almonds, and seeds. Eat  more omega-3 fats. Choose salmon, mackerel, sardines, tuna, flaxseed oil, and ground flaxseeds. Aim to eat fish at least 2 times each week. Check food labels carefully to identify foods with trans fats or high amounts of saturated fat. Limit saturated fats. These are found in animal products, such as meats, butter, and cream. Plant sources of saturated fats  include palm oil, palm kernel oil, and coconut oil. Avoid foods with partially hydrogenated oils in them. These contain trans fats. Examples are stick margarine, some tub margarines, cookies, crackers, and other baked goods. Avoid fried foods. General information Eat more home-cooked food and less restaurant, buffet, and fast food. Limit or avoid alcohol. Limit foods that are high in added sugar and simple starches such as foods made using white refined flour (white breads, pastries, sweets). Lose weight if you are overweight. Losing just 5-10% of your body weight can help your overall health and prevent diseases such as diabetes and heart disease. Monitor your sodium intake, especially if you have high blood pressure. Talk with your health care provider about your sodium intake. Try to incorporate more vegetarian meals weekly. What foods should I eat? Fruits All fresh, canned (in natural juice), or frozen fruits. Vegetables Fresh or frozen vegetables (raw, steamed, roasted, or grilled). Green salads. Grains Most grains. Choose whole wheat and whole grains most of the time. Rice and pasta, including brown rice and pastas made with whole wheat. Meats and other proteins Lean, well-trimmed beef, veal, pork, and lamb. Chicken and [place] without skin. All fish and shellfish. Wild duck, rabbit, pheasant, and venison. Egg whites or low-cholesterol egg substitutes. Dried beans, peas, lentils, and tofu. Seeds and most nuts. Dairy Low-fat or nonfat cheeses, including ricotta and mozzarella. Skim or 1% milk (liquid, powdered, or evaporated). Buttermilk made with low-fat milk. Nonfat or low-fat yogurt. Fats and oils Non-hydrogenated (trans-free) margarines. Vegetable oils, including soybean, sesame, sunflower, olive, avocado, peanut, safflower, corn, canola, and cottonseed. Salad dressings or mayonnaise made with a vegetable oil. Beverages Water (mineral or sparkling). Coffee and tea. Unsweetened ice  tea. Diet beverages. Sweets and desserts Sherbet, gelatin, and fruit ice. Small amounts of dark chocolate. Limit all sweets and desserts. Seasonings and condiments All seasonings and condiments. The items listed above may not be a complete list of foods and beverages you can eat. Contact a dietitian for more options. What foods should I avoid? Fruits Canned fruit in heavy syrup. Fruit in cream or butter sauce. Fried fruit. Limit coconut. Vegetables Vegetables cooked in cheese, cream, or butter sauce. Fried vegetables. Grains Breads made with saturated or trans fats, oils, or whole milk. Croissants. Sweet rolls. Donuts. High-fat crackers, such as cheese crackers and chips. Meats and other proteins Fatty meats, such as hot dogs, ribs, sausage, bacon, rib-eye roast or steak. High-fat deli meats, such as salami and bologna. Caviar. Domestic duck and goose. Organ meats, such as liver. Dairy Cream, sour cream, cream cheese, and creamed cottage cheese. Whole-milk cheeses. Whole or 2% milk (liquid, evaporated, or condensed). Whole buttermilk. Cream sauce or high-fat cheese sauce. Whole-milk yogurt. Fats and oils Meat fat, or shortening. Cocoa butter, hydrogenated oils, palm oil, coconut oil, palm kernel oil. Solid fats and shortenings, including bacon fat, salt pork, lard, and butter. Nondairy cream substitutes. Salad dressings with cheese or sour cream. Beverages Regular sodas and any drinks with added sugar. Sweets and desserts Frosting. Pudding. Cookies. Cakes. Pies. Milk chocolate or white chocolate. Buttered syrups. Full-fat ice cream or ice cream drinks. The items listed above may  not be a complete list of foods and beverages to avoid. Contact a dietitian for more information. Summary Heart-healthy meal planning includes limiting unhealthy fats, increasing healthy fats, limiting salt (sodium) intake and making other diet and lifestyle changes. Lose weight if you are overweight. Losing just  5-10% of your body weight can help your overall health and prevent diseases such as diabetes and heart disease. Focus on eating a balance of foods, including fruits and vegetables, low-fat or nonfat dairy, lean protein, nuts and legumes, whole grains, and heart-healthy oils and fats. This information is not intended to replace advice given to you by your health care provider. Make sure you discuss any questions you have with your health care provider. Document Revised: 07/22/2021 Document Reviewed: 07/22/2021 Elsevier Patient Education  2024 [employer].

## 2024-03-21 ENCOUNTER — Ambulatory Visit: Admitting: Nurse Practitioner

## 2024-03-21 ENCOUNTER — Encounter: Payer: Self-pay | Admitting: Nurse Practitioner

## 2024-03-21 VITALS — BP 128/68 | HR 48 | Temp 98.6°F | Resp 15 | Ht 66.5 in | Wt 150.9 lb

## 2024-03-21 DIAGNOSIS — I252 Old myocardial infarction: Secondary | ICD-10-CM

## 2024-03-21 DIAGNOSIS — E039 Hypothyroidism, unspecified: Secondary | ICD-10-CM

## 2024-03-21 DIAGNOSIS — Z23 Encounter for immunization: Secondary | ICD-10-CM | POA: Diagnosis not present

## 2024-03-21 DIAGNOSIS — I471 Supraventricular tachycardia, unspecified: Secondary | ICD-10-CM

## 2024-03-21 DIAGNOSIS — N1831 Chronic kidney disease, stage 3a: Secondary | ICD-10-CM | POA: Diagnosis not present

## 2024-03-21 DIAGNOSIS — I2510 Atherosclerotic heart disease of native coronary artery without angina pectoris: Secondary | ICD-10-CM

## 2024-03-21 DIAGNOSIS — I10 Essential (primary) hypertension: Secondary | ICD-10-CM

## 2024-03-21 DIAGNOSIS — D631 Anemia in chronic kidney disease: Secondary | ICD-10-CM

## 2024-03-21 DIAGNOSIS — Z0000 Encounter for general adult medical examination without abnormal findings: Secondary | ICD-10-CM | POA: Diagnosis not present

## 2024-03-21 DIAGNOSIS — R7301 Impaired fasting glucose: Secondary | ICD-10-CM

## 2024-03-21 DIAGNOSIS — E782 Mixed hyperlipidemia: Secondary | ICD-10-CM

## 2024-03-21 LAB — BAYER DCA HB A1C WAIVED: HB A1C (BAYER DCA - WAIVED): 5.8 % — ABNORMAL HIGH (ref 4.8–5.6)

## 2024-03-21 MED ORDER — LEVOTHYROXINE SODIUM 75 MCG PO TABS: 75.0000 ug | ORAL_TABLET | Freq: Every day | ORAL | 4 refills | Status: AC

## 2024-03-21 NOTE — Assessment & Plan Note (Signed)
 A1c trend up 5.8% today, trend down.  Continue diet and exercise focus at home.

## 2024-03-21 NOTE — Assessment & Plan Note (Signed)
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  Continue collaboration with cardiology and current medication regimen as prescribed by them.  Lipid panel today. 

## 2024-03-21 NOTE — Assessment & Plan Note (Signed)
Chronic, ongoing.  At this time continue current Levothyroxine dosing and adjust as needed based on labs.  Labs today.  Previously took Amiodarone.

## 2024-03-21 NOTE — Assessment & Plan Note (Signed)
Chronic, stable.  NSTEMI, (inferior myocardial infarction), on 12/25/20 with heart cath.  At this time continue current medication regimen and collaboration with cardiology.  Lipid panel today.

## 2024-03-21 NOTE — Assessment & Plan Note (Signed)
 Chronic, ongoing.  Followed by nephrology, continue this collaboration.  Recent notes reviewed.  Labs: CMP.

## 2024-03-21 NOTE — Assessment & Plan Note (Signed)
 Chronic, ongoing. BP well below goal in office for age on recheck and at goal on home checks.  Continue current medication regimen and adjust as needed. Recommend continue to monitor BP at home regularly and document for provider + focus on DASH diet.  Labs: CBC, TSH, CMP.  Return in 6 months, continue to collaborate with cardiology.

## 2024-03-21 NOTE — Progress Notes (Signed)
 Subjective:   Roger Byrd is a 83 y.o. male who presents for Medicare Annual/Subsequent preventive examination.  Visit Complete: In person  Patient Medicare AWV questionnaire was completed by the patient on 03/21/24 ; I have confirmed that all information answered by patient is correct and no changes since this date.  Cardiac Risk Factors include: advanced age (>73men, >70 women);hypertension;male gender;family history of premature cardiovascular disease     Objective:    Today's Vitals   03/21/24 1608 03/21/24 1627  BP: (!) 174/98 128/68  Pulse: (!) 48   Resp: 15   Temp: 98.6 F (37 C)   TempSrc: Oral   SpO2: 98%   Weight: 150 lb 14.4 oz (68.4 kg)   Height: 5' 6.5 (1.689 m)   PainSc: 0-No pain    Body mass index is 23.99 kg/m.     03/21/2024    4:11 PM 06/25/2022    3:21 PM 12/05/2021    1:03 PM 03/19/2021   10:14 AM 03/15/2021    5:49 PM 03/12/2021    7:58 AM 03/12/2021    2:36 AM  Advanced Directives  Does Patient Have a Medical Advance Directive? No No No No No  Yes  Type of Community education officer Power of Attorney   Does patient want to make changes to medical advance directive?      No - Patient declined   Copy of Healthcare Power of Attorney in Chart?      No - copy requested   Would patient like information on creating a medical advance directive? No - Patient declined  No - Patient declined        Current Medications (verified) Outpatient Encounter Medications as of 03/21/2024  Medication Sig   aspirin  81 MG tablet Take 81 mg by mouth every evening.    atorvastatin  (LIPITOR ) 80 MG tablet Take 1 tablet by mouth daily at 12 noon.   levothyroxine  (SYNTHROID ) 75 MCG tablet Take 1 tablet (75 mcg total) by mouth daily.   metoprolol  tartrate (LOPRESSOR ) 25 MG tablet Take 1 tablet (25 mg total) by mouth every 6 (six) hours as needed (for heart rate sustained >130 for 15 minutes and if systolic blood pressure (top number) is  >110).   valsartan (DIOVAN) 80 MG tablet Take 40 mg by mouth at bedtime.   No facility-administered encounter medications on file as of 03/21/2024.    Allergies (verified) Patient has no known allergies.   History: Past Medical History:  Diagnosis Date   Anxiety    Chronic kidney disease    Coronary artery disease    Dysrhythmia    Heart attack (HCC)    Hyperlipidemia    Hypertension    Hypothyroidism    Insomnia    Thyroid  disease    Past Surgical History:  Procedure Laterality Date   BACK SURGERY     x2   CARDIAC CATHETERIZATION     CARPAL TUNNEL RELEASE Right 03/08/2018   Procedure: CARPAL TUNNEL RELEASE;  Surgeon: Cleotilde Barrio, MD;  Location: [facility] ORS;  Service: Orthopedics;  Laterality: Right;   COLONOSCOPY WITH PROPOFOL  N/A 07/19/2015   Procedure: COLONOSCOPY WITH PROPOFOL ;  Surgeon: Lamar ONEIDA Holmes, MD;  Location: [facility] ENDOSCOPY;  Service: Endoscopy;  Laterality: N/A;   CORONARY ANGIOPLASTY     CORONARY STENT PLACEMENT     Family History  Problem Relation Age of Onset   Hypertension Mother    Heart disease Mother  heart attack   Hypertension Sister    Emphysema Father    Hypertension Daughter    Hypertension Son    Hypertension Sister    Hypertension Sister    Emphysema Sister    Social History   Socioeconomic History   Marital status: Widowed    Spouse name: Not on file   Number of children: Not on file   Years of education: 12   Highest education level: 12th grade  Occupational History   Occupation: retired   Tobacco Use   Smoking status: Former    Current packs/day: 0.00    Types: Cigarettes    Quit date: 08/08/1970    Years since quitting: 53.6   Smokeless tobacco: Never  Vaping Use   Vaping status: Never Used  Substance and Sexual Activity   Alcohol use: Not Currently   Drug use: No   Sexual activity: Not Currently  Other Topics Concern   Not on file  Social History Narrative   Not on file   Social Drivers of Health    Financial Resource Strain: Low Risk  (03/03/2023)   Overall Financial Resource Strain (CARDIA)    Difficulty of Paying Living Expenses: Not hard at all  Food Insecurity: No Food Insecurity (03/03/2023)   Hunger Vital Sign    Worried About Running Out of Food in the Last Year: Never true    Ran Out of Food in the Last Year: Never true  Transportation Needs: No Transportation Needs (03/03/2023)   PRAPARE - Administrator, Civil Service (Medical): No    Lack of Transportation (Non-Medical): No  Physical Activity: Sufficiently Active (03/03/2023)   Exercise Vital Sign    Days of Exercise per Week: 5 days    Minutes of Exercise per Session: 90 min  Stress: No Stress Concern Present (03/03/2023)   [employer] of Occupational Health - Occupational Stress Questionnaire    Feeling of Stress : Not at all  Social Connections: Moderately Integrated (03/03/2023)   Social Connection and Isolation Panel    Frequency of Communication with Friends and Family: More than three times a week    Frequency of Social Gatherings with Friends and Family: More than three times a week    Attends Religious Services: More than 4 times per year    Active Member of [employer] or Organizations: Yes    Attends Banker Meetings: More than 4 times per year    Marital Status: Widowed    Tobacco Counseling Counseling given: Not Answered   Clinical Intake:  Pre-visit preparation completed: No  Pain : No/denies pain Pain Score: 0-No pain     BMI - recorded: 23.99 Nutritional Status: BMI of 19-24  Normal Nutritional Risks: None Diabetes: No  How often do you need to have someone help you when you read instructions, pamphlets, or other written materials from your doctor or pharmacy?: 1 - Never  Interpreter Needed?: No      Activities of Daily Living    03/21/2024    4:09 PM  In your present state of health, do you have any difficulty performing the following activities:  Hearing? 0   Vision? 1  Comment Wears glasses  Difficulty concentrating or making decisions? 0  Walking or climbing stairs? 0  Dressing or bathing? 0  Doing errands, shopping? 0  Preparing Food and eating ? N  Using the Toilet? N  In the past six months, have you accidently leaked urine? N  Do you have problems with  loss of bowel control? N  Managing your Medications? N  Managing your Finances? N  Housekeeping or managing your Housekeeping? N    Patient Care Team: Cannady, Jolene T, NP as PCP - General (Nurse Practitioner) Lateef, Munsoor, MD (Nephrology) Rennie Cindy SAUNDERS, MD as Consulting Physician (Internal Medicine)  Indicate any recent Medical Services you may have received from other than [facility] providers in the past year (date may be approximate).     Assessment:   This is a routine wellness examination for Jonavon.  Hearing/Vision screen No results found.   Goals Addressed             This Visit's Progress    DIET - INCREASE WATER INTAKE   On track    Recommend drinking at least 6-8 glasses of water a day      Patient Stated   On track    12/03/2020, stay active     Patient Stated   On track    Stay active and healthy       Depression Screen    03/21/2024    4:07 PM 09/16/2023    3:53 PM 03/03/2023    2:46 PM 08/29/2022    3:21 PM 02/20/2022    8:28 AM 12/05/2021    1:10 PM 08/22/2021    8:42 AM  PHQ 2/9 Scores  PHQ - 2 Score 0 0 0 0 0 0 0  PHQ- 9 Score 0 0 0 0 0  0    Fall Risk    03/21/2024    4:07 PM 09/16/2023    3:53 PM 03/03/2023    2:46 PM 08/29/2022    3:20 PM 02/20/2022    8:27 AM  Fall Risk   Falls in the past year? 0 0 0 0 0  Number falls in past yr: 0 0 0 0 0  Injury with Fall? 0 0 0 0 0  Risk for fall due to : No Fall Risks No Fall Risks No Fall Risks No Fall Risks No Fall Risks  Follow up Falls evaluation completed Falls evaluation completed Falls evaluation completed Falls evaluation completed Falls evaluation completed      Data saved with a  previous flowsheet row definition    MEDICARE RISK AT HOME: Medicare Risk at Home Any stairs in or around the home?: Yes (Does not use) If so, are there any without handrails?: No Home free of loose throw rugs in walkways, pet beds, electrical cords, etc?: No Adequate lighting in your home to reduce risk of falls?: No Life alert?: No Use of a cane, walker or w/c?: No Grab bars in the bathroom?: No Shower chair or bench in shower?: No Elevated toilet seat or a handicapped toilet?: No  TIMED UP AND GO:  Was the test performed?  Yes  Length of time to ambulate 10 feet: 8 sec Gait steady and fast with assistive device    Cognitive Function:        03/21/2024    4:13 PM 03/03/2023    2:51 PM 12/05/2021    1:04 PM 12/03/2020    3:22 PM 05/28/2017    3:56 PM  6CIT Screen  What Year? 0 points 0 points 0 points 0 points 0 points  What month? 0 points 0 points 0 points 0 points 0 points  What time? 0 points 0 points 0 points 0 points 0 points  Count back from 20 0 points 0 points 0 points 0 points 0 points  Months in reverse 2  points 0 points 0 points 4 points 0 points  Repeat phrase 0 points 4 points 0 points 0 points 2 points  Total Score 2 points 4 points 0 points 4 points 2 points    Immunizations Immunization History  Administered Date(s) Administered   Fluad Quad(high Dose 65+) 05/10/2021, 06/05/2022   Fluad Trivalent(High Dose 65+) 03/03/2023   INFLUENZA, HIGH DOSE SEASONAL PF 05/14/2018, 04/19/2019, 03/21/2024   Influenza,inj,Quad PF,6+ Mos 04/24/2017   Influenza-Unspecified 03/18/2016, 04/24/2017, 06/19/2020   PFIZER(Purple Top)SARS-COV-2 Vaccination 08/08/2019, 08/29/2019, 06/19/2020   Pneumococcal Conjugate-13 04/10/2014   Pneumococcal Polysaccharide-23 01/02/2008   Td 12/20/2009   Tdap 08/09/2020   Zoster Recombinant(Shingrix ) 03/18/2022, 06/05/2022   Zoster, Live 01/02/2008    TDAP status: Up to date  Flu Vaccine status: Up to date  Pneumococcal vaccine  status: Up to date  Covid-19 vaccine status: Completed vaccines  Qualifies for Shingles Vaccine? Yes   Zostavax completed No   Shingrix  Completed?: Yes  Screening Tests Health Maintenance  Topic Date Due   Medicare Annual Wellness (AWV)  03/02/2024   COVID-19 Vaccine (4 - 2025-26 season) 04/04/2024 (Originally 02/29/2024)   DTaP/Tdap/Td (3 - Td or Tdap) 08/09/2030   Pneumococcal Vaccine: 50+ Years  Completed   Influenza Vaccine  Completed   Zoster Vaccines- Shingrix   Completed   HPV VACCINES  Aged Out   Meningococcal B Vaccine  Aged Out   Colonoscopy  Discontinued   Hepatitis C Screening  Discontinued    Health Maintenance  Health Maintenance Due  Topic Date Due   Medicare Annual Wellness (AWV)  03/02/2024    Colorectal cancer screening: No longer required.   Lung Cancer Screening: (Low Dose CT Chest recommended if Age 17-80 years, 20 pack-year currently smoking OR have quit w/in 15years.) does not qualify.    Additional Screening:  Hepatitis C Screening: does qualify; Completed 08/09/2020  Vision Screening: Recommended annual ophthalmology exams for early detection of glaucoma and other disorders of the eye. Is the patient up to date with their annual eye exam?  Yes   Dental Screening: Recommended annual dental exams for proper oral hygiene  Community Resource Referral / Chronic Care Management: CRR required this visit?  No   CCM required this visit?  No     Plan:     I have personally reviewed and noted the following in the patient's chart:   Medical and social history Use of alcohol, tobacco or illicit drugs  Current medications and supplements including opioid prescriptions. Patient is not currently taking opioid prescriptions. Functional ability and status Nutritional status Physical activity Advanced directives List of other physicians Hospitalizations, surgeries, and ER visits in previous 12 months Vitals Screenings to include cognitive,  depression, and falls Referrals and appointments  In addition, I have reviewed and discussed with patient certain preventive protocols, quality metrics, and best practice recommendations. A written personalized care plan for preventive services as well as general preventive health recommendations were provided to patient.     Comer HERO Estaban [place], CMA   03/21/2024   After Visit Summary: (In Person-Printed) AVS printed and given to the patient

## 2024-03-21 NOTE — Assessment & Plan Note (Signed)
Chronic, stable at this time with improved labs in August 2022.  Continue collaboration with hematology as needed only and nephrology as scheduled + current medication regimen.   

## 2024-03-21 NOTE — Progress Notes (Signed)
 BP 128/68 (BP Location: Left Arm, Patient Position: Sitting, Cuff Size: Normal)   Pulse (!) 48   Temp 98.6 F (37 C) (Oral)   Resp 15   Ht 5' 6.5 (1.689 m)   Wt 150 lb 14.4 oz (68.4 kg)   SpO2 98%   BMI 23.99 kg/m    Subjective:    Patient ID: Roger Byrd, male    DOB: 1940-07-12, 83 y.o.   MRN: [id number]  HPI: Roger Byrd is a 83 y.o. male presenting on 03/21/2024 for Medicare Wellness and annual exam. Current medical complaints include:none  He currently lives with: son Interim Problems from his last visit: no   HYPERTENSION / HYPERLIPIDEMIA Taking Valsartan, Metoprolol  PRN, ASA, Atorvastatin .  Follows with cardiology, last visit on 02/09/24 with no changes.  Has occasional hypotension, a full work-up was done with cardiology - no findings.  Goes to golf course 5 days a week and plays.  NSTEMI on 12/25/20 (inferior myocardial infarction), admitted to hospital and left heart cath performed - stent RCA 11/2020.   Satisfied with current treatment? yes Duration of hypertension: chronic BP monitoring frequency: daily BP range: 120-130/80 range on average BP medication side effects: no Duration of hyperlipidemia: chronic Cholesterol medication side effects: no Cholesterol supplements: none Medication compliance: good compliance Aspirin : yes Recent stressors: no Recurrent headaches: no Visual changes: no Palpitations: occasional, Metoprolol  helps this if needed Dyspnea: no Chest pain: no Lower extremity edema: no Dizzy/lightheaded: no   Impaired Fasting Glucose HbA1C:  Lab Results  Component Value Date   HGBA1C 5.8 (H) 03/21/2024  Duration of elevated blood sugar:  Polydipsia: no Polyuria: no Weight change: no Visual disturbance: no Glucose Monitoring: no    Accucheck frequency: Not Checking    Fasting glucose:     Post prandial:  Diabetic Education: Not Completed Family history of diabetes: no   CHRONIC KIDNEY DISEASE Nephrology last seen 12/16/23, CRT  1.29 and eGFR 55.  Hematology seen in past for anemia, to return as needed. CKD status: stable Medications renally dose: yes Previous renal evaluation: yes Pneumovax:  Up to Date Influenza Vaccine:  Up to Date  HYPOTHYROIDISM Takes Levothyroxine  75 MCG. Thyroid  control status:stable Satisfied with current treatment? yes Medication side effects: no Medication compliance: good compliance Etiology of hypothyroidism:  Recent dose adjustment:no Fatigue: no Cold intolerance: sometimes Heat intolerance: no Weight gain: no Weight loss: no Constipation: occasional Diarrhea/loose stools: no Palpitations: no Lower extremity edema: no Anxiety/depressed mood: no   Functional Status Survey: Is the patient deaf or have difficulty hearing?: No Does the patient have difficulty seeing, even when wearing glasses/contacts?: Yes (Wears glasses) Does the patient have difficulty concentrating, remembering, or making decisions?: No Does the patient have difficulty walking or climbing stairs?: No Does the patient have difficulty dressing or bathing?: No Does the patient have difficulty doing errands alone such as visiting a doctor's office or shopping?: No  FALL RISK:    03/21/2024    4:07 PM 09/16/2023    3:53 PM 03/03/2023    2:46 PM 08/29/2022    3:20 PM 02/20/2022    8:27 AM  Fall Risk   Falls in the past year? 0 0 0 0 0  Number falls in past yr: 0 0 0 0 0  Injury with Fall? 0 0 0 0 0  Risk for fall due to : No Fall Risks No Fall Risks No Fall Risks No Fall Risks No Fall Risks  Follow up Falls evaluation completed Falls evaluation completed  Falls evaluation completed Falls evaluation completed Falls evaluation completed      Data saved with a previous flowsheet row definition    Depression Screen    03/21/2024    4:07 PM 09/16/2023    3:53 PM 03/03/2023    2:46 PM 08/29/2022    3:21 PM 02/20/2022    8:28 AM  Depression screen PHQ 2/9  Decreased Interest 0 0 0 0 0  Down, Depressed, Hopeless  0 0 0 0 0  PHQ - 2 Score 0 0 0 0 0  Altered sleeping 0 0 0 0 0  Tired, decreased energy 0 0 0 0 0  Change in appetite 0 0 0 0 0  Feeling bad or failure about yourself  0 0 0 0 0  Trouble concentrating 0 0 0 0 0  Moving slowly or fidgety/restless 0 0 0 0 0  Suicidal thoughts 0 0 0 0 0  PHQ-9 Score 0 0 0 0 0  Difficult doing work/chores  Not difficult at all Not difficult at all Not difficult at all Not difficult at all      03/21/2024    4:08 PM 09/16/2023    3:53 PM 03/03/2023    2:46 PM 08/29/2022    3:21 PM  GAD 7 : Generalized Anxiety Score  Nervous, Anxious, on Edge 0 0 0 0  Control/stop worrying 0 0 0 0  Worry too much - different things 0 0 0 0  Trouble relaxing 0 0 0 0  Restless 0 0 0 0  Easily annoyed or irritable 0 0 0 0  Afraid - awful might happen 0 0 0 0  Total GAD 7 Score 0 0 0 0  Anxiety Difficulty  Not difficult at all Not difficult at all Not difficult at all   Past Medical History:  Past Medical History:  Diagnosis Date   Anxiety    Chronic kidney disease    Coronary artery disease    Dysrhythmia    Heart attack (HCC)    Hyperlipidemia    Hypertension    Hypothyroidism    Insomnia    Thyroid  disease     Surgical History:  Past Surgical History:  Procedure Laterality Date   BACK SURGERY     x2   CARDIAC CATHETERIZATION     CARPAL TUNNEL RELEASE Right 03/08/2018   Procedure: CARPAL TUNNEL RELEASE;  Surgeon: Cleotilde Barrio, MD;  Location: [facility] ORS;  Service: Orthopedics;  Laterality: Right;   COLONOSCOPY WITH PROPOFOL  N/A 07/19/2015   Procedure: COLONOSCOPY WITH PROPOFOL ;  Surgeon: Lamar ONEIDA Holmes, MD;  Location: [facility]COPY;  Service: Endoscopy;  Laterality: N/A;   CORONARY ANGIOPLASTY     CORONARY STENT PLACEMENT      Medications:  Current Outpatient Medications on File Prior to Visit  Medication Sig   aspirin  81 MG tablet Take 81 mg by mouth every evening.    atorvastatin  (LIPITOR ) 80 MG tablet Take 1 tablet by mouth daily at 12 noon.    metoprolol  tartrate (LOPRESSOR ) 25 MG tablet Take 1 tablet (25 mg total) by mouth every 6 (six) hours as needed (for heart rate sustained >130 for 15 minutes and if systolic blood pressure (top number) is >110).   valsartan (DIOVAN) 80 MG tablet Take 40 mg by mouth at bedtime.   No current facility-administered medications on file prior to visit.    Allergies:  No Known Allergies  Social History:  Social History   Socioeconomic History   Marital status: Widowed  Spouse name: Not on file   Number of children: Not on file   Years of education: 12   Highest education level: 12th grade  Occupational History   Occupation: retired   Tobacco Use   Smoking status: Former    Current packs/day: 0.00    Types: Cigarettes    Quit date: 08/08/1970    Years since quitting: 53.6   Smokeless tobacco: Never  Vaping Use   Vaping status: Never Used  Substance and Sexual Activity   Alcohol use: Not Currently   Drug use: No   Sexual activity: Not Currently  Other Topics Concern   Not on file  Social History Narrative   Not on file   Social Drivers of Health   Financial Resource Strain: Low Risk  (03/03/2023)   Overall Financial Resource Strain (CARDIA)    Difficulty of Paying Living Expenses: Not hard at all  Food Insecurity: No Food Insecurity (03/03/2023)   Hunger Vital Sign    Worried About Running Out of Food in the Last Year: Never true    Ran Out of Food in the Last Year: Never true  Transportation Needs: No Transportation Needs (03/03/2023)   PRAPARE - Administrator, Civil Service (Medical): No    Lack of Transportation (Non-Medical): No  Physical Activity: Sufficiently Active (03/03/2023)   Exercise Vital Sign    Days of Exercise per Week: 5 days    Minutes of Exercise per Session: 90 min  Stress: No Stress Concern Present (03/03/2023)   [employer] of Occupational Health - Occupational Stress Questionnaire    Feeling of Stress : Not at all  Social Connections:  Moderately Integrated (03/03/2023)   Social Connection and Isolation Panel    Frequency of Communication with Friends and Family: More than three times a week    Frequency of Social Gatherings with Friends and Family: More than three times a week    Attends Religious Services: More than 4 times per year    Active Member of [employer] or Organizations: Yes    Attends Banker Meetings: More than 4 times per year    Marital Status: Widowed  Intimate Partner Violence: Not At Risk (03/03/2023)   Humiliation, Afraid, Rape, and Kick questionnaire    Fear of Current or Ex-Partner: No    Emotionally Abused: No    Physically Abused: No    Sexually Abused: No   Social History   Tobacco Use  Smoking Status Former   Current packs/day: 0.00   Types: Cigarettes   Quit date: 08/08/1970   Years since quitting: 53.6  Smokeless Tobacco Never   Social History   Substance and Sexual Activity  Alcohol Use Not Currently    Family History:  Family History  Problem Relation Age of Onset   Hypertension Mother    Heart disease Mother        heart attack   Hypertension Sister    Emphysema Father    Hypertension Daughter    Hypertension Son    Hypertension Sister    Hypertension Sister    Emphysema Sister     Past medical history, surgical history, medications, allergies, family history and social history reviewed with patient today and changes made to appropriate areas of the chart.   Review of Systems - negative All other ROS negative except what is listed above and in the HPI.      Objective:    BP 128/68 (BP Location: Left Arm, Patient Position: Sitting, Cuff Size:  Normal)   Pulse (!) 48   Temp 98.6 F (37 C) (Oral)   Resp 15   Ht 5' 6.5 (1.689 m)   Wt 150 lb 14.4 oz (68.4 kg)   SpO2 98%   BMI 23.99 kg/m   Wt Readings from Last 3 Encounters:  03/21/24 150 lb 14.4 oz (68.4 kg)  09/16/23 149 lb 3.2 oz (67.7 kg)  03/03/23 149 lb 9.6 oz (67.9 kg)    Physical Exam Vitals  and nursing note reviewed.  Constitutional:      General: He is awake. He is not in acute distress.    Appearance: He is well-developed and well-groomed. He is not ill-appearing.  HENT:     Head: Normocephalic and atraumatic.     Right Ear: Hearing, tympanic membrane, ear canal and external ear normal. No drainage.     Left Ear: Hearing, tympanic membrane, ear canal and external ear normal. No drainage.     Nose: Nose normal.     Mouth/Throat:     Pharynx: Uvula midline.  Eyes:     General: Lids are normal.        Right eye: No discharge.        Left eye: No discharge.     Extraocular Movements: Extraocular movements intact.     Conjunctiva/sclera: Conjunctivae normal.     Pupils: Pupils are equal, round, and reactive to light.     Visual Fields: Right eye visual fields normal and left eye visual fields normal.  Neck:     Thyroid : No thyromegaly.     Vascular: No carotid bruit or JVD.     Trachea: Trachea normal.  Cardiovascular:     Rate and Rhythm: Normal rate and regular rhythm.     Heart sounds: Normal heart sounds, S1 normal and S2 normal. No murmur heard.    No gallop.  Pulmonary:     Effort: Pulmonary effort is normal. No accessory muscle usage or respiratory distress.     Breath sounds: Normal breath sounds.  Abdominal:     General: Bowel sounds are normal.     Palpations: Abdomen is soft. There is no hepatomegaly or splenomegaly.     Tenderness: There is no abdominal tenderness.  Genitourinary:    Comments: Deferred per patient request Musculoskeletal:        General: Normal range of motion.     Cervical back: Normal range of motion and neck supple.     Right lower leg: No edema.     Left lower leg: No edema.  Lymphadenopathy:     Head:     Right side of head: No submental, submandibular, tonsillar, preauricular or posterior auricular adenopathy.     Left side of head: No submental, submandibular, tonsillar, preauricular or posterior auricular adenopathy.      Cervical: No cervical adenopathy.  Skin:    General: Skin is warm and dry.     Capillary Refill: Capillary refill takes less than 2 seconds.     Findings: No rash.  Neurological:     Mental Status: He is alert and oriented to person, place, and time.     Gait: Gait is intact.     Deep Tendon Reflexes: Reflexes are normal and symmetric.     Reflex Scores:      Brachioradialis reflexes are 2+ on the right side and 2+ on the left side.      Patellar reflexes are 2+ on the right side and 2+ on the left side. Psychiatric:  Attention and Perception: Attention normal.        Mood and Affect: Mood normal.        Speech: Speech normal.        Behavior: Behavior normal. Behavior is cooperative.        Thought Content: Thought content normal.        Cognition and Memory: Cognition normal.        Judgment: Judgment normal.       03/21/2024    4:13 PM 03/03/2023    2:51 PM 12/05/2021    1:04 PM 12/03/2020    3:22 PM 05/28/2017    3:56 PM  6CIT Screen  What Year? 0 points 0 points 0 points 0 points 0 points  What month? 0 points 0 points 0 points 0 points 0 points  What time? 0 points 0 points 0 points 0 points 0 points  Count back from 20 0 points 0 points 0 points 0 points 0 points  Months in reverse 2 points 0 points 0 points 4 points 0 points  Repeat phrase 0 points 4 points 0 points 0 points 2 points  Total Score 2 points 4 points 0 points 4 points 2 points   Results for orders placed or performed in visit on 03/21/24  Bayer DCA Hb A1c Waived   Collection Time: 03/21/24  4:00 PM  Result Value Ref Range   HB A1C (BAYER DCA - WAIVED) 5.8 (H) 4.8 - 5.6 %      Assessment & Plan:   Problem List Items Addressed This Visit       Cardiovascular and Mediastinum   SVT (supraventricular tachycardia)   Chronic, stable at this time with no symptoms.  Continue collaboration with cardiology.  Recent notes reviewed.  Recommend continue no caffeine use and increased calcium .       Hypertension   Chronic, ongoing. BP well below goal in office for age on recheck and at goal on home checks.  Continue current medication regimen and adjust as needed. Recommend continue to monitor BP at home regularly and document for provider + focus on DASH diet.  Labs: CBC, TSH, CMP.  Return in 6 months, continue to collaborate with cardiology.      Relevant Orders   CBC with Differential/Platelet   Comprehensive metabolic panel with GFR   Coronary artery disease involving native coronary artery of native heart   Chronic, stable.  NSTEMI, (inferior myocardial infarction), on 12/25/20 with heart cath.  At this time continue current medication regimen and collaboration with cardiology.  Lipid panel today.        Endocrine   IFG (impaired fasting glucose)   A1c trend up 5.8% today, trend down.  Continue diet and exercise focus at home.      Relevant Orders   Bayer DCA Hb A1c Waived (Completed)   Hypothyroidism   Chronic, ongoing.  At this time continue current Levothyroxine  dosing and adjust as needed based on labs.  Labs today.  Previously took Amiodarone .      Relevant Medications   levothyroxine  (SYNTHROID ) 75 MCG tablet   Other Relevant Orders   TSH   T4, free     Genitourinary   Chronic kidney disease (CKD), stage III (moderate) (HCC)   Chronic, ongoing.  Followed by nephrology, continue this collaboration.  Recent notes reviewed.  Labs: CMP.      Relevant Orders   CBC with Differential/Platelet   Comprehensive metabolic panel with GFR   Anemia due to stage 3a chronic  kidney disease (HCC)   Chronic, stable at this time with improved labs in August 2022.  Continue collaboration with hematology as needed only and nephrology as scheduled + current medication regimen.          Other   Hyperlipidemia   Chronic, ongoing.  Continue current medication regimen and adjust as needed.  Lipid panel today.      Relevant Orders   Lipid Panel w/o Chol/HDL Ratio   Comprehensive  metabolic panel with GFR   History of non-ST elevation myocardial infarction (NSTEMI)   On 12/25/20, (inferior myocardial infarction).  Continue collaboration with cardiology and current medication regimen as prescribed by them.  Lipid panel today.      Other Visit Diagnoses       Encounter for Medicare annual wellness exam    -  Primary   Medicare Wellness today     Flu vaccine need       Flu vaccine today, educated patient.   Relevant Orders   Flu vaccine HIGH DOSE PF(Fluzone Trivalent) (Completed)     Encounter for annual physical exam       Annual physical today with labs and health maintenance reviewed, discussed with patient.       Discussed aspirin  prophylaxis for myocardial infarction prevention and decision was made to continue ASA  LABORATORY TESTING:  Health maintenance labs ordered today as discussed above.   The natural history of prostate cancer and ongoing controversy regarding screening and potential treatment outcomes of prostate cancer has been discussed with the patient. The meaning of a false positive PSA and a false negative PSA has been discussed. He indicates understanding of the limitations of this screening test and wishes not to proceed with screening PSA testing.   IMMUNIZATIONS:   - Tdap: Tetanus vaccination status reviewed: Up To Date - Influenza: Up to date - provided today - Pneumovax: Up to date - Prevnar: Up to date - Zostavax vaccine: Up To Date  SCREENING: - Colonoscopy: Not applicable Discussed with patient purpose of the colonoscopy is to detect colon cancer at curable precancerous or early stages   - AAA Screening: Not applicable  -Hearing Test: Not applicable  -Spirometry: Not applicable   PATIENT COUNSELING:    Sexuality: Discussed sexually transmitted diseases, partner selection, use of condoms, avoidance of unintended pregnancy  and contraceptive alternatives.   Advised to avoid cigarette smoking.  I discussed with the patient  that most people either abstain from alcohol or drink within safe limits (<=14/week and <=4 drinks/occasion for males, <=7/weeks and <= 3 drinks/occasion for females) and that the risk for alcohol disorders and other health effects rises proportionally with the number of drinks per week and how often a drinker exceeds daily limits.  Discussed cessation/primary prevention of drug use and availability of treatment for abuse.   Diet: Encouraged to adjust caloric intake to maintain  or achieve ideal body weight, to reduce intake of dietary saturated fat and total fat, to limit sodium intake by avoiding high sodium foods and not adding table salt, and to maintain adequate dietary potassium and calcium  preferably from fresh fruits, vegetables, and low-fat dairy products.    Stressed the importance of regular exercise  Injury prevention: Discussed safety belts, safety helmets, smoke detector, smoking near bedding or upholstery.   Dental health: Discussed importance of regular tooth brushing, flossing, and dental visits.   Follow up plan: NEXT PREVENTATIVE PHYSICAL DUE IN 1 YEAR. Return in 6 months (on 09/18/2024) for HTN/HLD, Hypothyroid, CKD.

## 2024-03-21 NOTE — Assessment & Plan Note (Signed)
 Chronic, ongoing.  Continue current medication regimen and adjust as needed. Lipid panel today.

## 2024-03-21 NOTE — Assessment & Plan Note (Signed)
Chronic, stable at this time with no symptoms.  Continue collaboration with cardiology.  Recent notes reviewed.  Recommend continue no caffeine use and increased calcium.

## 2024-03-22 ENCOUNTER — Ambulatory Visit: Payer: Self-pay | Admitting: Nurse Practitioner

## 2024-03-22 LAB — COMPREHENSIVE METABOLIC PANEL WITH GFR
ALT: 19 IU/L (ref 0–44)
AST: 24 IU/L (ref 0–40)
Albumin: 4.3 g/dL (ref 3.7–4.7)
Alkaline Phosphatase: 60 IU/L (ref 48–129)
BUN/Creatinine Ratio: 19 (ref 10–24)
BUN: 25 mg/dL (ref 8–27)
Bilirubin Total: 1 mg/dL (ref 0.0–1.2)
CO2: 19 mmol/L — ABNORMAL LOW (ref 20–29)
Calcium: 9 mg/dL (ref 8.6–10.2)
Chloride: 103 mmol/L (ref 96–106)
Creatinine, Ser: 1.29 mg/dL — ABNORMAL HIGH (ref 0.76–1.27)
Globulin, Total: 2.1 g/dL (ref 1.5–4.5)
Glucose: 79 mg/dL (ref 70–99)
Potassium: 4.4 mmol/L (ref 3.5–5.2)
Sodium: 138 mmol/L (ref 134–144)
Total Protein: 6.4 g/dL (ref 6.0–8.5)
eGFR: 55 mL/min/1.73 — ABNORMAL LOW (ref >59)

## 2024-03-22 LAB — CBC WITH DIFFERENTIAL/PLATELET
Basophils Absolute: 0.1 x10E3/uL (ref 0.0–0.2)
Basos: 1 %
EOS (ABSOLUTE): 0.2 x10E3/uL (ref 0.0–0.4)
Eos: 3 %
Hematocrit: 42 % (ref 37.5–51.0)
Hemoglobin: 14 g/dL (ref 13.0–17.7)
Immature Grans (Abs): 0 x10E3/uL (ref 0.0–0.1)
Immature Granulocytes: 0 %
Lymphocytes Absolute: 2.2 x10E3/uL (ref 0.7–3.1)
Lymphs: 25 %
MCH: 27.2 pg (ref 26.6–33.0)
MCHC: 33.3 g/dL (ref 31.5–35.7)
MCV: 82 fL (ref 79–97)
Monocytes Absolute: 0.7 x10E3/uL (ref 0.1–0.9)
Monocytes: 8 %
Neutrophils Absolute: 5.8 x10E3/uL (ref 1.4–7.0)
Neutrophils: 63 %
Platelets: 310 x10E3/uL (ref 150–450)
RBC: 5.15 x10E6/uL (ref 4.14–5.80)
RDW: 13.1 % (ref 11.6–15.4)
WBC: 9.1 x10E3/uL (ref 3.4–10.8)

## 2024-03-22 LAB — LIPID PANEL W/O CHOL/HDL RATIO
Cholesterol, Total: 143 mg/dL (ref 100–199)
HDL: 45 mg/dL (ref >39)
LDL Chol Calc (NIH): 81 mg/dL (ref 0–99)
Triglycerides: 91 mg/dL (ref 0–149)
VLDL Cholesterol Cal: 17 mg/dL (ref 5–40)

## 2024-03-22 LAB — TSH: TSH: 2.4 u[IU]/mL (ref 0.450–4.500)

## 2024-03-22 LAB — T4, FREE: Free T4: 1.32 ng/dL (ref 0.82–1.77)

## 2024-03-22 NOTE — Progress Notes (Signed)
 Contacted via MyChart  Good afternoon Roger Byrd, your labs have returned and remain stable and at baseline for you.  No changes needed.  Any questions? Keep being amazing!!  Thank you for allowing me to participate in your care.  I appreciate you. Kindest regards, Jamielyn Petrucci

## 2024-09-19 ENCOUNTER — Ambulatory Visit: Admitting: Nurse Practitioner
